# Patient Record
Sex: Male | Born: 1965 | Race: White | Hispanic: No | Marital: Married | Smoking: Former smoker
Health system: Southern US, Community
[De-identification: ages and names within clinical notes are randomized; demographics above are authoritative.]

## PROBLEM LIST (undated history)

## (undated) DIAGNOSIS — I509 Heart failure, unspecified: Secondary | ICD-10-CM

## (undated) DIAGNOSIS — I10 Essential (primary) hypertension: Secondary | ICD-10-CM

## (undated) DIAGNOSIS — E785 Hyperlipidemia, unspecified: Secondary | ICD-10-CM

## (undated) DIAGNOSIS — E119 Type 2 diabetes mellitus without complications: Secondary | ICD-10-CM

## (undated) HISTORY — DX: Type 2 diabetes mellitus without complications: E11.9

## (undated) HISTORY — DX: Heart failure, unspecified: I50.9

## (undated) HISTORY — DX: Hyperlipidemia, unspecified: E78.5

## (undated) HISTORY — PX: NO PAST SURGERIES: SHX2092

---

## 2010-09-14 ENCOUNTER — Emergency Department (HOSPITAL_COMMUNITY)
Admission: EM | Admit: 2010-09-14 | Discharge: 2010-09-14 | Disposition: A | Discharge status: Home / Self Care | Payer: BC Managed Care – PPO | Attending: Emergency Medicine | Admitting: Emergency Medicine

## 2010-09-14 DIAGNOSIS — I498 Other specified cardiac arrhythmias: Secondary | ICD-10-CM | POA: Insufficient documentation

## 2010-09-14 DIAGNOSIS — R42 Dizziness and giddiness: Secondary | ICD-10-CM | POA: Insufficient documentation

## 2010-09-14 DIAGNOSIS — R55 Syncope and collapse: Secondary | ICD-10-CM | POA: Insufficient documentation

## 2010-09-14 LAB — CBC
Platelets: 185 10*3/uL (ref 150–400)
RDW: 11.5 % (ref 11.5–15.5)
WBC: 9.8 10*3/uL (ref 4.0–10.5)

## 2010-09-14 LAB — DIFFERENTIAL
Basophils Absolute: 0 10*3/uL (ref 0.0–0.1)
Basophils Relative: 0 % (ref 0–1)
Eosinophils Absolute: 0.1 10*3/uL (ref 0.0–0.7)
Eosinophils Relative: 1 % (ref 0–5)
Lymphocytes Relative: 17 % (ref 12–46)

## 2010-09-14 LAB — BASIC METABOLIC PANEL
Chloride: 101 mEq/L (ref 96–112)
GFR calc Af Amer: >60 mL/min (ref >60)
GFR calc non Af Amer: >60 mL/min (ref >60)
Potassium: 3.9 mEq/L (ref 3.5–5.1)
Sodium: 138 mEq/L (ref 135–145)

## 2014-03-21 ENCOUNTER — Emergency Department (HOSPITAL_COMMUNITY): Payer: BC Managed Care – PPO

## 2014-03-21 ENCOUNTER — Inpatient Hospital Stay (HOSPITAL_COMMUNITY)
Admission: EM | Admit: 2014-03-21 | Discharge: 2014-03-25 | DRG: 175 | Disposition: A | Discharge status: Home / Self Care | Payer: BC Managed Care – PPO | Attending: Internal Medicine | Admitting: Internal Medicine

## 2014-03-21 ENCOUNTER — Encounter (HOSPITAL_COMMUNITY): Payer: Self-pay | Admitting: *Deleted

## 2014-03-21 DIAGNOSIS — R140 Abdominal distension (gaseous): Secondary | ICD-10-CM

## 2014-03-21 DIAGNOSIS — F17210 Nicotine dependence, cigarettes, uncomplicated: Secondary | ICD-10-CM | POA: Diagnosis present

## 2014-03-21 DIAGNOSIS — I429 Cardiomyopathy, unspecified: Secondary | ICD-10-CM | POA: Diagnosis present

## 2014-03-21 DIAGNOSIS — I2699 Other pulmonary embolism without acute cor pulmonale: Secondary | ICD-10-CM | POA: Diagnosis present

## 2014-03-21 DIAGNOSIS — E1165 Type 2 diabetes mellitus with hyperglycemia: Secondary | ICD-10-CM | POA: Diagnosis present

## 2014-03-21 DIAGNOSIS — R000 Tachycardia, unspecified: Secondary | ICD-10-CM | POA: Diagnosis present

## 2014-03-21 DIAGNOSIS — I50810 Right heart failure, unspecified: Secondary | ICD-10-CM

## 2014-03-21 DIAGNOSIS — IMO0001 Reserved for inherently not codable concepts without codable children: Secondary | ICD-10-CM | POA: Diagnosis present

## 2014-03-21 DIAGNOSIS — E119 Type 2 diabetes mellitus without complications: Secondary | ICD-10-CM

## 2014-03-21 DIAGNOSIS — I5021 Acute systolic (congestive) heart failure: Secondary | ICD-10-CM | POA: Diagnosis present

## 2014-03-21 DIAGNOSIS — F17200 Nicotine dependence, unspecified, uncomplicated: Secondary | ICD-10-CM | POA: Diagnosis present

## 2014-03-21 DIAGNOSIS — F1010 Alcohol abuse, uncomplicated: Secondary | ICD-10-CM | POA: Diagnosis present

## 2014-03-21 DIAGNOSIS — I10 Essential (primary) hypertension: Secondary | ICD-10-CM | POA: Diagnosis present

## 2014-03-21 DIAGNOSIS — R0602 Shortness of breath: Secondary | ICD-10-CM | POA: Diagnosis not present

## 2014-03-21 DIAGNOSIS — R600 Localized edema: Secondary | ICD-10-CM

## 2014-03-21 DIAGNOSIS — R042 Hemoptysis: Secondary | ICD-10-CM | POA: Diagnosis present

## 2014-03-21 DIAGNOSIS — Z86711 Personal history of pulmonary embolism: Secondary | ICD-10-CM | POA: Diagnosis present

## 2014-03-21 DIAGNOSIS — J90 Pleural effusion, not elsewhere classified: Secondary | ICD-10-CM

## 2014-03-21 HISTORY — DX: Essential (primary) hypertension: I10

## 2014-03-21 LAB — HEPATIC FUNCTION PANEL
ALBUMIN: 3.6 g/dL (ref 3.5–5.2)
ALK PHOS: 104 U/L (ref 39–117)
ALT: 38 U/L (ref 0–53)
AST: 34 U/L (ref 0–37)
BILIRUBIN INDIRECT: 0.9 mg/dL (ref 0.3–0.9)
Bilirubin, Direct: 0.6 mg/dL — ABNORMAL HIGH (ref 0.0–0.3)
TOTAL PROTEIN: 6.9 g/dL (ref 6.0–8.3)
Total Bilirubin: 1.5 mg/dL — ABNORMAL HIGH (ref 0.3–1.2)

## 2014-03-21 LAB — CBC
HCT: 46.5 % (ref 39.0–52.0)
Hemoglobin: 15.9 g/dL (ref 13.0–17.0)
MCH: 32 pg (ref 26.0–34.0)
MCHC: 34.2 g/dL (ref 30.0–36.0)
MCV: 93.6 fL (ref 78.0–100.0)
PLATELETS: 178 10*3/uL (ref 150–400)
RBC: 4.97 MIL/uL (ref 4.22–5.81)
RDW: 11.9 % (ref 11.5–15.5)
WBC: 8.9 10*3/uL (ref 4.0–10.5)

## 2014-03-21 LAB — GLUCOSE, CAPILLARY: Glucose-Capillary: 159 mg/dL — ABNORMAL HIGH (ref 70–99)

## 2014-03-21 LAB — BASIC METABOLIC PANEL
ANION GAP: 11 (ref 5–15)
BUN: 9 mg/dL (ref 6–23)
CALCIUM: 9.8 mg/dL (ref 8.4–10.5)
CO2: 25 mmol/L (ref 19–32)
CREATININE: 1.14 mg/dL (ref 0.50–1.35)
Chloride: 95 mEq/L — ABNORMAL LOW (ref 96–112)
GFR calc Af Amer: 85 mL/min — ABNORMAL LOW (ref >90)
GFR, EST NON AFRICAN AMERICAN: 73 mL/min — AB (ref >90)
Glucose, Bld: 238 mg/dL — ABNORMAL HIGH (ref 70–99)
Potassium: 3.8 mmol/L (ref 3.5–5.1)
Sodium: 131 mmol/L — ABNORMAL LOW (ref 135–145)

## 2014-03-21 LAB — ANTITHROMBIN III: AntiThromb III Func: 79 % (ref 75–120)

## 2014-03-21 LAB — I-STAT TROPONIN, ED: Troponin i, poc: 0.01 ng/mL (ref 0.00–0.08)

## 2014-03-21 LAB — BRAIN NATRIURETIC PEPTIDE: B Natriuretic Peptide: 987.2 pg/mL — ABNORMAL HIGH (ref 0.0–100.0)

## 2014-03-21 MED ORDER — LORAZEPAM 2 MG/ML IJ SOLN: 1.0000 mg | Freq: Four times a day (QID) | INTRAMUSCULAR | Status: DC | PRN

## 2014-03-21 MED ORDER — VITAMIN B-1 100 MG PO TABS
100.0000 mg | ORAL_TABLET | Freq: Every day | ORAL | Status: DC
  Administered 2014-03-24 – 2014-03-25 (×2): 100 mg via ORAL
  Filled 2014-03-21 (×4): qty 1

## 2014-03-21 MED ORDER — LORAZEPAM 2 MG/ML IJ SOLN
0.0000 mg | Freq: Two times a day (BID) | INTRAMUSCULAR | Status: DC
Start: 2014-03-23 — End: 2014-03-24

## 2014-03-21 MED ORDER — AMLODIPINE BESYLATE 5 MG PO TABS
5.0000 mg | ORAL_TABLET | Freq: Every day | ORAL | Status: DC
  Administered 2014-03-22: 5 mg via ORAL
  Filled 2014-03-21 (×2): qty 1

## 2014-03-21 MED ORDER — ADULT MULTIVITAMIN W/MINERALS CH
1.0000 | ORAL_TABLET | Freq: Every day | ORAL | Status: DC
  Administered 2014-03-22 – 2014-03-25 (×4): 1 via ORAL
  Filled 2014-03-21 (×4): qty 1

## 2014-03-21 MED ORDER — IOHEXOL 350 MG/ML SOLN
100.0000 mL | Freq: Once | INTRAVENOUS | Status: AC | PRN
  Administered 2014-03-21: 100 mL via INTRAVENOUS

## 2014-03-21 MED ORDER — THIAMINE HCL 100 MG/ML IJ SOLN
100.0000 mg | Freq: Every day | INTRAMUSCULAR | Status: DC
  Administered 2014-03-22 – 2014-03-23 (×2): 100 mg via INTRAVENOUS
  Filled 2014-03-21 (×4): qty 1

## 2014-03-21 MED ORDER — PANTOPRAZOLE SODIUM 40 MG PO TBEC
40.0000 mg | DELAYED_RELEASE_TABLET | Freq: Every day | ORAL | Status: DC
  Administered 2014-03-22 – 2014-03-25 (×4): 40 mg via ORAL
  Filled 2014-03-21 (×3): qty 1

## 2014-03-21 MED ORDER — LORAZEPAM 1 MG PO TABS
1.0000 mg | ORAL_TABLET | Freq: Four times a day (QID) | ORAL | Status: DC | PRN
  Administered 2014-03-21 – 2014-03-24 (×4): 1 mg via ORAL
  Filled 2014-03-21 (×4): qty 1

## 2014-03-21 MED ORDER — NICOTINE 14 MG/24HR TD PT24
14.0000 mg | MEDICATED_PATCH | Freq: Every day | TRANSDERMAL | Status: DC
  Filled 2014-03-21 (×5): qty 1

## 2014-03-21 MED ORDER — INSULIN ASPART 100 UNIT/ML ~~LOC~~ SOLN
0.0000 [IU] | Freq: Three times a day (TID) | SUBCUTANEOUS | Status: DC
  Administered 2014-03-22 – 2014-03-23 (×3): 2 [IU] via SUBCUTANEOUS
  Administered 2014-03-23 – 2014-03-24 (×2): 1 [IU] via SUBCUTANEOUS
  Administered 2014-03-24: 3 [IU] via SUBCUTANEOUS
  Administered 2014-03-24: 2 [IU] via SUBCUTANEOUS
  Administered 2014-03-25: 3 [IU] via SUBCUTANEOUS
  Administered 2014-03-25: 1 [IU] via SUBCUTANEOUS

## 2014-03-21 MED ORDER — HEPARIN (PORCINE) IN NACL 100-0.45 UNIT/ML-% IJ SOLN
1700.0000 [IU]/h | INTRAMUSCULAR | Status: DC
  Administered 2014-03-21: 1400 [IU]/h via INTRAVENOUS
  Administered 2014-03-22: 1700 [IU]/h via INTRAVENOUS
  Filled 2014-03-21 (×4): qty 250

## 2014-03-21 MED ORDER — IOHEXOL 300 MG/ML  SOLN
25.0000 mL | Freq: Once | INTRAMUSCULAR | Status: AC | PRN
  Administered 2014-03-21: 25 mL via ORAL

## 2014-03-21 MED ORDER — LORAZEPAM 2 MG/ML IJ SOLN: 0.0000 mg | Freq: Four times a day (QID) | INTRAMUSCULAR | Status: AC

## 2014-03-21 MED ORDER — IOHEXOL 300 MG/ML  SOLN: 100.0000 mL | Freq: Once | INTRAMUSCULAR | Status: DC | PRN

## 2014-03-21 MED ORDER — SODIUM CHLORIDE 0.9 % IJ SOLN
3.0000 mL | Freq: Two times a day (BID) | INTRAMUSCULAR | Status: DC
  Administered 2014-03-21 – 2014-03-25 (×6): 3 mL via INTRAVENOUS

## 2014-03-21 MED ORDER — HYDROCODONE-ACETAMINOPHEN 5-325 MG PO TABS: 1.0000 | ORAL_TABLET | ORAL | Status: DC | PRN

## 2014-03-21 MED ORDER — HEPARIN BOLUS VIA INFUSION
4500.0000 [IU] | Freq: Once | INTRAVENOUS | Status: AC
  Administered 2014-03-21: 4500 [IU] via INTRAVENOUS
  Filled 2014-03-21: qty 4500

## 2014-03-21 MED ORDER — FOLIC ACID 1 MG PO TABS
1.0000 mg | ORAL_TABLET | Freq: Every day | ORAL | Status: DC
  Administered 2014-03-22 – 2014-03-25 (×4): 1 mg via ORAL
  Filled 2014-03-21 (×4): qty 1

## 2014-03-21 NOTE — ED Notes (Addendum)
Sob vor x 1 week. Saw dr. On Tuesday and bp was high. He is now taking Lisinopril.  Having inc. Belly distention and swelling in left ankle. Some blood in sputum when coughing; dry cough with lisinopril.

## 2014-03-21 NOTE — ED Provider Notes (Signed)
CSN: [id number]     Arrival date & time 03/21/14  1728 History   First MD Initiated Contact with Patient 03/21/14 1735     Chief Complaint  Patient presents with  . Shortness of Breath     (Consider location/radiation/quality/duration/timing/severity/associated sxs/prior Treatment) Patient is a 48 y.o. male presenting with shortness of breath. The history is provided by the patient and medical records. No language interpreter was used.  Shortness of Breath Associated symptoms: no abdominal pain, no chest pain, no cough, no diaphoresis, no fever, no headaches, no rash, no vomiting and no wheezing      Matthew Green is a 48 y.o. male  with a hx of no major medical history presents to the Emergency Department complaining of gradual, persistent, progressively worsening dyspnea onset approximately 2 weeks ago worsening one week ago and significantly worsening 2 days ago.  Patient reports that 2 weeks ago he stopped smoking and drinking but was previously smoking greater than one pack per day and drinking a minimum of 8 beers per day. Patient reports he has no major medical problems Matthew Green has not seen a primary care physician in some time.  Patient states that 4 days ago he was seen at an urgent care, diagnosed with high blood pressure and put on lisinopril. Since that time he's had a dry cough. Patient reports that today he coughed up some clear mucus with several streaks of blood. This is not occurred again since.  Patient also endorses bilateral swelling of the legs, left worse than right. Patient reports a family history of CHF.  Patient reports the dyspnea is significantly worse with exertion.  Patient denies pain of any kind, fever, chills, headache, neck pain, chest pain, abdominal pain, nausea, vomiting, diarrhea, weakness, dizziness, syncope, dysuria, hematuria.   Patient does endorse associated abdominal distention also beginning at the same time as his other symptoms.  Patient denies recent  travel, exogenous estrogen, recent immobilization, recent surgery or recent broken bones. Patient has no history of DVT or PE.   Past Medical History  Diagnosis Date  . Hypertension    History reviewed. No pertinent past surgical history. History reviewed. No pertinent family history. History  Substance Use Topics  . Smoking status: Former Research scientist (life sciences)  . Smokeless tobacco: Not on file  . Alcohol Use: Yes    Review of Systems  Constitutional: Negative for fever, diaphoresis, appetite change, fatigue and unexpected weight change.  HENT: Negative for mouth sores.   Eyes: Negative for visual disturbance.  Respiratory: Positive for shortness of breath. Negative for cough, chest tightness and wheezing.   Cardiovascular: Positive for leg swelling. Negative for chest pain.  Gastrointestinal: Positive for abdominal distention. Negative for nausea, vomiting, abdominal pain, diarrhea and constipation.  Endocrine: Negative for polydipsia, polyphagia and polyuria.  Genitourinary: Negative for dysuria, urgency, frequency and hematuria.  Musculoskeletal: Negative for back pain and neck stiffness.  Skin: Negative for rash.  Allergic/Immunologic: Negative for immunocompromised state.  Neurological: Negative for syncope, light-headedness and headaches.  Hematological: Does not bruise/bleed easily.  Psychiatric/Behavioral: Negative for sleep disturbance. The patient is not nervous/anxious.       Allergies  Review of patient's allergies indicates no known allergies.  Home Medications   Prior to Admission medications   Medication Sig Start Date End Date Taking? Authorizing Provider  lisinopril-hydrochlorothiazide (PRINZIDE,ZESTORETIC) 10-12.5 MG per tablet Take 1 tablet by mouth daily.   Yes Historical Provider, MD   BP 130/94 mmHg  Pulse 121  Temp(Src) 98.4 F (36.9 C) (  Oral)  Resp 25  Ht 5\' 8"  (1.727 m)  Wt 200 lb (90.719 kg)  BMI 30.42 kg/m2  SpO2 95% Physical Exam  Constitutional: He  appears well-developed and well-nourished. No distress.  Awake, alert, nontoxic appearance  HENT:  Head: Normocephalic and atraumatic.  Mouth/Throat: Oropharynx is clear and moist. No oropharyngeal exudate.  Eyes: Conjunctivae are normal. No scleral icterus.  Neck: Normal range of motion. Neck supple.  Cardiovascular: Regular rhythm, normal heart sounds and intact distal pulses.   No tachycardia to 140  Pulmonary/Chest: Effort normal and breath sounds normal. No accessory muscle usage. Tachypnea noted. No respiratory distress. He has no decreased breath sounds. He has no wheezes. He has no rhonchi. He has no rales.  Equal chest expansion Clear and equal breath sounds without rales  Abdominal: Soft. Bowel sounds are normal. He exhibits distension. He exhibits no mass. There is no tenderness. There is no rebound and no guarding.  Abdomen distended and firm without tenderness No rebound or peritoneal signs  Musculoskeletal: Normal range of motion. He exhibits edema.  Bilateral 2+ pitting edema of the proximal feet and lower legs, L > R  Lymphadenopathy:    He has no cervical adenopathy.  Neurological: He is alert. He exhibits normal muscle tone. Coordination normal.  Speech is clear and goal oriented Moves extremities without ataxia  Skin: Skin is warm and dry. He is not diaphoretic. No erythema.  Psychiatric: He has a normal mood and affect.  Nursing note and vitals reviewed.   ED Course  Procedures (including critical care time) Labs Review Labs Reviewed  BASIC METABOLIC PANEL - Abnormal; Notable for the following:    Sodium 131 (*)    Chloride 95 (*)    Glucose, Bld 238 (*)    GFR calc non Af Amer 73 (*)    GFR calc Af Amer 85 (*)    All other components within normal limits  BRAIN NATRIURETIC PEPTIDE - Abnormal; Notable for the following:    B Natriuretic Peptide 987.2 (*)    All other components within normal limits  HEPATIC FUNCTION PANEL - Abnormal; Notable for the  following:    Total Bilirubin 1.5 (*)    Bilirubin, Direct 0.6 (*)    All other components within normal limits  CBC  ANTITHROMBIN III  PROTEIN C ACTIVITY  PROTEIN C, TOTAL  PROTEIN S ACTIVITY  PROTEIN S, TOTAL  LUPUS ANTICOAGULANT PANEL  BETA-2-GLYCOPROTEIN I ABS, IGG/M/A  HOMOCYSTEINE  FACTOR 5 LEIDEN  PROTHROMBIN GENE MUTATION  CARDIOLIPIN ANTIBODIES, IGG, IGM, IGA  I-STAT TROPOININ, ED    Imaging Review Dg Chest 2 View  03/21/2014   CLINICAL DATA:  Shortness of breath 1 week. Given medication 3 days ago for fluid retention and has had a dry cough since that time with hemoptysis today.  EXAM: CHEST  2 VIEW  COMPARISON:  None.  FINDINGS: Lungs are hypoinflated with blunting of the left costophrenic angle and minimal blunting of the right costophrenic angle compatible with small effusions left greater than right. Minimal prominence of the perihilar markings as cannot exclude a mild degree of vascular congestion. Cardiomediastinal silhouette is within normal. There is minimal spondylosis of the thoracic spine.  IMPRESSION: Hypoinflation with findings suggesting small bilateral pleural effusions left greater than right. Possible mild vascular congestion.   Electronically Signed   By: Marin Olp M.D.   On: 03/21/2014 18:57   Ct Angio Chest Pe W/cm &/or Wo Cm  03/21/2014   CLINICAL DATA:  Short of  breath for extend days. Abdominal distention. Ankle swelling.  EXAM: CT ANGIOGRAPHY CHEST  CT ABDOMEN AND PELVIS WITH CONTRAST  TECHNIQUE: Multidetector CT imaging of the chest was performed using the standard protocol during bolus administration of intravenous contrast. Multiplanar CT image reconstructions and MIPs were obtained to evaluate the vascular anatomy. Multidetector CT imaging of the abdomen and pelvis was performed using the standard protocol during bolus administration of intravenous contrast.  CONTRAST:  124mL OMNIPAQUE IOHEXOL 350 MG/ML SOLN  COMPARISON:  None.  FINDINGS: CTA CHEST  FINDINGS  There is a filling defect within the right lower lobe pulmonary artery consistent acute pulmonary embolism. This defect is in a proximal segmental branch (image 171, series 403). No additional filling defects within pulmonary arteries are identified. There is motion degradation the lung bases. The right ventricle to left ventricle ratio is within normal limits. No pericardial fluid. No acute findings aorta great vessels.  There are bilateral moderate to large pleural effusions. There is bibasilar fine airspace disease with some nodularity. There is some motion degradation at the lung bases.  No axillary or supraclavicular lymphadenopathy. Mild mediastinal lymphadenopathy. Several borderline enlarged prevascular nodes.  CT ABDOMEN and PELVIS FINDINGS  Hepatobiliary: No focal hepatic lesion.  Normal gallbladder.  Pancreas: The pancreas is normal.  Spleen: Normal spleen.  Adrenals/Urinary Tract: Adrenal glands, kidneys, adrenal glands and kidneys are normal. There is moderate dilatation of the distal left ureter to 18 mm in diameter (image 74). No obstructing lesion is identified. There is distension extends up to the vesicoureteral junction.  Stomach/Bowel: The stomach, small bowel, appendix, and cecum normal. The colon and rectosigmoid colon are normal.  Vascular/Lymphatic: Abdominal aorta is normal caliber. There is no retroperitoneal or periportal lymphadenopathy. No pelvic lymphadenopathy.  Reproductive:Prostate is normal.  No pelvic lymphadenopathy.  Other: Small amount free fluid surrounds the liver and spleen. Small amount free fluid the pelvis.  Musculoskeletal: No aggressive osseous lesion.  Review of the MIP images confirms the above findings.  IMPRESSION: Chest Impression:  1. Acute pulmonary embolism in the right lower lobe pulmonary artery. 2. No evidence of right ventricular strain. 3. Moderate bilateral pleural effusions. 4. There is bibasilar airspace disease with some consolidation in the  left lower lobe. Findings suggest pulmonary edema but cannot exclude left lower lobe pneumonia.  Abdomen / Pelvis Impression:  1. Small amount free fluid in the abdomen likely relates to bilateral pleural effusions. 2. Dilatation of the distal left ureter without obstructing lesion identified. Cannot exclude an occult neoplasm. Recommend nonemergent urology consultation. Critical Value/emergent results were called by telephone at the time of interpretation on 03/21/2014 at 8:52 pm to Dr. Abigail Butts , who verbally acknowledged these results.   Electronically Signed   By: Suzy Bouchard M.D.   On: 03/21/2014 20:54   Ct Abdomen Pelvis W Contrast  03/21/2014   CLINICAL DATA:  Short of breath for extend days. Abdominal distention. Ankle swelling.  EXAM: CT ANGIOGRAPHY CHEST  CT ABDOMEN AND PELVIS WITH CONTRAST  TECHNIQUE: Multidetector CT imaging of the chest was performed using the standard protocol during bolus administration of intravenous contrast. Multiplanar CT image reconstructions and MIPs were obtained to evaluate the vascular anatomy. Multidetector CT imaging of the abdomen and pelvis was performed using the standard protocol during bolus administration of intravenous contrast.  CONTRAST:  136mL OMNIPAQUE IOHEXOL 350 MG/ML SOLN  COMPARISON:  None.  FINDINGS: CTA CHEST FINDINGS  There is a filling defect within the right lower lobe pulmonary artery consistent acute pulmonary  embolism. This defect is in a proximal segmental branch (image 171, series 403). No additional filling defects within pulmonary arteries are identified. There is motion degradation the lung bases. The right ventricle to left ventricle ratio is within normal limits. No pericardial fluid. No acute findings aorta great vessels.  There are bilateral moderate to large pleural effusions. There is bibasilar fine airspace disease with some nodularity. There is some motion degradation at the lung bases.  No axillary or supraclavicular  lymphadenopathy. Mild mediastinal lymphadenopathy. Several borderline enlarged prevascular nodes.  CT ABDOMEN and PELVIS FINDINGS  Hepatobiliary: No focal hepatic lesion.  Normal gallbladder.  Pancreas: The pancreas is normal.  Spleen: Normal spleen.  Adrenals/Urinary Tract: Adrenal glands, kidneys, adrenal glands and kidneys are normal. There is moderate dilatation of the distal left ureter to 18 mm in diameter (image 74). No obstructing lesion is identified. There is distension extends up to the vesicoureteral junction.  Stomach/Bowel: The stomach, small bowel, appendix, and cecum normal. The colon and rectosigmoid colon are normal.  Vascular/Lymphatic: Abdominal aorta is normal caliber. There is no retroperitoneal or periportal lymphadenopathy. No pelvic lymphadenopathy.  Reproductive:Prostate is normal.  No pelvic lymphadenopathy.  Other: Small amount free fluid surrounds the liver and spleen. Small amount free fluid the pelvis.  Musculoskeletal: No aggressive osseous lesion.  Review of the MIP images confirms the above findings.  IMPRESSION: Chest Impression:  1. Acute pulmonary embolism in the right lower lobe pulmonary artery. 2. No evidence of right ventricular strain. 3. Moderate bilateral pleural effusions. 4. There is bibasilar airspace disease with some consolidation in the left lower lobe. Findings suggest pulmonary edema but cannot exclude left lower lobe pneumonia.  Abdomen / Pelvis Impression:  1. Small amount free fluid in the abdomen likely relates to bilateral pleural effusions. 2. Dilatation of the distal left ureter without obstructing lesion identified. Cannot exclude an occult neoplasm. Recommend nonemergent urology consultation. Critical Value/emergent results were called by telephone at the time of interpretation on 03/21/2014 at 8:52 pm to Dr. Abigail Butts , who verbally acknowledged these results.   Electronically Signed   By: Suzy Bouchard M.D.   On: 03/21/2014 20:54     EKG  Interpretation   Date/Time:  Friday March 21 2014 17:34:27 EST Ventricular Rate:  139 PR Interval:  117 QRS Duration: 77 QT Interval:  299 QTC Calculation: 455 R Axis:   106 Text Interpretation:  Sinus tachycardia Probable lateral infarct, age  indeterminate Anterior infarct, old No significant change since last  tracing Confirmed by Debby Freiberg [phone] on 03/21/2014 7:12:45 PM        MDM   Final diagnoses:  SOB (shortness of breath)  Tachycardia  Abdominal distension  Pulmonary embolism  Bilateral pleural effusion  Leg edema, left   [name] sense with dyspnea on exertion, significant tachycardia and abdominal distention approximately 2 weeks after smoking and alcohol usage cessation. Symptoms have been worsening over the last week becoming intolerable today. Patient is tachypnic  dyspneic in bed, no hypoxia.  Concern for acute abdominal ascites, ACS, PE.  Will monitor and obtain CT chest and abdomen.  7:05PM Labs reassuring. Troponin negative, EKG nonischemic.  CT chest and abdomen and BNP pending.  9:05PM BNP slightly elevated at 987.  CT with acute pulmonary embolism in the right lower lobe without evidence of right ventricular heart strain, moderate bilateral pleural effusions and bibasilar airspace disease with some consolidation in the left lower lobe with questionable pulmonary edema. Patient's abdomen pelvis with small amount  of free fluid in the abdomen and dilation of the distal left ureter without obstructing lesion.  Patient will need admission and anticoagulation.  Patient continues to be tachycardic.  9:25 PM Discussed with Dr. Blaine Hamper who will admit to stepdown.    BP 117/82 mmHg  Pulse 122  Temp(Src) 98.4 F (36.9 C) (Oral)  Resp 29  Ht 5\' 8"  (1.727 m)  Wt 200 lb (90.719 kg)  BMI 30.42 kg/m2  SpO2 98%  The patient was discussed with and seen by Dr. Colin Rhein  who agrees with the treatment plan.   Matthew Soho Keiko Myricks, PA-C 03/22/14  0101  Debby Freiberg, MD 03/24/14 [phone]

## 2014-03-21 NOTE — H&P (Signed)
[facility] History and Physical  Matthew Green HFW:[id number] DOB: [birth year] DOA: 03/21/2014  Referring physician: ED physician PCP: No primary care provider on file.  Specialists:   Chief Complaint: SOB  HPI: Matthew Green is a 48 y.o. male with past medical history of smoking, alcohol abuse, hypertension, who presents with shortness of breath.  Patient reports that he started having shortness of breath about 10 days ago. Initially he had epigastric discomfort, but no chest pain. He has dry cough, no fever or chills. His shortness of breath has been progressively getting worse. He visited the urgent care on 12/22, where he was diagnosed as hypertension, and discharged on Prinzide. After he started taking Prinzide, his cough has been worsening. He coughed up a little blood in this PM. Patient reports that he noticed his legs are swelling recently. He felt that his vein over his left medial thigh was prominent before he started having SOB.   He has been smoking 0.5 to 1.0 PAD for 12 years and drinks 6 beers very nightly. He quit both smoking and drinking on 03/11/14, he feels fine.   Patient denies fever, chills, fatigue, headaches, chest pain, diarrhea, constipation, dysuria, urgency, frequency, hematuria, skin rashes, joint pain or leg swelling.  In ED, CTA-chest showed acute pulmonary embolism in the right lower lobe pulmonary artery, no evidence of right ventricular strain, moderate bilateral pleural effusions, there is bibasilar airspace disease with some consolidation in the left lower lobe. Findings suggest pulmonary edema but cannot exclude left lower lobe pneumonia per radiologist.   CT-abd/pelvis showed small amount free fluid in the abdomen likely relates to bilateral pleural effusions, dilatation of the distal left ureter without obstructing lesion identified, cannot exclude an occult neoplasm, recommend non-emergent urology consultation by radiologist. ProBNP 987,  troponin negative, EKG showed right axis deviation. No leukocytosis. Elevated total bilirubin 1.5 (direct bilirubin 0.6).  Review of Systems: As presented in the history of presenting illness, rest negative.  Where does patient live?  At home Can patient participate in ADLs? Yes  Allergy: No Known Allergies  Past Medical History  Diagnosis Date  . Hypertension     History reviewed. No pertinent past surgical history.  Social History:  reports that he has quit smoking. He does not have any smokeless tobacco history on file. He reports that he drinks alcohol. He reports that he does not use illicit drugs.  Family History:  Family History  Problem Relation Age of Onset  . Heart failure Mother   . Heart failure Brother   . Deep vein thrombosis Father      Prior to Admission medications   Medication Sig Start Date End Date Taking? Authorizing Provider  lisinopril-hydrochlorothiazide (PRINZIDE,ZESTORETIC) 10-12.5 MG per tablet Take 1 tablet by mouth daily.   Yes Historical Provider, MD    Physical Exam: Filed Vitals:   03/21/14 1800 03/21/14 1930 03/21/14 2130 03/21/14 2200  BP: 132/98 130/94 113/78 106/83  Pulse: 127 121 124 125  Temp:      TempSrc:      Resp: 15 25 23 29   Height:      Weight:      SpO2: 94% 95% 94% 94%   General: Not in acute distress HEENT:       Eyes: PERRL, EOMI, no scleral icterus       ENT: No discharge from the ears and nose, no pharynx injection, no tonsillar enlargement.        Neck: No JVD, no bruit, no mass felt.  Cardiac: S1/S2, RRR, No murmurs, No gallops or rubs Pulm: Good air movement bilaterally. Clear to auscultation bilaterally. No rales, wheezing, rhonchi or rubs. Abd: Soft, mildly distended, nontender, no rebound pain, no organomegaly, BS present Ext: trace leg edema bilaterally. 2+DP/PT pulse bilaterally. There is mild tenderness over the leg medial thigh. Musculoskeletal: No joint deformities, erythema, or stiffness, ROM full Skin:  No rashes.  Neuro: Alert and oriented X3, cranial nerves II-XII grossly intact, muscle strength 5/5 in all extremeties, sensation to light touch intact. Brachial reflex 2+ bilaterally. Knee reflex 1+ bilaterally. Negative Babinski's sign. Normal finger to nose test. Psych: Patient is not psychotic, no suicidal or hemocidal ideation.  Labs on Admission:  Basic Metabolic Panel:  Recent Labs Lab 03/21/14 1745  NA 131*  K 3.8  CL 95*  CO2 25  GLUCOSE 238*  BUN 9  CREATININE 1.14  CALCIUM 9.8   Liver Function Tests:  Recent Labs Lab 03/21/14 1745  AST 34  ALT 38  ALKPHOS 104  BILITOT 1.5*  PROT 6.9  ALBUMIN 3.6   No results for input(s): LIPASE, AMYLASE in the last 168 hours. No results for input(s): AMMONIA in the last 168 hours. CBC:  Recent Labs Lab 03/21/14 1745  WBC 8.9  HGB 15.9  HCT 46.5  MCV 93.6  PLT 178   Cardiac Enzymes: No results for input(s): CKTOTAL, CKMB, CKMBINDEX, TROPONINI in the last 168 hours.  BNP (last 3 results) No results for input(s): PROBNP in the last 8760 hours. CBG: No results for input(s): GLUCAP in the last 168 hours.  Radiological Exams on Admission: Dg Chest 2 View  03/21/2014   CLINICAL DATA:  Shortness of breath 1 week. Given medication 3 days ago for fluid retention and has had a dry cough since that time with hemoptysis today.  EXAM: CHEST  2 VIEW  COMPARISON:  None.  FINDINGS: Lungs are hypoinflated with blunting of the left costophrenic angle and minimal blunting of the right costophrenic angle compatible with small effusions left greater than right. Minimal prominence of the perihilar markings as cannot exclude a mild degree of vascular congestion. Cardiomediastinal silhouette is within normal. There is minimal spondylosis of the thoracic spine.  IMPRESSION: Hypoinflation with findings suggesting small bilateral pleural effusions left greater than right. Possible mild vascular congestion.   Electronically Signed   By: Marin Olp M.D.   On: 03/21/2014 18:57   Ct Angio Chest Pe W/cm &/or Wo Cm  03/21/2014   CLINICAL DATA:  Short of breath for extend days. Abdominal distention. Ankle swelling.  EXAM: CT ANGIOGRAPHY CHEST  CT ABDOMEN AND PELVIS WITH CONTRAST  TECHNIQUE: Multidetector CT imaging of the chest was performed using the standard protocol during bolus administration of intravenous contrast. Multiplanar CT image reconstructions and MIPs were obtained to evaluate the vascular anatomy. Multidetector CT imaging of the abdomen and pelvis was performed using the standard protocol during bolus administration of intravenous contrast.  CONTRAST:  158mL OMNIPAQUE IOHEXOL 350 MG/ML SOLN  COMPARISON:  None.  FINDINGS: CTA CHEST FINDINGS  There is a filling defect within the right lower lobe pulmonary artery consistent acute pulmonary embolism. This defect is in a proximal segmental branch (image 171, series 403). No additional filling defects within pulmonary arteries are identified. There is motion degradation the lung bases. The right ventricle to left ventricle ratio is within normal limits. No pericardial fluid. No acute findings aorta great vessels.  There are bilateral moderate to large pleural effusions. There is bibasilar fine airspace  disease with some nodularity. There is some motion degradation at the lung bases.  No axillary or supraclavicular lymphadenopathy. Mild mediastinal lymphadenopathy. Several borderline enlarged prevascular nodes.  CT ABDOMEN and PELVIS FINDINGS  Hepatobiliary: No focal hepatic lesion.  Normal gallbladder.  Pancreas: The pancreas is normal.  Spleen: Normal spleen.  Adrenals/Urinary Tract: Adrenal glands, kidneys, adrenal glands and kidneys are normal. There is moderate dilatation of the distal left ureter to 18 mm in diameter (image 74). No obstructing lesion is identified. There is distension extends up to the vesicoureteral junction.  Stomach/Bowel: The stomach, small bowel, appendix, and cecum  normal. The colon and rectosigmoid colon are normal.  Vascular/Lymphatic: Abdominal aorta is normal caliber. There is no retroperitoneal or periportal lymphadenopathy. No pelvic lymphadenopathy.  Reproductive:Prostate is normal.  No pelvic lymphadenopathy.  Other: Small amount free fluid surrounds the liver and spleen. Small amount free fluid the pelvis.  Musculoskeletal: No aggressive osseous lesion.  Review of the MIP images confirms the above findings.  IMPRESSION: Chest Impression:  1. Acute pulmonary embolism in the right lower lobe pulmonary artery. 2. No evidence of right ventricular strain. 3. Moderate bilateral pleural effusions. 4. There is bibasilar airspace disease with some consolidation in the left lower lobe. Findings suggest pulmonary edema but cannot exclude left lower lobe pneumonia.  Abdomen / Pelvis Impression:  1. Small amount free fluid in the abdomen likely relates to bilateral pleural effusions. 2. Dilatation of the distal left ureter without obstructing lesion identified. Cannot exclude an occult neoplasm. Recommend nonemergent urology consultation. Critical Value/emergent results were called by telephone at the time of interpretation on 03/21/2014 at 8:52 pm to Dr. Abigail Butts , who verbally acknowledged these results.   Electronically Signed   By: Suzy Bouchard M.D.   On: 03/21/2014 20:54   Ct Abdomen Pelvis W Contrast  03/21/2014   CLINICAL DATA:  Short of breath for extend days. Abdominal distention. Ankle swelling.  EXAM: CT ANGIOGRAPHY CHEST  CT ABDOMEN AND PELVIS WITH CONTRAST  TECHNIQUE: Multidetector CT imaging of the chest was performed using the standard protocol during bolus administration of intravenous contrast. Multiplanar CT image reconstructions and MIPs were obtained to evaluate the vascular anatomy. Multidetector CT imaging of the abdomen and pelvis was performed using the standard protocol during bolus administration of intravenous contrast.  CONTRAST:   138mL OMNIPAQUE IOHEXOL 350 MG/ML SOLN  COMPARISON:  None.  FINDINGS: CTA CHEST FINDINGS  There is a filling defect within the right lower lobe pulmonary artery consistent acute pulmonary embolism. This defect is in a proximal segmental branch (image 171, series 403). No additional filling defects within pulmonary arteries are identified. There is motion degradation the lung bases. The right ventricle to left ventricle ratio is within normal limits. No pericardial fluid. No acute findings aorta great vessels.  There are bilateral moderate to large pleural effusions. There is bibasilar fine airspace disease with some nodularity. There is some motion degradation at the lung bases.  No axillary or supraclavicular lymphadenopathy. Mild mediastinal lymphadenopathy. Several borderline enlarged prevascular nodes.  CT ABDOMEN and PELVIS FINDINGS  Hepatobiliary: No focal hepatic lesion.  Normal gallbladder.  Pancreas: The pancreas is normal.  Spleen: Normal spleen.  Adrenals/Urinary Tract: Adrenal glands, kidneys, adrenal glands and kidneys are normal. There is moderate dilatation of the distal left ureter to 18 mm in diameter (image 74). No obstructing lesion is identified. There is distension extends up to the vesicoureteral junction.  Stomach/Bowel: The stomach, small bowel, appendix, and cecum normal. The colon and  rectosigmoid colon are normal.  Vascular/Lymphatic: Abdominal aorta is normal caliber. There is no retroperitoneal or periportal lymphadenopathy. No pelvic lymphadenopathy.  Reproductive:Prostate is normal.  No pelvic lymphadenopathy.  Other: Small amount free fluid surrounds the liver and spleen. Small amount free fluid the pelvis.  Musculoskeletal: No aggressive osseous lesion.  Review of the MIP images confirms the above findings.  IMPRESSION: Chest Impression:  1. Acute pulmonary embolism in the right lower lobe pulmonary artery. 2. No evidence of right ventricular strain. 3. Moderate bilateral pleural  effusions. 4. There is bibasilar airspace disease with some consolidation in the left lower lobe. Findings suggest pulmonary edema but cannot exclude left lower lobe pneumonia.  Abdomen / Pelvis Impression:  1. Small amount free fluid in the abdomen likely relates to bilateral pleural effusions. 2. Dilatation of the distal left ureter without obstructing lesion identified. Cannot exclude an occult neoplasm. Recommend nonemergent urology consultation. Critical Value/emergent results were called by telephone at the time of interpretation on 03/21/2014 at 8:52 pm to Dr. Abigail Butts , who verbally acknowledged these results.   Electronically Signed   By: Suzy Bouchard M.D.   On: 03/21/2014 20:54    EKG: Independently reviewed. RAD  Assessment/Plan Principal Problem:   Pulmonary embolism Active Problems:   Smoking   Alcohol abuse   HTN (hypertension)   Abdominal distension  PE: As evidenced by CTA. Patient does not have right heart straining. EKG showed RAD. Patient has tachycardia with heart rate of 120-140. He is hemodynamically stable.  -admit to stepdown for close monitoring overnight -heparin drip initiated in ED, will continue -2D echocardiogram ordered -LE dopplers ordered to evaluate for DVT -repeat EKG in a.m. -check hypercoagulable panel - trop x 3  Substance abuse: Including smoking and drinking alcohol. Patient reports that he quit drinking and smoking on 03/11/14. He is doing okay. -Encouraged not to restart smoking or drinking again. -Nicotine patch -CIWA protocol  HTN: Recently diagnosed on 12/22. patient was started with Prinzide. He reports that his cough has been worsening after he started Prinzide. It is difficult to differentiate PE-related cough from the side effects of lisinopril -We'll switch lisinopril to amlodipine  Elevated proBNP and leg edema: It is possible that patient has mild diastolic congestive heart failure. In setting of acute PE, will not  start diuretics. -pending 2D echo -Monitor volume status carefully -check lipid panel, Hb A1C, TSH -will not give more IVF  Abdominal distention: CT-abd/pelvis showed small amount free fluid in the abdomen. DD include CHF and cirrhosis secondary to alcohol abuse. Total and direct BR elevated slightly, pointing to the liver etiology. -check Hepatitis panel -check INR -Protonix -avoid tylenol  Dilatation of the distal left ureter: showed on CT-abd/pelvis,  without obstructing lesion identified, cannot exclude an occult neoplasm - recommend non-emergent urology consultation by radiologist.   DVT ppx: On IV Heparin     Code Status: Full code Family Communication:  Yes, patient's  son     at bed side Disposition Plan: Admit to inpatient   Date of Service 03/21/2014    Ivor Costa [facility] Pager [phone]  If 7PM-7AM, please contact night-coverage [email or web address] Password [facility] 03/21/2014, 10:13 PM

## 2014-03-21 NOTE — ED Notes (Signed)
Pt changed into gown, put on monitor, and given call bell. RN notified.

## 2014-03-21 NOTE — ED Notes (Signed)
CT notified that pt has finished contrast.

## 2014-03-21 NOTE — Progress Notes (Signed)
ANTICOAGULATION CONSULT NOTE - Initial Consult  Pharmacy Consult for Heparin Indication: pulmonary embolus  No Known Allergies  Patient Measurements: Height: 5\' 8"  (172.7 cm) Weight: 200 lb (90.719 kg) IBW/kg (Calculated) : 68.4 Heparin Dosing Weight: 87 kg   Vital Signs: Temp: 98.4 F (36.9 C) (12/25 1738) Temp Source: Oral (12/25 1738) BP: 113/78 mmHg (12/25 2130) Pulse Rate: 124 (12/25 2130)  Labs:  Recent Labs  03/21/14 1745  HGB 15.9  HCT 46.5  PLT 178  CREATININE 1.14    Estimated Creatinine Clearance: 84.8 mL/min (by C-G formula based on Cr of 1.14).   Medical History: Past Medical History  Diagnosis Date  . Hypertension     Assessment: 69 YOM who [facility] on [facility]/25 with SOB with imaging that revealed an acute PE in the RLL. Pharmacy was consulted to start heparin for anticoagulation. CBC wnl.   The patient has no hx CVA or recent surgery and was on no anticoagulations PTA.  Goal of Therapy:  Heparin level 0.3-0.7 units/ml Monitor platelets by anticoagulation protocol: Yes   Plan:  1. Heparin 4500 units x 1 2. Start heparin at 1400 units/hr (14 ml/hr) 3. Daily heparin levels, CBC wnl 4. Will continue to monitor for any signs/symptoms of bleeding and will follow up with heparin level in 6 hours   Alycia Rossetti, PharmD, BCPS Clinical Pharmacist Pager: [phone] 03/21/2014 9:53 PM

## 2014-03-22 DIAGNOSIS — I517 Cardiomegaly: Secondary | ICD-10-CM

## 2014-03-22 DIAGNOSIS — I2699 Other pulmonary embolism without acute cor pulmonale: Secondary | ICD-10-CM

## 2014-03-22 LAB — CBC
HEMATOCRIT: 41.7 % (ref 39.0–52.0)
Hemoglobin: 14.1 g/dL (ref 13.0–17.0)
MCH: 31.8 pg (ref 26.0–34.0)
MCHC: 33.8 g/dL (ref 30.0–36.0)
MCV: 93.9 fL (ref 78.0–100.0)
Platelets: 163 10*3/uL (ref 150–400)
RBC: 4.44 MIL/uL (ref 4.22–5.81)
RDW: 12 % (ref 11.5–15.5)
WBC: 10.3 10*3/uL (ref 4.0–10.5)

## 2014-03-22 LAB — TROPONIN I
TROPONIN I: 0.03 ng/mL (ref <0.031)
Troponin I: 0.05 ng/mL — ABNORMAL HIGH (ref <0.031)
Troponin I: 0.04 ng/mL — ABNORMAL HIGH (ref <0.031)

## 2014-03-22 LAB — COMPREHENSIVE METABOLIC PANEL
ALT: 31 U/L (ref 0–53)
ANION GAP: 9 (ref 5–15)
AST: 20 U/L (ref 0–37)
Albumin: 3 g/dL — ABNORMAL LOW (ref 3.5–5.2)
Alkaline Phosphatase: 90 U/L (ref 39–117)
BUN: 8 mg/dL (ref 6–23)
CALCIUM: 8.8 mg/dL (ref 8.4–10.5)
CO2: 23 mmol/L (ref 19–32)
CREATININE: 1.02 mg/dL (ref 0.50–1.35)
Chloride: 103 mEq/L (ref 96–112)
GFR, EST NON AFRICAN AMERICAN: 84 mL/min — AB (ref >90)
GLUCOSE: 167 mg/dL — AB (ref 70–99)
Potassium: 4 mmol/L (ref 3.5–5.1)
Sodium: 135 mmol/L (ref 135–145)
Total Bilirubin: 1.8 mg/dL — ABNORMAL HIGH (ref 0.3–1.2)
Total Protein: 5.9 g/dL — ABNORMAL LOW (ref 6.0–8.3)

## 2014-03-22 LAB — LIPID PANEL
CHOL/HDL RATIO: 4.6 ratio
CHOLESTEROL: 97 mg/dL (ref 0–200)
HDL: 21 mg/dL — ABNORMAL LOW (ref >39)
LDL Cholesterol: 64 mg/dL (ref 0–99)
Triglycerides: 60 mg/dL (ref <150)
VLDL: 12 mg/dL (ref 0–40)

## 2014-03-22 LAB — HEPATITIS PANEL, ACUTE
HCV AB: NEGATIVE
Hep A IgM: NONREACTIVE
Hep B C IgM: NONREACTIVE
Hepatitis B Surface Ag: NEGATIVE

## 2014-03-22 LAB — PROTIME-INR
INR: 1.44 (ref 0.00–1.49)
Prothrombin Time: 17.7 seconds — ABNORMAL HIGH (ref 11.6–15.2)

## 2014-03-22 LAB — GLUCOSE, CAPILLARY
GLUCOSE-CAPILLARY: 145 mg/dL — AB (ref 70–99)
GLUCOSE-CAPILLARY: 150 mg/dL — AB (ref 70–99)
Glucose-Capillary: 161 mg/dL — ABNORMAL HIGH (ref 70–99)
Glucose-Capillary: 236 mg/dL — ABNORMAL HIGH (ref 70–99)

## 2014-03-22 LAB — TSH: TSH: 1.315 u[IU]/mL (ref 0.350–4.500)

## 2014-03-22 LAB — HEPARIN LEVEL (UNFRACTIONATED)
HEPARIN UNFRACTIONATED: 0.29 [IU]/mL — AB (ref 0.30–0.70)
Heparin Unfractionated: <0.1 IU/mL — ABNORMAL LOW (ref 0.30–0.70)
Heparin Unfractionated: 0.17 IU/mL — ABNORMAL LOW (ref 0.30–0.70)

## 2014-03-22 LAB — HEMOGLOBIN A1C
Hgb A1c MFr Bld: 8.7 % — ABNORMAL HIGH (ref <5.7)
Mean Plasma Glucose: 203 mg/dL — ABNORMAL HIGH (ref <117)

## 2014-03-22 LAB — MRSA PCR SCREENING: MRSA by PCR: NEGATIVE

## 2014-03-22 LAB — HOMOCYSTEINE: Homocysteine: 11.4 umol/L (ref 4.0–15.4)

## 2014-03-22 LAB — APTT: aPTT: 177 seconds — ABNORMAL HIGH (ref 24–37)

## 2014-03-22 MED ORDER — HEPARIN BOLUS VIA INFUSION
3000.0000 [IU] | Freq: Once | INTRAVENOUS | Status: AC
  Administered 2014-03-22: 3000 [IU] via INTRAVENOUS
  Filled 2014-03-22: qty 3000

## 2014-03-22 MED ORDER — FUROSEMIDE 10 MG/ML IJ SOLN
40.0000 mg | Freq: Every day | INTRAMUSCULAR | Status: DC
  Administered 2014-03-22 – 2014-03-24 (×3): 40 mg via INTRAVENOUS
  Filled 2014-03-22 (×3): qty 4

## 2014-03-22 MED ORDER — HEPARIN (PORCINE) IN NACL 100-0.45 UNIT/ML-% IJ SOLN
2150.0000 [IU]/h | INTRAMUSCULAR | Status: DC
  Administered 2014-03-22: 2150 [IU]/h via INTRAVENOUS
  Filled 2014-03-22 (×2): qty 250

## 2014-03-22 MED ORDER — FLUTICASONE PROPIONATE 50 MCG/ACT NA SUSP
2.0000 | Freq: Every day | NASAL | Status: DC
  Administered 2014-03-22 – 2014-03-25 (×4): 2 via NASAL
  Filled 2014-03-22: qty 16

## 2014-03-22 MED ORDER — METOPROLOL TARTRATE 12.5 MG HALF TABLET
12.5000 mg | ORAL_TABLET | Freq: Two times a day (BID) | ORAL | Status: DC
  Administered 2014-03-22 (×2): 12.5 mg via ORAL
  Filled 2014-03-22 (×5): qty 1

## 2014-03-22 MED ORDER — HEPARIN BOLUS VIA INFUSION
1000.0000 [IU] | Freq: Once | INTRAVENOUS | Status: AC
  Administered 2014-03-22: 1000 [IU] via INTRAVENOUS
  Filled 2014-03-22: qty 1000

## 2014-03-22 NOTE — Progress Notes (Addendum)
[facility] PROGRESS NOTE   Matthew Green OEV:[id number] DOB: 05-Apr-1965 DOA: 03/21/2014 PCP: No primary care provider on file.  HPI/Subjective: Denies any significant complaints, cough slowed down.  Assessment/Plan: Principal Problem:   Pulmonary embolism Active Problems:   Smoking   Alcohol abuse   HTN (hypertension)   Abdominal distension    Acute PE Acute pulmonary embolism with pulmonary infarction apparent with hemoptysis. No evidence of hemodynamic stability, blood pressure stable. Markedly tachycardia, likely secondary to PE and questionable edema. Patient is on heparin drip, continue likely to switch to oral novel anticoagulant.  SOB Likely secondary to acute PE, patient has bilateral pleural effusion on the x-ray along with lower extremity edema. Cannot rule out CHF, 2-D echocardiogram to be done. I will start patient on beta blocker anyway he is tachycardic and give as needed doses of Lasix.  Dilatation of the distal left ureter Per radiology report this needs nonemergent urology evaluation. Likely will refer as outpatient.  Elevated blood sugar Likely diabetes mellitus type 2, blood sugar is 167 this morning, check hemoglobin A1c. Patient started on SSI.  Polysubstance abuse Patient used to smoke and drink, reported he quit about 2 weeks ago. Counseled extensively.  Code Status: Full code Family Communication: Plan discussed with the patient. Disposition Plan: Remains inpatient   Consultants:  None  Procedures:  None  Antibiotics:  None   Objective: Filed Vitals:   03/22/14 0731  BP: 121/95  Pulse: 113  Temp: 97.9 F (36.6 C)  Resp: 23    Intake/Output Summary (Last 24 hours) at 03/22/14 8299 Last data filed at 03/22/14 0700  Gross per 24 hour  Intake    221 ml  Output    600 ml  Net   -379 ml   Filed Weights   03/21/14 1738 03/22/14 0300  Weight: 90.719 kg (200 lb) 90.6 kg (199 lb 11.8 oz)    Exam: General:  Alert and awake, oriented x3, not in any acute distress. HEENT: anicteric sclera, pupils reactive to light and accommodation, EOMI CVS: S1-S2 clear, no murmur rubs or gallops Chest: clear to auscultation bilaterally, no wheezing, rales or rhonchi Abdomen: soft nontender, nondistended, normal bowel sounds, no organomegaly Extremities: no cyanosis, clubbing or edema noted bilaterally Neuro: Cranial nerves II-XII intact, no focal neurological deficits  Data Reviewed: Basic Metabolic Panel:  Recent Labs Lab 03/21/14 1745 03/22/14 0430  NA 131* 135  K 3.8 4.0  CL 95* 103  CO2 25 23  GLUCOSE 238* 167*  BUN 9 8  CREATININE 1.14 1.02  CALCIUM 9.8 8.8   Liver Function Tests:  Recent Labs Lab 03/21/14 1745 03/22/14 0430  AST 34 20  ALT 38 31  ALKPHOS 104 90  BILITOT 1.5* 1.8*  PROT 6.9 5.9*  ALBUMIN 3.6 3.0*   No results for input(s): LIPASE, AMYLASE in the last 168 hours. No results for input(s): AMMONIA in the last 168 hours. CBC:  Recent Labs Lab 03/21/14 1745 03/22/14 0430  WBC 8.9 10.3  HGB 15.9 14.1  HCT 46.5 41.7  MCV 93.6 93.9  PLT 178 163   Cardiac Enzymes:  Recent Labs Lab 03/21/14 2310 03/22/14 0430  TROPONINI 0.05* 0.04*   BNP (last 3 results) No results for input(s): PROBNP in the last 8760 hours. CBG:  Recent Labs Lab 03/21/14 2257  GLUCAP 159*    Micro Recent Results (from the past 240 hour(s))  MRSA PCR Screening     Status: None   Collection Time: 03/21/14 10:33 PM  Result  Value Ref Range Status   MRSA by PCR NEGATIVE NEGATIVE Final    Comment:        The GeneXpert MRSA Assay (FDA approved for NASAL specimens only), is one component of a comprehensive MRSA colonization surveillance program. It is not intended to diagnose MRSA infection nor to guide or monitor treatment for MRSA infections.      Studies: Dg Chest 2 View  03/21/2014   CLINICAL DATA:  Shortness of breath 1 week. Given medication 3 days ago for fluid  retention and has had a dry cough since that time with hemoptysis today.  EXAM: CHEST  2 VIEW  COMPARISON:  None.  FINDINGS: Lungs are hypoinflated with blunting of the left costophrenic angle and minimal blunting of the right costophrenic angle compatible with small effusions left greater than right. Minimal prominence of the perihilar markings as cannot exclude a mild degree of vascular congestion. Cardiomediastinal silhouette is within normal. There is minimal spondylosis of the thoracic spine.  IMPRESSION: Hypoinflation with findings suggesting small bilateral pleural effusions left greater than right. Possible mild vascular congestion.   Electronically Signed   By: Marin Olp M.D.   On: 03/21/2014 18:57   Ct Angio Chest Pe W/cm &/or Wo Cm  03/21/2014   CLINICAL DATA:  Short of breath for extend days. Abdominal distention. Ankle swelling.  EXAM: CT ANGIOGRAPHY CHEST  CT ABDOMEN AND PELVIS WITH CONTRAST  TECHNIQUE: Multidetector CT imaging of the chest was performed using the standard protocol during bolus administration of intravenous contrast. Multiplanar CT image reconstructions and MIPs were obtained to evaluate the vascular anatomy. Multidetector CT imaging of the abdomen and pelvis was performed using the standard protocol during bolus administration of intravenous contrast.  CONTRAST:  169mL OMNIPAQUE IOHEXOL 350 MG/ML SOLN  COMPARISON:  None.  FINDINGS: CTA CHEST FINDINGS  There is a filling defect within the right lower lobe pulmonary artery consistent acute pulmonary embolism. This defect is in a proximal segmental branch (image 171, series 403). No additional filling defects within pulmonary arteries are identified. There is motion degradation the lung bases. The right ventricle to left ventricle ratio is within normal limits. No pericardial fluid. No acute findings aorta great vessels.  There are bilateral moderate to large pleural effusions. There is bibasilar fine airspace disease with some  nodularity. There is some motion degradation at the lung bases.  No axillary or supraclavicular lymphadenopathy. Mild mediastinal lymphadenopathy. Several borderline enlarged prevascular nodes.  CT ABDOMEN and PELVIS FINDINGS  Hepatobiliary: No focal hepatic lesion.  Normal gallbladder.  Pancreas: The pancreas is normal.  Spleen: Normal spleen.  Adrenals/Urinary Tract: Adrenal glands, kidneys, adrenal glands and kidneys are normal. There is moderate dilatation of the distal left ureter to 18 mm in diameter (image 74). No obstructing lesion is identified. There is distension extends up to the vesicoureteral junction.  Stomach/Bowel: The stomach, small bowel, appendix, and cecum normal. The colon and rectosigmoid colon are normal.  Vascular/Lymphatic: Abdominal aorta is normal caliber. There is no retroperitoneal or periportal lymphadenopathy. No pelvic lymphadenopathy.  Reproductive:Prostate is normal.  No pelvic lymphadenopathy.  Other: Small amount free fluid surrounds the liver and spleen. Small amount free fluid the pelvis.  Musculoskeletal: No aggressive osseous lesion.  Review of the MIP images confirms the above findings.  IMPRESSION: Chest Impression:  1. Acute pulmonary embolism in the right lower lobe pulmonary artery. 2. No evidence of right ventricular strain. 3. Moderate bilateral pleural effusions. 4. There is bibasilar airspace disease with some consolidation in  the left lower lobe. Findings suggest pulmonary edema but cannot exclude left lower lobe pneumonia.  Abdomen / Pelvis Impression:  1. Small amount free fluid in the abdomen likely relates to bilateral pleural effusions. 2. Dilatation of the distal left ureter without obstructing lesion identified. Cannot exclude an occult neoplasm. Recommend nonemergent urology consultation. Critical Value/emergent results were called by telephone at the time of interpretation on 03/21/2014 at 8:52 pm to Dr. Abigail Butts , who verbally acknowledged these  results.   Electronically Signed   By: Suzy Bouchard M.D.   On: 03/21/2014 20:54   Ct Abdomen Pelvis W Contrast  03/21/2014   CLINICAL DATA:  Short of breath for extend days. Abdominal distention. Ankle swelling.  EXAM: CT ANGIOGRAPHY CHEST  CT ABDOMEN AND PELVIS WITH CONTRAST  TECHNIQUE: Multidetector CT imaging of the chest was performed using the standard protocol during bolus administration of intravenous contrast. Multiplanar CT image reconstructions and MIPs were obtained to evaluate the vascular anatomy. Multidetector CT imaging of the abdomen and pelvis was performed using the standard protocol during bolus administration of intravenous contrast.  CONTRAST:  162mL OMNIPAQUE IOHEXOL 350 MG/ML SOLN  COMPARISON:  None.  FINDINGS: CTA CHEST FINDINGS  There is a filling defect within the right lower lobe pulmonary artery consistent acute pulmonary embolism. This defect is in a proximal segmental branch (image 171, series 403). No additional filling defects within pulmonary arteries are identified. There is motion degradation the lung bases. The right ventricle to left ventricle ratio is within normal limits. No pericardial fluid. No acute findings aorta great vessels.  There are bilateral moderate to large pleural effusions. There is bibasilar fine airspace disease with some nodularity. There is some motion degradation at the lung bases.  No axillary or supraclavicular lymphadenopathy. Mild mediastinal lymphadenopathy. Several borderline enlarged prevascular nodes.  CT ABDOMEN and PELVIS FINDINGS  Hepatobiliary: No focal hepatic lesion.  Normal gallbladder.  Pancreas: The pancreas is normal.  Spleen: Normal spleen.  Adrenals/Urinary Tract: Adrenal glands, kidneys, adrenal glands and kidneys are normal. There is moderate dilatation of the distal left ureter to 18 mm in diameter (image 74). No obstructing lesion is identified. There is distension extends up to the vesicoureteral junction.  Stomach/Bowel: The  stomach, small bowel, appendix, and cecum normal. The colon and rectosigmoid colon are normal.  Vascular/Lymphatic: Abdominal aorta is normal caliber. There is no retroperitoneal or periportal lymphadenopathy. No pelvic lymphadenopathy.  Reproductive:Prostate is normal.  No pelvic lymphadenopathy.  Other: Small amount free fluid surrounds the liver and spleen. Small amount free fluid the pelvis.  Musculoskeletal: No aggressive osseous lesion.  Review of the MIP images confirms the above findings.  IMPRESSION: Chest Impression:  1. Acute pulmonary embolism in the right lower lobe pulmonary artery. 2. No evidence of right ventricular strain. 3. Moderate bilateral pleural effusions. 4. There is bibasilar airspace disease with some consolidation in the left lower lobe. Findings suggest pulmonary edema but cannot exclude left lower lobe pneumonia.  Abdomen / Pelvis Impression:  1. Small amount free fluid in the abdomen likely relates to bilateral pleural effusions. 2. Dilatation of the distal left ureter without obstructing lesion identified. Cannot exclude an occult neoplasm. Recommend nonemergent urology consultation. Critical Value/emergent results were called by telephone at the time of interpretation on 03/21/2014 at 8:52 pm to Dr. Abigail Butts , who verbally acknowledged these results.   Electronically Signed   By: Suzy Bouchard M.D.   On: 03/21/2014 20:54    Scheduled Meds: . amLODipine  5  mg Oral Daily  . folic acid  1 mg Oral Daily  . insulin aspart  0-9 Units Subcutaneous TID WC  . LORazepam  0-4 mg Intravenous Q6H   Followed by  . [START ON 03/23/2014] LORazepam  0-4 mg Intravenous Q12H  . multivitamin with minerals  1 tablet Oral Daily  . nicotine  14 mg Transdermal Daily  . pantoprazole  40 mg Oral Q1200  . sodium chloride  3 mL Intravenous Q12H  . thiamine  100 mg Oral Daily   Or  . thiamine  100 mg Intravenous Daily   Continuous Infusions: . heparin 1,700 Units/hr (03/22/14  0525)       Time spent: 35 minutes    [facility]  [facility] Pager [phone] If 7PM-7AM, please contact night-coverage at [email or web address], password [facility] 03/22/2014, 9:22 AM  LOS: 1 day

## 2014-03-22 NOTE — Progress Notes (Signed)
ANTICOAGULATION CONSULT NOTE - Follow Up Consult  Pharmacy Consult for Heparin  Indication: pulmonary embolus  No Known Allergies  Patient Measurements: Height: 5\' 8"  (172.7 cm) Weight: 199 lb 11.8 oz (90.6 kg) IBW/kg (Calculated) : 68.4  Vital Signs: Temp: 98.6 F (37 C) (12/26 0300) Temp Source: Oral (12/26 0300) BP: 119/70 mmHg (12/26 0400) Pulse Rate: 113 (12/26 0400)  Labs:  Recent Labs  03/21/14 1745 03/21/14 2310 03/22/14 0430  HGB 15.9  --  14.1  HCT 46.5  --  41.7  PLT 178  --  163  APTT  --  177*  --   LABPROT  --  17.7*  --   INR  --  1.44  --   HEPARINUNFRC  --   --  <0.10*  CREATININE 1.14  --  1.02  TROPONINI  --  0.05*  --     Estimated Creatinine Clearance: 94.7 mL/min (by C-G formula based on Cr of 1.02).   Assessment: Heparin for new onset PE. First HL is undetectable. Other labs as above. No issues per RN.   Goal of Therapy:  Heparin level 0.3-0.7 units/ml Monitor platelets by anticoagulation protocol: Yes   Plan:  -Heparin 3000 units BOLUS -Increase heparin drip to 1700 units/hr -1300 HL -Daily CBC/HL -Monitor for bleeding   Narda Bonds 03/22/2014,5:22 AM

## 2014-03-22 NOTE — Progress Notes (Signed)
Echocardiogram 2D Echocardiogram has been performed.  Matthew Green 03/22/2014, 1:04 PM

## 2014-03-22 NOTE — Progress Notes (Signed)
VASCULAR LAB PRELIMINARY  PRELIMINARY  PRELIMINARY  PRELIMINARY  Bilateral lower extremity venous Dopplers completed.    Preliminary report:  There is no DVT or SVT noted in the bilateral lower extremities.   Kalyn Hofstra, RVT 03/22/2014, 11:54 AM

## 2014-03-22 NOTE — Progress Notes (Addendum)
ANTICOAGULATION CONSULT NOTE - Follow Up Consult  Pharmacy Consult for Heparin Indication: pulmonary embolus  No Known Allergies  Patient Measurements: Height: 5\' 8"  (172.7 cm) Weight: 199 lb 11.8 oz (90.6 kg) IBW/kg (Calculated) : 68.4 Heparin Dosing Weight: 87 kg   Vital Signs: Temp: 98.3 F (36.8 C) (12/26 1150) Temp Source: Oral (12/26 1150) BP: 102/81 mmHg (12/26 1200) Pulse Rate: 115 (12/26 1200)  Labs:  Recent Labs  03/21/14 1745 03/21/14 2310 03/22/14 0430 03/22/14 1020 03/22/14 1330  HGB 15.9  --  14.1  --   --   HCT 46.5  --  41.7  --   --   PLT 178  --  163  --   --   APTT  --  177*  --   --   --   LABPROT  --  17.7*  --   --   --   INR  --  1.44  --   --   --   HEPARINUNFRC  --   --  <0.10*  --  0.17*  CREATININE 1.14  --  1.02  --   --   TROPONINI  --  0.05* 0.04* 0.03  --     Estimated Creatinine Clearance: 94.7 mL/min (by C-G formula based on Cr of 1.02).   Medical History: Past Medical History  Diagnosis Date  . Hypertension     Assessment: 49 YOM who presented to the [facility] on 12/25 with SOB with imaging that revealed an acute PE in the RLL. Pharmacy was consulted to start heparin for anticoagulation. HL remains SUBtherapeutic at 0.17 with slight trend up after increase in rate this am. Per nurse, no interruptions in gtt and no noted significant s/s bleeding. CBC wnl and stable.  The patient has no hx CVA or recent surgery and was on no anticoagulations PTA.  Goal of Therapy:  Heparin level 0.3-0.7 units/ml Monitor platelets by anticoagulation protocol: Yes   Plan:  1. Heparin 3000 units x 1 2. Start heparin at 2000 units/hr (increase of ~3-4 u/kg/hr) 3. 6 hour HL 4. Daily heparin levels, CBC wnl 5. Will continue to monitor for any signs/symptoms of bleeding   Erika K. Velva Harman, PharmD Clinical Pharmacist - Resident Pager: [phone] Pharmacy: [phone] 03/22/2014 2:42 PM   Addendum: F/u HL has trended up but remains  sub-therapeutic at 0.29. Per RN, there are no s/s of bleeding. Will aim for level on the upper end of goal range. Give heparin 1000 unit bolus followed increasing the heparin infusion rate to 2150 units/hr. F/u 6 hour HL at 0630 and monitor CBC and s/s of bleeding.   Albertina Parr, PharmD., BCPS Clinical Pharmacist Pager [phone]

## 2014-03-23 DIAGNOSIS — E119 Type 2 diabetes mellitus without complications: Secondary | ICD-10-CM

## 2014-03-23 DIAGNOSIS — I5021 Acute systolic (congestive) heart failure: Secondary | ICD-10-CM

## 2014-03-23 DIAGNOSIS — I2699 Other pulmonary embolism without acute cor pulmonale: Principal | ICD-10-CM

## 2014-03-23 DIAGNOSIS — I50810 Right heart failure, unspecified: Secondary | ICD-10-CM

## 2014-03-23 LAB — GLUCOSE, CAPILLARY
GLUCOSE-CAPILLARY: 194 mg/dL — AB (ref 70–99)
GLUCOSE-CAPILLARY: 162 mg/dL — AB (ref 70–99)
GLUCOSE-CAPILLARY: 195 mg/dL — AB (ref 70–99)
Glucose-Capillary: 143 mg/dL — ABNORMAL HIGH (ref 70–99)

## 2014-03-23 LAB — BASIC METABOLIC PANEL
ANION GAP: 7 (ref 5–15)
BUN: 8 mg/dL (ref 6–23)
CHLORIDE: 102 meq/L (ref 96–112)
CO2: 26 mmol/L (ref 19–32)
Calcium: 8.6 mg/dL (ref 8.4–10.5)
Creatinine, Ser: 1.01 mg/dL (ref 0.50–1.35)
GFR calc non Af Amer: 85 mL/min — ABNORMAL LOW (ref >90)
Glucose, Bld: 190 mg/dL — ABNORMAL HIGH (ref 70–99)
Potassium: 3.6 mmol/L (ref 3.5–5.1)
Sodium: 135 mmol/L (ref 135–145)

## 2014-03-23 LAB — CBC
HCT: 42.4 % (ref 39.0–52.0)
Hemoglobin: 14 g/dL (ref 13.0–17.0)
MCH: 31.3 pg (ref 26.0–34.0)
MCHC: 33 g/dL (ref 30.0–36.0)
MCV: 94.6 fL (ref 78.0–100.0)
PLATELETS: 176 10*3/uL (ref 150–400)
RBC: 4.48 MIL/uL (ref 4.22–5.81)
RDW: 12.1 % (ref 11.5–15.5)
WBC: 8.6 10*3/uL (ref 4.0–10.5)

## 2014-03-23 LAB — HEPARIN LEVEL (UNFRACTIONATED): Heparin Unfractionated: 0.33 IU/mL (ref 0.30–0.70)

## 2014-03-23 MED ORDER — POTASSIUM CHLORIDE CRYS ER 20 MEQ PO TBCR
20.0000 meq | EXTENDED_RELEASE_TABLET | Freq: Once | ORAL | Status: AC
  Administered 2014-03-23: 20 meq via ORAL
  Filled 2014-03-23: qty 1

## 2014-03-23 MED ORDER — CARVEDILOL 6.25 MG PO TABS
6.2500 mg | ORAL_TABLET | Freq: Two times a day (BID) | ORAL | Status: DC
  Administered 2014-03-23 – 2014-03-24 (×3): 6.25 mg via ORAL
  Filled 2014-03-23 (×5): qty 1

## 2014-03-23 MED ORDER — POTASSIUM CHLORIDE CRYS ER 20 MEQ PO TBCR
60.0000 meq | EXTENDED_RELEASE_TABLET | Freq: Once | ORAL | Status: AC
  Administered 2014-03-23: 60 meq via ORAL
  Filled 2014-03-23: qty 3

## 2014-03-23 MED ORDER — LISINOPRIL 2.5 MG PO TABS
2.5000 mg | ORAL_TABLET | Freq: Every day | ORAL | Status: DC
  Administered 2014-03-23 – 2014-03-25 (×3): 2.5 mg via ORAL
  Filled 2014-03-23 (×3): qty 1

## 2014-03-23 MED ORDER — HEPARIN (PORCINE) IN NACL 100-0.45 UNIT/ML-% IJ SOLN: 2250.0000 [IU]/h | INTRAMUSCULAR | Status: DC

## 2014-03-23 MED ORDER — APIXABAN 5 MG PO TABS: 5.0000 mg | ORAL_TABLET | Freq: Two times a day (BID) | ORAL | Status: DC

## 2014-03-23 MED ORDER — APIXABAN 5 MG PO TABS
10.0000 mg | ORAL_TABLET | Freq: Two times a day (BID) | ORAL | Status: DC
  Administered 2014-03-23 – 2014-03-25 (×5): 10 mg via ORAL
  Filled 2014-03-23 (×6): qty 2

## 2014-03-23 MED ORDER — FUROSEMIDE 10 MG/ML IJ SOLN
40.0000 mg | Freq: Once | INTRAMUSCULAR | Status: AC
  Administered 2014-03-23: 40 mg via INTRAVENOUS
  Filled 2014-03-23: qty 4

## 2014-03-23 NOTE — Progress Notes (Signed)
Report called to receiving nurse on unit 5W . Pt aware of pending tx to room 5W20 .

## 2014-03-23 NOTE — Consult Note (Signed)
Reason for Consult: New onset left ventricular dysfunction in a patient with pulmonary embolism  Referring Physician: Dr. Dahlia Bailiff is an 48 y.o. male.   HPI: The patient is a 48 year old man who was admitted to the hospital with sudden onset shortness of breath. He noted that he had been rabbit hunting approximately 2 weeks ago and experienced pain in his leg without any injury. Several days ago, he developed substernal chest pain and associated shortness of breath. The pain was pleuritic in nature. He was subsequently found to have pulmonary emboli. A 2-D echo has been obtained which demonstrates severe left ventricular dysfunction with an ejection fraction of 20-25%. He has mild right ventricular dysfunction as well. His ECG demonstrates right axis deviation which is new compared to his ECG 3 years ago. He has not had frank syncope. He denies peripheral edema. He has a family history of cardiomyopathy. His mother was diagnosed with congestive heart failure and left ventricular dysfunction at age 13. His younger brother sees Dr. Hayden Pedro with advanced heart failure. He denies anginal symptoms. There is no history of premature coronary disease.  PMH: Past Medical History  Diagnosis Date  . Hypertension     PSHX:History reviewed. No pertinent past surgical history.  FAMHX: Family History  Problem Relation Age of Onset  . Heart failure Mother   . Heart failure Brother   . Deep vein thrombosis Father     Social History:  reports that he has quit smoking. He does not have any smokeless tobacco history on file. He reports that he drinks alcohol. He reports that he does not use illicit drugs.  Allergies: No Known Allergies  Medications: Reviewed  Dg Chest 2 View  03/21/2014   CLINICAL DATA:  Shortness of breath 1 week. Given medication 3 days ago for fluid retention and has had a dry cough since that time with hemoptysis today.  EXAM: CHEST  2 VIEW  COMPARISON:  None.   FINDINGS: Lungs are hypoinflated with blunting of the left costophrenic angle and minimal blunting of the right costophrenic angle compatible with small effusions left greater than right. Minimal prominence of the perihilar markings as cannot exclude a mild degree of vascular congestion. Cardiomediastinal silhouette is within normal. There is minimal spondylosis of the thoracic spine.  IMPRESSION: Hypoinflation with findings suggesting small bilateral pleural effusions left greater than right. Possible mild vascular congestion.   Electronically Signed   By: Marin Olp M.D.   On: 03/21/2014 18:57   Ct Angio Chest Pe W/cm &/or Wo Cm  03/21/2014   CLINICAL DATA:  Short of breath for extend days. Abdominal distention. Ankle swelling.  EXAM: CT ANGIOGRAPHY CHEST  CT ABDOMEN AND PELVIS WITH CONTRAST  TECHNIQUE: Multidetector CT imaging of the chest was performed using the standard protocol during bolus administration of intravenous contrast. Multiplanar CT image reconstructions and MIPs were obtained to evaluate the vascular anatomy. Multidetector CT imaging of the abdomen and pelvis was performed using the standard protocol during bolus administration of intravenous contrast.  CONTRAST:  143mL OMNIPAQUE IOHEXOL 350 MG/ML SOLN  COMPARISON:  None.  FINDINGS: CTA CHEST FINDINGS  There is a filling defect within the right lower lobe pulmonary artery consistent acute pulmonary embolism. This defect is in a proximal segmental branch (image 171, series 403). No additional filling defects within pulmonary arteries are identified. There is motion degradation the lung bases. The right ventricle to left ventricle ratio is within normal limits. No pericardial fluid. No acute findings  aorta great vessels.  There are bilateral moderate to large pleural effusions. There is bibasilar fine airspace disease with some nodularity. There is some motion degradation at the lung bases.  No axillary or supraclavicular lymphadenopathy.  Mild mediastinal lymphadenopathy. Several borderline enlarged prevascular nodes.  CT ABDOMEN and PELVIS FINDINGS  Hepatobiliary: No focal hepatic lesion.  Normal gallbladder.  Pancreas: The pancreas is normal.  Spleen: Normal spleen.  Adrenals/Urinary Tract: Adrenal glands, kidneys, adrenal glands and kidneys are normal. There is moderate dilatation of the distal left ureter to 18 mm in diameter (image 74). No obstructing lesion is identified. There is distension extends up to the vesicoureteral junction.  Stomach/Bowel: The stomach, small bowel, appendix, and cecum normal. The colon and rectosigmoid colon are normal.  Vascular/Lymphatic: Abdominal aorta is normal caliber. There is no retroperitoneal or periportal lymphadenopathy. No pelvic lymphadenopathy.  Reproductive:Prostate is normal.  No pelvic lymphadenopathy.  Other: Small amount free fluid surrounds the liver and spleen. Small amount free fluid the pelvis.  Musculoskeletal: No aggressive osseous lesion.  Review of the MIP images confirms the above findings.  IMPRESSION: Chest Impression:  1. Acute pulmonary embolism in the right lower lobe pulmonary artery. 2. No evidence of right ventricular strain. 3. Moderate bilateral pleural effusions. 4. There is bibasilar airspace disease with some consolidation in the left lower lobe. Findings suggest pulmonary edema but cannot exclude left lower lobe pneumonia.  Abdomen / Pelvis Impression:  1. Small amount free fluid in the abdomen likely relates to bilateral pleural effusions. 2. Dilatation of the distal left ureter without obstructing lesion identified. Cannot exclude an occult neoplasm. Recommend nonemergent urology consultation. Critical Value/emergent results were called by telephone at the time of interpretation on 03/21/2014 at 8:52 pm to Dr. Abigail Butts , who verbally acknowledged these results.   Electronically Signed   By: Suzy Bouchard M.D.   On: 03/21/2014 20:54   Ct Abdomen Pelvis W  Contrast  03/21/2014   CLINICAL DATA:  Short of breath for extend days. Abdominal distention. Ankle swelling.  EXAM: CT ANGIOGRAPHY CHEST  CT ABDOMEN AND PELVIS WITH CONTRAST  TECHNIQUE: Multidetector CT imaging of the chest was performed using the standard protocol during bolus administration of intravenous contrast. Multiplanar CT image reconstructions and MIPs were obtained to evaluate the vascular anatomy. Multidetector CT imaging of the abdomen and pelvis was performed using the standard protocol during bolus administration of intravenous contrast.  CONTRAST:  175mL OMNIPAQUE IOHEXOL 350 MG/ML SOLN  COMPARISON:  None.  FINDINGS: CTA CHEST FINDINGS  There is a filling defect within the right lower lobe pulmonary artery consistent acute pulmonary embolism. This defect is in a proximal segmental branch (image 171, series 403). No additional filling defects within pulmonary arteries are identified. There is motion degradation the lung bases. The right ventricle to left ventricle ratio is within normal limits. No pericardial fluid. No acute findings aorta great vessels.  There are bilateral moderate to large pleural effusions. There is bibasilar fine airspace disease with some nodularity. There is some motion degradation at the lung bases.  No axillary or supraclavicular lymphadenopathy. Mild mediastinal lymphadenopathy. Several borderline enlarged prevascular nodes.  CT ABDOMEN and PELVIS FINDINGS  Hepatobiliary: No focal hepatic lesion.  Normal gallbladder.  Pancreas: The pancreas is normal.  Spleen: Normal spleen.  Adrenals/Urinary Tract: Adrenal glands, kidneys, adrenal glands and kidneys are normal. There is moderate dilatation of the distal left ureter to 18 mm in diameter (image 74). No obstructing lesion is identified. There is distension extends up  to the vesicoureteral junction.  Stomach/Bowel: The stomach, small bowel, appendix, and cecum normal. The colon and rectosigmoid colon are normal.   Vascular/Lymphatic: Abdominal aorta is normal caliber. There is no retroperitoneal or periportal lymphadenopathy. No pelvic lymphadenopathy.  Reproductive:Prostate is normal.  No pelvic lymphadenopathy.  Other: Small amount free fluid surrounds the liver and spleen. Small amount free fluid the pelvis.  Musculoskeletal: No aggressive osseous lesion.  Review of the MIP images confirms the above findings.  IMPRESSION: Chest Impression:  1. Acute pulmonary embolism in the right lower lobe pulmonary artery. 2. No evidence of right ventricular strain. 3. Moderate bilateral pleural effusions. 4. There is bibasilar airspace disease with some consolidation in the left lower lobe. Findings suggest pulmonary edema but cannot exclude left lower lobe pneumonia.  Abdomen / Pelvis Impression:  1. Small amount free fluid in the abdomen likely relates to bilateral pleural effusions. 2. Dilatation of the distal left ureter without obstructing lesion identified. Cannot exclude an occult neoplasm. Recommend nonemergent urology consultation. Critical Value/emergent results were called by telephone at the time of interpretation on 03/21/2014 at 8:52 pm to Dr. Abigail Butts , who verbally acknowledged these results.   Electronically Signed   By: Suzy Bouchard M.D.   On: 03/21/2014 20:54    ROS  As stated in the HPI and negative for all other systems.  Physical Exam  Vitals:Blood pressure 119/95, pulse 121, temperature 98 F (36.7 C), temperature source Oral, resp. rate 21, height 5\' 8"  (1.727 m), weight 195 lb 1.7 oz (88.5 kg), SpO2 97 %.  Well appearing middle-aged man, NAD HEENT: Unremarkable Neck:  7 cm JVD, no thyromegally Lymphatics:  No adenopathy Back:  No CVA tenderness Lungs:  Clear with no wheezes, rales, or rhonchi. No increased work of breathing. HEART:  Regular rate rhythm, no murmurs, no rubs, no clicks Abd:  Flat, positive bowel sounds, no organomegally, no rebound, no guarding Ext:  2 plus  pulses, no edema, no cyanosis, no clubbing Skin:  No rashes no nodules Neuro:  CN II through XII intact, motor grossly intact  ECG  - Normal sinus rhythm with rightward axis  Assessment/Plan: 1. Acute pulmonary embolism 2. New onset left ventricular dysfunction, with minimal preceding symptoms of congestive heart failure 3. Family history of nonischemic cardiomyopathy 4. Hypertension Discussion: I would recommend continued treatment of his pulmonary embolism with systemic anticoagulation. An ACE inhibitor, low-dose beta blocker would be warranted. Low-dose diuretic can be initiated as needed. As an outpatient, an exercise stress test would be recommended. With his family history of nonischemic cardiomyopathy, I would not be inclined to proceed with catheterization at this time. I suspect he will require systemic anticoagulation for at least 6 months.   Carleene Overlie TaylorMD 03/23/2014, 2:31 PM

## 2014-03-23 NOTE — Progress Notes (Addendum)
ANTICOAGULATION CONSULT NOTE - Follow Up Consult  Pharmacy Consult for Heparin transition to Eliquis Indication: pulmonary embolus  No Known Allergies  Patient Measurements: Height: 5\' 8"  (172.7 cm) Weight: 195 lb 1.7 oz (88.5 kg) IBW/kg (Calculated) : 68.4 Heparin Dosing Weight: 87 kg   Vital Signs: Temp: 98.2 F (36.8 C) (12/27 0728) Temp Source: Oral (12/27 0728) BP: 104/75 mmHg (12/27 0400) Pulse Rate: 104 (12/27 0728)  Labs:  Recent Labs  03/21/14 1745 03/21/14 2310  03/22/14 0430 03/22/14 1020 03/22/14 1330 03/22/14 2109 03/23/14 0320  HGB 15.9  --   --  14.1  --   --   --  14.0  HCT 46.5  --   --  41.7  --   --   --  42.4  PLT 178  --   --  163  --   --   --  176  APTT  --  177*  --   --   --   --   --   --   LABPROT  --  17.7*  --   --   --   --   --   --   INR  --  1.44  --   --   --   --   --   --   HEPARINUNFRC  --   --   < > <0.10*  --  0.17* 0.29* 0.33  CREATININE 1.14  --   --  1.02  --   --   --  1.01  TROPONINI  --  0.05*  --  0.04* 0.03  --   --   --   < > = values in this interval not displayed.  Estimated Creatinine Clearance: 94.6 mL/min (by C-G formula based on Cr of 1.01).   Medical History: Past Medical History  Diagnosis Date  . Hypertension     Assessment: 20 YOM who presented [facility] the [facility] with SOB with imaging that revealed an acute PE in the RLL. Pharmacy was consulted to start heparin for anticoagulation. HL is now slightly above low end of goal range at 0.33 but this was drawn earlier than scheduled and likely still being influenced by bolus so will increase slightly. Pharmacy is now consulted to transition patient to Eliquis treatment. CBC wnl and stable with no significant bleeding noted. SCr is stable at 1.01 (CrCl ~94 ml/min).  The patient has no hx CVA or recent surgery and was on no anticoagulations PTA.  Goal of Therapy:  Heparin level 0.3-0.7 units/ml Monitor platelets by anticoagulation protocol: Yes   Plan:  -  Increase heparin gtt slightly to 2250 u/hr to maintain in goal range and discontinue at time of first Eliquis dose - Initiate Eliquis at 10 mg PO bid x 7 days followed by 5 mg PO bid - Will continue to monitor CBC trend and for any signs/symptoms of bleeding   Rayshon Albaugh K. Velva Harman, PharmD Clinical Pharmacist - Resident Pager: [phone] Pharmacy: [phone] 03/23/2014 7:53 AM

## 2014-03-23 NOTE — Progress Notes (Signed)
[facility] PROGRESS NOTE   Matthew Green NOB:[id number] DOB: [birth year] DOA: 03/21/2014 PCP: No primary care provider on file.  HPI/Subjective: Feels much better, cough and shortness of breath improved. Will transfer to telemetry. No hemoptysis.  Assessment/Plan: Principal Problem:   Pulmonary embolism Active Problems:   Smoking   Alcohol abuse   HTN (hypertension)   Abdominal distension   Acute systolic CHF (congestive heart failure)   Right-sided heart failure   Diabetes mellitus without complication    Acute PE Acute pulmonary embolism with pulmonary infarction apparent with hemoptysis. No evidence of hemodynamic stability, blood pressure stable. Markedly tachycardia, likely secondary to PE and cardiomyopathy. Taken off of the heparin drip, started on Eliquis.  Acute CHF Likely secondary to acute PE, patient has bilateral pleural effusion on the x-ray along with lower extremity edema. 2-D echo showed acute systolic biventricular CHF, LVEF is 20-25% with diffuse hypokinesis Patient is started on beta blocker, low-dose ACEI and diuretics. Cardiology consulted.  Diabetes mellitus type 2, new onset Presented with hyperglycemia and hemoglobin A1c of 8.6. Patient is already on SSI, likely high-dose metformin on discharge.  Dilatation of the distal left ureter Per radiology report this needs nonemergent urology evaluation. Likely will refer as outpatient.  Polysubstance abuse Patient used to smoke and drink, reported he quit about 2 weeks ago. Counseled extensively.  Code Status: Full code Family Communication: Plan discussed with the patient. Disposition Plan: Remains inpatient   Consultants:  None  Procedures:  None  Antibiotics:  None   Objective: Filed Vitals:   03/23/14 0728  BP:   Pulse: 104  Temp: 98.2 F (36.8 C)  Resp: 27    Intake/Output Summary (Last 24 hours) at 03/23/14 1045 Last data filed at 03/23/14 0800  Gross per  24 hour  Intake  965.8 ml  Output   3225 ml  Net -2259.2 ml   Filed Weights   03/21/14 1738 03/22/14 0300 03/23/14 0400  Weight: 90.719 kg (200 lb) 90.6 kg (199 lb 11.8 oz) 88.5 kg (195 lb 1.7 oz)    Exam: General: Alert and awake, oriented x3, not in any acute distress. HEENT: anicteric sclera, pupils reactive to light and accommodation, EOMI CVS: S1-S2 clear, no murmur rubs or gallops Chest: clear to auscultation bilaterally, no wheezing, rales or rhonchi Abdomen: soft nontender, nondistended, normal bowel sounds, no organomegaly Extremities: no cyanosis, clubbing or edema noted bilaterally Neuro: Cranial nerves II-XII intact, no focal neurological deficits  Data Reviewed: Basic Metabolic Panel:  Recent Labs Lab 03/21/14 1745 03/22/14 0430 03/23/14 0320  NA 131* 135 135  K 3.8 4.0 3.6  CL 95* 103 102  CO2 25 23 26   GLUCOSE 238* 167* 190*  BUN 9 8 8   CREATININE 1.14 1.02 1.01  CALCIUM 9.8 8.8 8.6   Liver Function Tests:  Recent Labs Lab 03/21/14 1745 03/22/14 0430  AST 34 20  ALT 38 31  ALKPHOS 104 90  BILITOT 1.5* 1.8*  PROT 6.9 5.9*  ALBUMIN 3.6 3.0*   No results for input(s): LIPASE, AMYLASE in the last 168 hours. No results for input(s): AMMONIA in the last 168 hours. CBC:  Recent Labs Lab 03/21/14 1745 03/22/14 0430 03/23/14 0320  WBC 8.9 10.3 8.6  HGB 15.9 14.1 14.0  HCT 46.5 41.7 42.4  MCV 93.6 93.9 94.6  PLT 178 163 176   Cardiac Enzymes:  Recent Labs Lab 03/21/14 2310 03/22/14 0430 03/22/14 1020  TROPONINI 0.05* 0.04* 0.03   BNP (last 3 results) No results for  input(s): PROBNP in the last 8760 hours. CBG:  Recent Labs Lab 03/22/14 0730 03/22/14 1148 03/22/14 1509 03/22/14 2119 03/23/14 0727  GLUCAP 145* 150* 161* 236* 143*    Micro Recent Results (from the past 240 hour(s))  MRSA PCR Screening     Status: None   Collection Time: 03/21/14 10:33 PM  Result Value Ref Range Status   MRSA by PCR NEGATIVE NEGATIVE Final      Comment:        The GeneXpert MRSA Assay (FDA approved for NASAL specimens only), is one component of a comprehensive MRSA colonization surveillance program. It is not intended to diagnose MRSA infection nor to guide or monitor treatment for MRSA infections.      Studies: Dg Chest 2 View  03/21/2014   CLINICAL DATA:  Shortness of breath 1 week. Given medication 3 days ago for fluid retention and has had a dry cough since that time with hemoptysis today.  EXAM: CHEST  2 VIEW  COMPARISON:  None.  FINDINGS: Lungs are hypoinflated with blunting of the left costophrenic angle and minimal blunting of the right costophrenic angle compatible with small effusions left greater than right. Minimal prominence of the perihilar markings as cannot exclude a mild degree of vascular congestion. Cardiomediastinal silhouette is within normal. There is minimal spondylosis of the thoracic spine.  IMPRESSION: Hypoinflation with findings suggesting small bilateral pleural effusions left greater than right. Possible mild vascular congestion.   Electronically Signed   By: Marin Olp M.D.   On: 03/21/2014 18:57   Ct Angio Chest Pe W/cm &/or Wo Cm  03/21/2014   CLINICAL DATA:  Short of breath for extend days. Abdominal distention. Ankle swelling.  EXAM: CT ANGIOGRAPHY CHEST  CT ABDOMEN AND PELVIS WITH CONTRAST  TECHNIQUE: Multidetector CT imaging of the chest was performed using the standard protocol during bolus administration of intravenous contrast. Multiplanar CT image reconstructions and MIPs were obtained to evaluate the vascular anatomy. Multidetector CT imaging of the abdomen and pelvis was performed using the standard protocol during bolus administration of intravenous contrast.  CONTRAST:  169mL OMNIPAQUE IOHEXOL 350 MG/ML SOLN  COMPARISON:  None.  FINDINGS: CTA CHEST FINDINGS  There is a filling defect within the right lower lobe pulmonary artery consistent acute pulmonary embolism. This defect is in a  proximal segmental branch (image 171, series 403). No additional filling defects within pulmonary arteries are identified. There is motion degradation the lung bases. The right ventricle to left ventricle ratio is within normal limits. No pericardial fluid. No acute findings aorta great vessels.  There are bilateral moderate to large pleural effusions. There is bibasilar fine airspace disease with some nodularity. There is some motion degradation at the lung bases.  No axillary or supraclavicular lymphadenopathy. Mild mediastinal lymphadenopathy. Several borderline enlarged prevascular nodes.  CT ABDOMEN and PELVIS FINDINGS  Hepatobiliary: No focal hepatic lesion.  Normal gallbladder.  Pancreas: The pancreas is normal.  Spleen: Normal spleen.  Adrenals/Urinary Tract: Adrenal glands, kidneys, adrenal glands and kidneys are normal. There is moderate dilatation of the distal left ureter to 18 mm in diameter (image 74). No obstructing lesion is identified. There is distension extends up to the vesicoureteral junction.  Stomach/Bowel: The stomach, small bowel, appendix, and cecum normal. The colon and rectosigmoid colon are normal.  Vascular/Lymphatic: Abdominal aorta is normal caliber. There is no retroperitoneal or periportal lymphadenopathy. No pelvic lymphadenopathy.  Reproductive:Prostate is normal.  No pelvic lymphadenopathy.  Other: Small amount free fluid surrounds the liver and spleen.  Small amount free fluid the pelvis.  Musculoskeletal: No aggressive osseous lesion.  Review of the MIP images confirms the above findings.  IMPRESSION: Chest Impression:  1. Acute pulmonary embolism in the right lower lobe pulmonary artery. 2. No evidence of right ventricular strain. 3. Moderate bilateral pleural effusions. 4. There is bibasilar airspace disease with some consolidation in the left lower lobe. Findings suggest pulmonary edema but cannot exclude left lower lobe pneumonia.  Abdomen / Pelvis Impression:  1. Small  amount free fluid in the abdomen likely relates to bilateral pleural effusions. 2. Dilatation of the distal left ureter without obstructing lesion identified. Cannot exclude an occult neoplasm. Recommend nonemergent urology consultation. Critical Value/emergent results were called by telephone at the time of interpretation on 03/21/2014 at 8:52 pm to Dr. Abigail Butts , who verbally acknowledged these results.   Electronically Signed   By: Suzy Bouchard M.D.   On: 03/21/2014 20:54   Ct Abdomen Pelvis W Contrast  03/21/2014   CLINICAL DATA:  Short of breath for extend days. Abdominal distention. Ankle swelling.  EXAM: CT ANGIOGRAPHY CHEST  CT ABDOMEN AND PELVIS WITH CONTRAST  TECHNIQUE: Multidetector CT imaging of the chest was performed using the standard protocol during bolus administration of intravenous contrast. Multiplanar CT image reconstructions and MIPs were obtained to evaluate the vascular anatomy. Multidetector CT imaging of the abdomen and pelvis was performed using the standard protocol during bolus administration of intravenous contrast.  CONTRAST:  11mL OMNIPAQUE IOHEXOL 350 MG/ML SOLN  COMPARISON:  None.  FINDINGS: CTA CHEST FINDINGS  There is a filling defect within the right lower lobe pulmonary artery consistent acute pulmonary embolism. This defect is in a proximal segmental branch (image 171, series 403). No additional filling defects within pulmonary arteries are identified. There is motion degradation the lung bases. The right ventricle to left ventricle ratio is within normal limits. No pericardial fluid. No acute findings aorta great vessels.  There are bilateral moderate to large pleural effusions. There is bibasilar fine airspace disease with some nodularity. There is some motion degradation at the lung bases.  No axillary or supraclavicular lymphadenopathy. Mild mediastinal lymphadenopathy. Several borderline enlarged prevascular nodes.  CT ABDOMEN and PELVIS FINDINGS   Hepatobiliary: No focal hepatic lesion.  Normal gallbladder.  Pancreas: The pancreas is normal.  Spleen: Normal spleen.  Adrenals/Urinary Tract: Adrenal glands, kidneys, adrenal glands and kidneys are normal. There is moderate dilatation of the distal left ureter to 18 mm in diameter (image 74). No obstructing lesion is identified. There is distension extends up to the vesicoureteral junction.  Stomach/Bowel: The stomach, small bowel, appendix, and cecum normal. The colon and rectosigmoid colon are normal.  Vascular/Lymphatic: Abdominal aorta is normal caliber. There is no retroperitoneal or periportal lymphadenopathy. No pelvic lymphadenopathy.  Reproductive:Prostate is normal.  No pelvic lymphadenopathy.  Other: Small amount free fluid surrounds the liver and spleen. Small amount free fluid the pelvis.  Musculoskeletal: No aggressive osseous lesion.  Review of the MIP images confirms the above findings.  IMPRESSION: Chest Impression:  1. Acute pulmonary embolism in the right lower lobe pulmonary artery. 2. No evidence of right ventricular strain. 3. Moderate bilateral pleural effusions. 4. There is bibasilar airspace disease with some consolidation in the left lower lobe. Findings suggest pulmonary edema but cannot exclude left lower lobe pneumonia.  Abdomen / Pelvis Impression:  1. Small amount free fluid in the abdomen likely relates to bilateral pleural effusions. 2. Dilatation of the distal left ureter without obstructing lesion identified. Cannot  exclude an occult neoplasm. Recommend nonemergent urology consultation. Critical Value/emergent results were called by telephone at the time of interpretation on 03/21/2014 at 8:52 pm to Dr. Abigail Butts , who verbally acknowledged these results.   Electronically Signed   By: Suzy Bouchard M.D.   On: 03/21/2014 20:54    Scheduled Meds: . apixaban  10 mg Oral BID   Followed by  . [START ON 03/30/2014] apixaban  5 mg Oral BID  . carvedilol  6.25 mg Oral  BID WC  . fluticasone  2 spray Each Nare Daily  . folic acid  1 mg Oral Daily  . furosemide  40 mg Intravenous Daily  . insulin aspart  0-9 Units Subcutaneous TID WC  . lisinopril  2.5 mg Oral Daily  . LORazepam  0-4 mg Intravenous Q6H   Followed by  . LORazepam  0-4 mg Intravenous Q12H  . multivitamin with minerals  1 tablet Oral Daily  . nicotine  14 mg Transdermal Daily  . pantoprazole  40 mg Oral Q1200  . sodium chloride  3 mL Intravenous Q12H  . thiamine  100 mg Oral Daily   Or  . thiamine  100 mg Intravenous Daily   Continuous Infusions:       Time spent: 35 minutes    [facility] A  [facility] Pager [phone] If 7PM-7AM, please contact night-coverage at [email or web address], password [facility] 03/23/2014, 10:45 AM  LOS: 2 days

## 2014-03-24 DIAGNOSIS — E119 Type 2 diabetes mellitus without complications: Secondary | ICD-10-CM

## 2014-03-24 DIAGNOSIS — F1010 Alcohol abuse, uncomplicated: Secondary | ICD-10-CM

## 2014-03-24 DIAGNOSIS — J90 Pleural effusion, not elsewhere classified: Secondary | ICD-10-CM

## 2014-03-24 DIAGNOSIS — Z720 Tobacco use: Secondary | ICD-10-CM

## 2014-03-24 DIAGNOSIS — I509 Heart failure, unspecified: Secondary | ICD-10-CM

## 2014-03-24 DIAGNOSIS — I10 Essential (primary) hypertension: Secondary | ICD-10-CM

## 2014-03-24 LAB — GLUCOSE, CAPILLARY
GLUCOSE-CAPILLARY: 148 mg/dL — AB (ref 70–99)
GLUCOSE-CAPILLARY: 250 mg/dL — AB (ref 70–99)
GLUCOSE-CAPILLARY: 153 mg/dL — AB (ref 70–99)
Glucose-Capillary: 166 mg/dL — ABNORMAL HIGH (ref 70–99)

## 2014-03-24 LAB — LUPUS ANTICOAGULANT PANEL
DRVVT: 28.6 secs (ref <42.9)
Lupus Anticoagulant: NOT DETECTED
PTT LA: 38.2 s (ref 28.0–43.0)

## 2014-03-24 LAB — BASIC METABOLIC PANEL
Anion gap: 10 (ref 5–15)
BUN: 17 mg/dL (ref 6–23)
CHLORIDE: 100 meq/L (ref 96–112)
CO2: 25 mmol/L (ref 19–32)
Calcium: 8.7 mg/dL (ref 8.4–10.5)
Creatinine, Ser: 1.18 mg/dL (ref 0.50–1.35)
GFR calc non Af Amer: 70 mL/min — ABNORMAL LOW (ref >90)
GFR, EST AFRICAN AMERICAN: 82 mL/min — AB (ref >90)
Glucose, Bld: 146 mg/dL — ABNORMAL HIGH (ref 70–99)
Potassium: 4.2 mmol/L (ref 3.5–5.1)
Sodium: 135 mmol/L (ref 135–145)

## 2014-03-24 LAB — CBC
HEMATOCRIT: 43.5 % (ref 39.0–52.0)
Hemoglobin: 14.4 g/dL (ref 13.0–17.0)
MCH: 31.9 pg (ref 26.0–34.0)
MCHC: 33.1 g/dL (ref 30.0–36.0)
MCV: 96.2 fL (ref 78.0–100.0)
PLATELETS: 194 10*3/uL (ref 150–400)
RBC: 4.52 MIL/uL (ref 4.22–5.81)
RDW: 12.1 % (ref 11.5–15.5)
WBC: 8.6 10*3/uL (ref 4.0–10.5)

## 2014-03-24 LAB — MAGNESIUM: Magnesium: 2.1 mg/dL (ref 1.5–2.5)

## 2014-03-24 LAB — PROTEIN S ACTIVITY: Protein S Activity: 86 % (ref 69–129)

## 2014-03-24 LAB — PROTEIN C ACTIVITY: Protein C Activity: 56 % — ABNORMAL LOW (ref 75–133)

## 2014-03-24 MED ORDER — APIXABAN 5 MG PO TABS: 5.0000 mg | ORAL_TABLET | Freq: Two times a day (BID) | ORAL | Status: DC

## 2014-03-24 MED ORDER — LIVING WELL WITH DIABETES BOOK
Freq: Once | Status: AC
  Administered 2014-03-24: 17:00:00
  Filled 2014-03-24: qty 1

## 2014-03-24 MED ORDER — METFORMIN HCL 1000 MG PO TABS: 1000.0000 mg | ORAL_TABLET | Freq: Two times a day (BID) | ORAL | Status: DC

## 2014-03-24 MED ORDER — FUROSEMIDE 20 MG PO TABS: 20.0000 mg | ORAL_TABLET | Freq: Every day | ORAL | Status: DC

## 2014-03-24 MED ORDER — SPIRONOLACTONE 12.5 MG HALF TABLET
12.5000 mg | ORAL_TABLET | Freq: Every day | ORAL | Status: DC
  Administered 2014-03-24 – 2014-03-25 (×2): 12.5 mg via ORAL
  Filled 2014-03-24 (×2): qty 1

## 2014-03-24 MED ORDER — FUROSEMIDE 20 MG PO TABS
20.0000 mg | ORAL_TABLET | Freq: Every day | ORAL | Status: DC
  Filled 2014-03-24: qty 1

## 2014-03-24 MED ORDER — LISINOPRIL 2.5 MG PO TABS: 2.5000 mg | ORAL_TABLET | Freq: Every day | ORAL | Status: DC

## 2014-03-24 MED ORDER — FUROSEMIDE 10 MG/ML IJ SOLN
80.0000 mg | Freq: Two times a day (BID) | INTRAMUSCULAR | Status: DC
  Administered 2014-03-24 – 2014-03-25 (×2): 80 mg via INTRAVENOUS
  Filled 2014-03-24 (×4): qty 8

## 2014-03-24 MED ORDER — CARVEDILOL 3.125 MG PO TABS
3.1250 mg | ORAL_TABLET | Freq: Two times a day (BID) | ORAL | Status: DC
  Administered 2014-03-24 – 2014-03-25 (×2): 3.125 mg via ORAL
  Filled 2014-03-24 (×4): qty 1

## 2014-03-24 MED ORDER — DIGOXIN 125 MCG PO TABS
0.1250 mg | ORAL_TABLET | Freq: Every day | ORAL | Status: DC
  Administered 2014-03-24 – 2014-03-25 (×2): 0.125 mg via ORAL
  Filled 2014-03-24 (×2): qty 1

## 2014-03-24 MED ORDER — ZOLPIDEM TARTRATE 5 MG PO TABS: 5.0000 mg | ORAL_TABLET | Freq: Every evening | ORAL | Status: DC | PRN

## 2014-03-24 MED ORDER — CARVEDILOL 6.25 MG PO TABS: 6.2500 mg | ORAL_TABLET | Freq: Two times a day (BID) | ORAL | Status: DC

## 2014-03-24 NOTE — Progress Notes (Deleted)
  Asked by family to see Mr Zawislak due to new onset heart failure. Also his brother Aimee Timmons is actively followed in the heart failure clinic for NICM.   Dr Marlou Porch is aware of the above and agreeable for the HF team to follow in house and as an outpatient.   Admitted with dyspnea that was found to be from PE and new onset systolic heart failure EF 20-25%. He is being anticoagulated with eliquis.   From HF perspective he has been managed with low dose beta blocker, lasix, and low dose lisinopril. Weight down 5 pounds from admit.   Today he is dyspneic at rest and elevated JVP is noted. + Orthopnea.  Would benefit from another day to diurese. Give IV lasix now and in am. Watch BP closely. Renal function is stable. Check BMET in am. Cut back carvedilol to 3.125 mg twice a day. Add digoxin 0.125 mg daily.   Plan was to check stress test as an outpatient.   Will ask cardiac rehab to see prior  discharge as well as HF navigator.   I have contacted Dr Hartford Poli and requested additional day to diurese and he has agreed.   Will reassess this afternoon.   CLEGG,AMY NP-C  2:23 PM

## 2014-03-24 NOTE — Discharge Summary (Signed)
Physician Discharge Summary  STARLIN STEIB ZOX:[id number] DOB: 04/25/1965 DOA: 03/21/2014  PCP: No primary care provider on file.  Admit date: 03/21/2014 Discharge date: 03/24/2014  Time spent: 40 minutes  Recommendations for Outpatient Follow-up:  1. Follow-up with Dr. Marlou Porch in 1 week.  Discharge Diagnoses:  Principal Problem:   Pulmonary embolism Active Problems:   Smoking   Alcohol abuse   HTN (hypertension)   Abdominal distension   Acute systolic CHF (congestive heart failure)   Right-sided heart failure   Diabetes mellitus without complication   Discharge Condition: Stable  Diet recommendation: Heart healthy  Filed Weights   03/21/14 1738 03/22/14 0300 03/23/14 0400  Weight: 90.719 kg (200 lb) 90.6 kg (199 lb 11.8 oz) 88.5 kg (195 lb 1.7 oz)    History of present illness:  Mr. Matthew Green 48 year old Caucasian male with past medical history of smoking, alcohol abuse and hypertension came into the hospital complaining about shortness of breath. Patient also has cough and hemoptysis. Patient described mild orthopnea, and lower extremity edema. Patient CT scan showed PE in the right lower lobe artery, acute CHF with ejection fraction of 20-25%, new onset diabetes his hemoglobin A1c is 8.6.  Hospital Course:    Acute PE Acute pulmonary embolism, likely with pulmonary infarction causing hemoptysis. No evidence of hemodynamic stability, blood pressure stable. Markedly tachycardia, likely secondary to PE and cardiomyopathy. Initially was on heparin drip, discharge on Eliquis.  Acute systolic CHF, biventricular Patient was complaining about lower extremity edema and orthopnea 2-D echo showed acute systolic biventricular CHF, LVEF is 20-25% with diffuse hypokinesis Patient is started on beta blocker, low-dose ACEI and diuretics. Cardiology consulted, recommended follow-up as outpatient. Echo in 3 months, cardiac catheterization if EF continues to be in the low side.  Please note that his younger brother has dilated cardiomyopathy as well.  Diabetes mellitus type 2, new onset Presented with hyperglycemia and hemoglobin A1c of 8.6. Placed on SSI and carbohydrate modified diet while is in the hospital. Discharge on 1000 mg of metformin bilaterally.  Dilatation of the distal left ureter Per radiology report this needs nonemergent urology evaluation. Needs referral to urology as outpatient. Discussed with the patient.  Polysubstance abuse Patient used to smoke and drink, reported he quit about 2 weeks ago. Counseled extensively.  Procedures:  None  Consultations:  Cardiology  Discharge Exam: Filed Vitals:   03/24/14 0600  BP:   Pulse: 106  Temp:   Resp:    General: Alert and awake, oriented x3, not in any acute distress. HEENT: anicteric sclera, pupils reactive to light and accommodation, EOMI CVS: S1-S2 clear, no murmur rubs or gallops Chest: clear to auscultation bilaterally, no wheezing, rales or rhonchi Abdomen: soft nontender, nondistended, normal bowel sounds, no organomegaly Extremities: no cyanosis, clubbing or edema noted bilaterally Neuro: Cranial nerves II-XII intact, no focal neurological deficits  Discharge Instructions   Discharge Instructions    Diet - low sodium heart healthy    Complete by:  As directed      Increase activity slowly    Complete by:  As directed           Current Discharge Medication List    START taking these medications   Details  apixaban (ELIQUIS) 5 MG TABS tablet Take 1 tablet (5 mg total) by mouth 2 (two) times daily. Qty: 74 tablet, Refills: 0    carvedilol (COREG) 6.25 MG tablet Take 1 tablet (6.25 mg total) by mouth 2 (two) times daily with a meal. Qty: 60  tablet, Refills: 0    furosemide (LASIX) 20 MG tablet Take 1 tablet (20 mg total) by mouth daily. Qty: 30 tablet, Refills: 0    lisinopril (PRINIVIL,ZESTRIL) 2.5 MG tablet Take 1 tablet (2.5 mg total) by mouth daily. Qty: 30  tablet, Refills: 0    metFORMIN (GLUCOPHAGE) 1000 MG tablet Take 1 tablet (1,000 mg total) by mouth 2 (two) times daily with a meal. Qty: 60 tablet, Refills: 0      STOP taking these medications     lisinopril-hydrochlorothiazide (PRINZIDE,ZESTORETIC) 10-12.5 MG per tablet        No Known Allergies Follow-up Information    Follow up with Candee Furbish, MD In 1 week.   Specialty:  Cardiology   Contact information:   [address] [place] [address] [phone]        The results of significant diagnostics from this hospitalization (including imaging, microbiology, ancillary and laboratory) are listed below for reference.    Significant Diagnostic Studies: Dg Chest 2 View  03/21/2014   CLINICAL DATA:  Shortness of breath 1 week. Given medication 3 days ago for fluid retention and has had a dry cough since that time with hemoptysis today.  EXAM: CHEST  2 VIEW  COMPARISON:  None.  FINDINGS: Lungs are hypoinflated with blunting of the left costophrenic angle and minimal blunting of the right costophrenic angle compatible with small effusions left greater than right. Minimal prominence of the perihilar markings as cannot exclude a mild degree of vascular congestion. Cardiomediastinal silhouette is within normal. There is minimal spondylosis of the thoracic spine.  IMPRESSION: Hypoinflation with findings suggesting small bilateral pleural effusions left greater than right. Possible mild vascular congestion.   Electronically Signed   By: Marin Olp M.D.   On: 03/21/2014 18:57   Ct Angio Chest Pe W/cm &/or Wo Cm  03/21/2014   CLINICAL DATA:  Short of breath for extend days. Abdominal distention. Ankle swelling.  EXAM: CT ANGIOGRAPHY CHEST  CT ABDOMEN AND PELVIS WITH CONTRAST  TECHNIQUE: Multidetector CT imaging of the chest was performed using the standard protocol during bolus administration of intravenous contrast. Multiplanar CT image reconstructions and MIPs were  obtained to evaluate the vascular anatomy. Multidetector CT imaging of the abdomen and pelvis was performed using the standard protocol during bolus administration of intravenous contrast.  CONTRAST:  135mL OMNIPAQUE IOHEXOL 350 MG/ML SOLN  COMPARISON:  None.  FINDINGS: CTA CHEST FINDINGS  There is a filling defect within the right lower lobe pulmonary artery consistent acute pulmonary embolism. This defect is in a proximal segmental branch (image 171, series 403). No additional filling defects within pulmonary arteries are identified. There is motion degradation the lung bases. The right ventricle to left ventricle ratio is within normal limits. No pericardial fluid. No acute findings aorta great vessels.  There are bilateral moderate to large pleural effusions. There is bibasilar fine airspace disease with some nodularity. There is some motion degradation at the lung bases.  No axillary or supraclavicular lymphadenopathy. Mild mediastinal lymphadenopathy. Several borderline enlarged prevascular nodes.  CT ABDOMEN and PELVIS FINDINGS  Hepatobiliary: No focal hepatic lesion.  Normal gallbladder.  Pancreas: The pancreas is normal.  Spleen: Normal spleen.  Adrenals/Urinary Tract: Adrenal glands, kidneys, adrenal glands and kidneys are normal. There is moderate dilatation of the distal left ureter to 18 mm in diameter (image 74). No obstructing lesion is identified. There is distension extends up to the vesicoureteral junction.  Stomach/Bowel: The stomach, small bowel, appendix, and  cecum normal. The colon and rectosigmoid colon are normal.  Vascular/Lymphatic: Abdominal aorta is normal caliber. There is no retroperitoneal or periportal lymphadenopathy. No pelvic lymphadenopathy.  Reproductive:Prostate is normal.  No pelvic lymphadenopathy.  Other: Small amount free fluid surrounds the liver and spleen. Small amount free fluid the pelvis.  Musculoskeletal: No aggressive osseous lesion.  Review of the MIP images  confirms the above findings.  IMPRESSION: Chest Impression:  1. Acute pulmonary embolism in the right lower lobe pulmonary artery. 2. No evidence of right ventricular strain. 3. Moderate bilateral pleural effusions. 4. There is bibasilar airspace disease with some consolidation in the left lower lobe. Findings suggest pulmonary edema but cannot exclude left lower lobe pneumonia.  Abdomen / Pelvis Impression:  1. Small amount free fluid in the abdomen likely relates to bilateral pleural effusions. 2. Dilatation of the distal left ureter without obstructing lesion identified. Cannot exclude an occult neoplasm. Recommend nonemergent urology consultation. Critical Value/emergent results were called by telephone at the time of interpretation on 03/21/2014 at 8:52 pm to Dr. Abigail Butts , who verbally acknowledged these results.   Electronically Signed   By: Suzy Bouchard M.D.   On: 03/21/2014 20:54   Ct Abdomen Pelvis W Contrast  03/21/2014   CLINICAL DATA:  Short of breath for extend days. Abdominal distention. Ankle swelling.  EXAM: CT ANGIOGRAPHY CHEST  CT ABDOMEN AND PELVIS WITH CONTRAST  TECHNIQUE: Multidetector CT imaging of the chest was performed using the standard protocol during bolus administration of intravenous contrast. Multiplanar CT image reconstructions and MIPs were obtained to evaluate the vascular anatomy. Multidetector CT imaging of the abdomen and pelvis was performed using the standard protocol during bolus administration of intravenous contrast.  CONTRAST:  11mL OMNIPAQUE IOHEXOL 350 MG/ML SOLN  COMPARISON:  None.  FINDINGS: CTA CHEST FINDINGS  There is a filling defect within the right lower lobe pulmonary artery consistent acute pulmonary embolism. This defect is in a proximal segmental branch (image 171, series 403). No additional filling defects within pulmonary arteries are identified. There is motion degradation the lung bases. The right ventricle to left ventricle ratio is  within normal limits. No pericardial fluid. No acute findings aorta great vessels.  There are bilateral moderate to large pleural effusions. There is bibasilar fine airspace disease with some nodularity. There is some motion degradation at the lung bases.  No axillary or supraclavicular lymphadenopathy. Mild mediastinal lymphadenopathy. Several borderline enlarged prevascular nodes.  CT ABDOMEN and PELVIS FINDINGS  Hepatobiliary: No focal hepatic lesion.  Normal gallbladder.  Pancreas: The pancreas is normal.  Spleen: Normal spleen.  Adrenals/Urinary Tract: Adrenal glands, kidneys, adrenal glands and kidneys are normal. There is moderate dilatation of the distal left ureter to 18 mm in diameter (image 74). No obstructing lesion is identified. There is distension extends up to the vesicoureteral junction.  Stomach/Bowel: The stomach, small bowel, appendix, and cecum normal. The colon and rectosigmoid colon are normal.  Vascular/Lymphatic: Abdominal aorta is normal caliber. There is no retroperitoneal or periportal lymphadenopathy. No pelvic lymphadenopathy.  Reproductive:Prostate is normal.  No pelvic lymphadenopathy.  Other: Small amount free fluid surrounds the liver and spleen. Small amount free fluid the pelvis.  Musculoskeletal: No aggressive osseous lesion.  Review of the MIP images confirms the above findings.  IMPRESSION: Chest Impression:  1. Acute pulmonary embolism in the right lower lobe pulmonary artery. 2. No evidence of right ventricular strain. 3. Moderate bilateral pleural effusions. 4. There is bibasilar airspace disease with some consolidation in the left  lower lobe. Findings suggest pulmonary edema but cannot exclude left lower lobe pneumonia.  Abdomen / Pelvis Impression:  1. Small amount free fluid in the abdomen likely relates to bilateral pleural effusions. 2. Dilatation of the distal left ureter without obstructing lesion identified. Cannot exclude an occult neoplasm. Recommend nonemergent  urology consultation. Critical Value/emergent results were called by telephone at the time of interpretation on 03/21/2014 at 8:52 pm to Dr. Abigail Butts , who verbally acknowledged these results.   Electronically Signed   By: Suzy Bouchard M.D.   On: 03/21/2014 20:54    Microbiology: Recent Results (from the past 240 hour(s))  MRSA PCR Screening     Status: None   Collection Time: 03/21/14 10:33 PM  Result Value Ref Range Status   MRSA by PCR NEGATIVE NEGATIVE Final    Comment:        The GeneXpert MRSA Assay (FDA approved for NASAL specimens only), is one component of a comprehensive MRSA colonization surveillance program. It is not intended to diagnose MRSA infection nor to guide or monitor treatment for MRSA infections.      Labs: Basic Metabolic Panel:  Recent Labs Lab 03/21/14 1745 03/22/14 0430 03/23/14 0320 03/24/14 0742  NA 131* 135 135 135  K 3.8 4.0 3.6 4.2  CL 95* 103 102 100  CO2 25 23 26 25   GLUCOSE 238* 167* 190* 146*  BUN 9 8 8 17   CREATININE 1.14 1.02 1.01 1.18  CALCIUM 9.8 8.8 8.6 8.7  MG  --   --   --  2.1   Liver Function Tests:  Recent Labs Lab 03/21/14 1745 03/22/14 0430  AST 34 20  ALT 38 31  ALKPHOS 104 90  BILITOT 1.5* 1.8*  PROT 6.9 5.9*  ALBUMIN 3.6 3.0*   No results for input(s): LIPASE, AMYLASE in the last 168 hours. No results for input(s): AMMONIA in the last 168 hours. CBC:  Recent Labs Lab 03/21/14 1745 03/22/14 0430 03/23/14 0320 03/24/14 0742  WBC 8.9 10.3 8.6 8.6  HGB 15.9 14.1 14.0 14.4  HCT 46.5 41.7 42.4 43.5  MCV 93.6 93.9 94.6 96.2  PLT 178 163 176 194   Cardiac Enzymes:  Recent Labs Lab 03/21/14 2310 03/22/14 0430 03/22/14 1020  TROPONINI 0.05* 0.04* 0.03   BNP: BNP (last 3 results) No results for input(s): PROBNP in the last 8760 hours. CBG:  Recent Labs Lab 03/23/14 1111 03/23/14 1721 03/23/14 2249 03/24/14 0750 03/24/14 1144  GLUCAP 194* 162* 195* 148* 250*        Signed:  Salam Chesterfield A  [facility] 03/24/2014, 11:57 AM

## 2014-03-24 NOTE — Progress Notes (Signed)
Received call from Amy, with the heart failure team recommended to keep patient in the hospital because of prominent neck vessels and SOB. Heart failure team  commended to increase Lasix, I will let them do the dosage  Check BMP in a.m.  Birdie Hopes Pager: [phone] 03/24/2014, 1:51 PM

## 2014-03-24 NOTE — Progress Notes (Addendum)
CARE MANAGEMENT NOTE 03/24/2014  Patient:  Matthew Green, Matthew Green   Account Number:  [id number]  Date Initiated:  03/24/2014  Documentation initiated by:  [facility]  Subjective/Objective Assessment:   PE, CHF, DM     Action/Plan:   Anticipated DC Date:  03/24/2014   Anticipated DC Plan:  Central  CM consult  Medication Assistance      Choice offered to / List presented to:             Status of service:  Completed, signed off Medicare Important Message given?   (If response is "NO", the following Medicare IM given date fields will be blank) Date Medicare IM given:   Medicare IM given by:   Date Additional Medicare IM given:   Additional Medicare IM given by:    Discharge Disposition:  HOME/SELF CARE  Per UR Regulation:    If discussed at Long Length of Stay Meetings, dates discussed:    Comments: 03/24/2014 1500 PER [employer] ONLINE:  MEDICATION IS ON DRUG LIST FOR 2015/ TIER 2/ CO-PAY AT RETAIL $45  Jonnie Finner RN CCM Case Mgmt phone [phone]      03/24/2014 1259  NCM spoke to pt and provided pt with an Eliquis 30 day free trial card and copay card. Jonnie Finner RN CCM Case Mgmt phone [phone]

## 2014-03-24 NOTE — Discharge Instructions (Addendum)
Heart Failure °Heart failure means your heart has trouble pumping blood. This makes it hard for your body to work well. Heart failure is usually a long-term (chronic) condition. You must take good care of yourself and follow your doctor's treatment plan. °HOME CARE °· Take your heart medicine as told by your doctor. °¨ Do not stop taking medicine unless your doctor tells you to. °¨ Do not skip any dose of medicine. °¨ Refill your medicines before they run out. °¨ Take other medicines only as told by your doctor or pharmacist. °· Stay active if told by your doctor. The elderly and people with severe heart failure should talk with a doctor about physical activity. °· Eat heart-healthy foods. Choose foods that are without trans fat and are low in saturated fat, cholesterol, and salt (sodium). This includes fresh or frozen fruits and vegetables, fish, lean meats, fat-free or low-fat dairy foods, whole grains, and high-fiber foods. Lentils and dried peas and beans (legumes) are also good choices. °· Limit salt if told by your doctor. °· Cook in a healthy way. Roast, grill, broil, bake, poach, steam, or stir-fry foods. °· Limit fluids as told by your doctor. °· Weigh yourself every morning. Do this after you pee (urinate) and before you eat breakfast. Write down your weight to give to your doctor. °· Take your blood pressure and write it down if your doctor tells you to. °· Ask your doctor how to check your pulse. Check your pulse as told. °· Lose weight if told by your doctor. °· Stop smoking or chewing tobacco. Do not use gum or patches that help you quit without your doctor's approval. °· Schedule and go to doctor visits as told. °· Nonpregnant women should have no more than 1 drink a day. Men should have no more than 2 drinks a day. Talk to your doctor about drinking alcohol. °· Stop illegal drug use. °· Stay current with shots (immunizations). °· Manage your health conditions as told by your doctor. °· Learn to  manage your stress. °· Rest when you are tired. °· If it is really hot outside: °¨ Avoid intense activities. °¨ Use air conditioning or fans, or get in a cooler place. °¨ Avoid caffeine and alcohol. °¨ Wear loose-fitting, lightweight, and light-colored clothing. °· If it is really cold outside: °¨ Avoid intense activities. °¨ Layer your clothing. °¨ Wear mittens or gloves, a hat, and a scarf when going outside. °¨ Avoid alcohol. °· Learn about heart failure and get support as needed. °· Get help to maintain or improve your quality of life and your ability to care for yourself as needed. °GET HELP IF:  °· You gain 03 lb/1.4 kg or more in 1 day or 05 lb/2.3 kg in a week. °· You are more short of breath than usual. °· You cannot do your normal activities. °· You tire easily. °· You cough more than normal, especially with activity. °· You have any or more puffiness (swelling) in areas such as your hands, feet, ankles, or belly (abdomen). °· You cannot sleep because it is hard to breathe. °· You feel like your heart is beating fast (palpitations). °· You get dizzy or light-headed when you stand up. °GET HELP RIGHT AWAY IF:  °· You have trouble breathing. °· There is a change in mental status, such as becoming less alert or not being able to focus. °· You have chest pain or discomfort. °· You faint. °MAKE SURE YOU:  °· Understand these instructions. °·   Will watch your condition.  Will get help right away if you are not doing well or get worse. Document Released: 12/22/2007 Document Revised: 07/29/2013 Document Reviewed: 04/30/2012 [facility] Patient Information 2015 [place], [place]. This information is not intended to replace advice given to you by your health care provider. Make sure you discuss any questions you have with your health care provider.    Information on my medicine - ELIQUIS (apixaban)  This medication education was reviewed with me or my healthcare representative as part of my discharge  preparation.  The pharmacist that spoke with me during my hospital stay was:  Wayland Salinas, [facility]  Why was Eliquis prescribed for you? Eliquis was prescribed to treat blood clots that may have been found in the veins of your legs (deep vein thrombosis) or in your lungs (pulmonary embolism) and to reduce the risk of them occurring again.  What do You need to know about Eliquis ? The starting dose is 10 mg (two 5 mg tablets) taken TWICE daily for the FIRST SEVEN (7) DAYS, then on (enter date)  03/30/14 the dose is reduced to ONE 5 mg tablet taken TWICE daily.  Eliquis may be taken with or without food.   Try to take the dose about the same time in the morning and in the evening. If you have difficulty swallowing the tablet whole please discuss with your pharmacist how to take the medication safely.  Take Eliquis exactly as prescribed and DO NOT stop taking Eliquis without talking to the doctor who prescribed the medication.  Stopping may increase your risk of developing a new blood clot.  Refill your prescription before you run out.  After discharge, you should have regular check-up appointments with your healthcare provider that is prescribing your Eliquis.    What do you do if you miss a dose? If a dose of ELIQUIS is not taken at the scheduled time, take it as soon as possible on the same day and twice-daily administration should be resumed. The dose should not be doubled to make up for a missed dose.  Important Safety Information A possible side effect of Eliquis is bleeding. You should call your healthcare provider right away if you experience any of the following: ? Bleeding from an injury or your nose that does not stop. ? Unusual colored urine (red or dark brown) or unusual colored stools (red or black). ? Unusual bruising for unknown reasons. ? A serious fall or if you hit your head (even if there is no bleeding).  Some medicines may interact with Eliquis and  might increase your risk of bleeding or clotting while on Eliquis. To help avoid this, consult your healthcare provider or pharmacist prior to using any new prescription or non-prescription medications, including herbals, vitamins, non-steroidal anti-inflammatory drugs (NSAIDs) and supplements.  This website has more information on Eliquis (apixaban): http://www.eliquis.com/eliquis/home

## 2014-03-24 NOTE — Progress Notes (Signed)
Patient ID: Matthew Green, male   DOB: [birth year], 48 y.o.   MRN: [id number]   Asked by family to see Matthew Green due to new onset heart failure. Also his brother Matthew Green is actively followed in the heart failure clinic for [facility].   Dr Marlou Porch is aware of the above and agreeable for the HF team to follow in house and as an outpatient.   Admitted with dyspnea that was found to be from PE and new onset systolic heart failure EF 20-25%. He is being anticoagulated with eliquis.   From HF perspective he has been managed with low dose beta blocker, lasix, and low dose lisinopril. Weight down 5 pounds from admit.   Today he is dyspneic at rest and elevated JVP is noted. + Orthopnea.    Scheduled Meds: . apixaban  10 mg Oral BID   Followed by  . [START ON 03/30/2014] apixaban  5 mg Oral BID  . carvedilol  3.125 mg Oral BID WC  . digoxin  0.125 mg Oral Daily  . fluticasone  2 spray Each Nare Daily  . folic acid  1 mg Oral Daily  . furosemide  80 mg Intravenous BID  . insulin aspart  0-9 Units Subcutaneous TID WC  . lisinopril  2.5 mg Oral Daily  . living well with diabetes book   Does not apply Once  . LORazepam  0-4 mg Intravenous Q12H  . multivitamin with minerals  1 tablet Oral Daily  . nicotine  14 mg Transdermal Daily  . pantoprazole  40 mg Oral Q1200  . sodium chloride  3 mL Intravenous Q12H  . spironolactone  12.5 mg Oral Daily  . thiamine  100 mg Oral Daily   Or  . thiamine  100 mg Intravenous Daily   Continuous Infusions:  PRN Meds:.HYDROcodone-acetaminophen, LORazepam **OR** LORazepam   Filed Vitals:   03/23/14 2057 03/24/14 0532 03/24/14 0600 03/24/14 1310  BP: 101/71 101/71  99/74  Pulse: 105 100 106 105  Temp: 97.5 F (36.4 C)   98.3 F (36.8 C)  TempSrc: Oral     Resp: 20 18    Height:    5' 7.99" (1.727 m)  Weight:    200 lb 6.4 oz (90.9 kg)  SpO2: 96% 97%  97%    Intake/Output Summary (Last 24 hours) at 03/24/14 1601 Last data filed at 03/24/14 1400  Gross per 24 hour  Intake    440 ml  Output    475 ml  Net    -35 ml    LABS: Basic Metabolic Panel:  Recent Labs  03/23/14 0320 03/24/14 0742  NA 135 135  K 3.6 4.2  CL 102 100  CO2 26 25  GLUCOSE 190* 146*  BUN 8 17  CREATININE 1.01 1.18  CALCIUM 8.6 8.7  MG  --  2.1   Liver Function Tests:  Recent Labs  03/21/14 1745 03/22/14 0430  AST 34 20  ALT 38 31  ALKPHOS 104 90  BILITOT 1.5* 1.8*  PROT 6.9 5.9*  ALBUMIN 3.6 3.0*   No results for input(s): LIPASE, AMYLASE in the last 72 hours. CBC:  Recent Labs  03/23/14 0320 03/24/14 0742  WBC 8.6 8.6  HGB 14.0 14.4  HCT 42.4 43.5  MCV 94.6 96.2  PLT 176 194   Cardiac Enzymes:  Recent Labs  03/21/14 2310 03/22/14 0430 03/22/14 1020  TROPONINI 0.05* 0.04* 0.03   BNP: Invalid input(s): POCBNP D-Dimer: No results for input(s): DDIMER in the  last 72 hours. Hemoglobin A1C:  Recent Labs  03/22/14 0430  HGBA1C 8.7*   Fasting Lipid Panel:  Recent Labs  03/22/14 0430  CHOL 97  HDL 21*  LDLCALC 64  TRIG 60  CHOLHDL 4.6   Thyroid Function Tests:  Recent Labs  03/21/14 2310  TSH 1.315   Anemia Panel: No results for input(s): VITAMINB12, FOLATE, FERRITIN, TIBC, IRON, RETICCTPCT in the last 72 hours.  RADIOLOGY: Dg Chest 2 View  03/21/2014   CLINICAL DATA:  Shortness of breath 1 week. Given medication 3 days ago for fluid retention and has had a dry cough since that time with hemoptysis today.  EXAM: CHEST  2 VIEW  COMPARISON:  None.  FINDINGS: Lungs are hypoinflated with blunting of the left costophrenic angle and minimal blunting of the right costophrenic angle compatible with small effusions left greater than right. Minimal prominence of the perihilar markings as cannot exclude a mild degree of vascular congestion. Cardiomediastinal silhouette is within normal. There is minimal spondylosis of the thoracic spine.  IMPRESSION: Hypoinflation with findings suggesting small bilateral pleural  effusions left greater than right. Possible mild vascular congestion.   Electronically Signed   By: Marin Olp M.D.   On: 03/21/2014 18:57   Ct Angio Chest Pe W/cm &/or Wo Cm  03/21/2014   CLINICAL DATA:  Short of breath for extend days. Abdominal distention. Ankle swelling.  EXAM: CT ANGIOGRAPHY CHEST  CT ABDOMEN AND PELVIS WITH CONTRAST  TECHNIQUE: Multidetector CT imaging of the chest was performed using the standard protocol during bolus administration of intravenous contrast. Multiplanar CT image reconstructions and MIPs were obtained to evaluate the vascular anatomy. Multidetector CT imaging of the abdomen and pelvis was performed using the standard protocol during bolus administration of intravenous contrast.  CONTRAST:  159mL OMNIPAQUE IOHEXOL 350 MG/ML SOLN  COMPARISON:  None.  FINDINGS: CTA CHEST FINDINGS  There is a filling defect within the right lower lobe pulmonary artery consistent acute pulmonary embolism. This defect is in a proximal segmental branch (image 171, series 403). No additional filling defects within pulmonary arteries are identified. There is motion degradation the lung bases. The right ventricle to left ventricle ratio is within normal limits. No pericardial fluid. No acute findings aorta great vessels.  There are bilateral moderate to large pleural effusions. There is bibasilar fine airspace disease with some nodularity. There is some motion degradation at the lung bases.  No axillary or supraclavicular lymphadenopathy. Mild mediastinal lymphadenopathy. Several borderline enlarged prevascular nodes.  CT ABDOMEN and PELVIS FINDINGS  Hepatobiliary: No focal hepatic lesion.  Normal gallbladder.  Pancreas: The pancreas is normal.  Spleen: Normal spleen.  Adrenals/Urinary Tract: Adrenal glands, kidneys, adrenal glands and kidneys are normal. There is moderate dilatation of the distal left ureter to 18 mm in diameter (image 74). No obstructing lesion is identified. There is distension  extends up to the vesicoureteral junction.  Stomach/Bowel: The stomach, small bowel, appendix, and cecum normal. The colon and rectosigmoid colon are normal.  Vascular/Lymphatic: Abdominal aorta is normal caliber. There is no retroperitoneal or periportal lymphadenopathy. No pelvic lymphadenopathy.  Reproductive:Prostate is normal.  No pelvic lymphadenopathy.  Other: Small amount free fluid surrounds the liver and spleen. Small amount free fluid the pelvis.  Musculoskeletal: No aggressive osseous lesion.  Review of the MIP images confirms the above findings.  IMPRESSION: Chest Impression:  1. Acute pulmonary embolism in the right lower lobe pulmonary artery. 2. No evidence of right ventricular strain. 3. Moderate bilateral pleural effusions. 4.  There is bibasilar airspace disease with some consolidation in the left lower lobe. Findings suggest pulmonary edema but cannot exclude left lower lobe pneumonia.  Abdomen / Pelvis Impression:  1. Small amount free fluid in the abdomen likely relates to bilateral pleural effusions. 2. Dilatation of the distal left ureter without obstructing lesion identified. Cannot exclude an occult neoplasm. Recommend nonemergent urology consultation. Critical Value/emergent results were called by telephone at the time of interpretation on 03/21/2014 at 8:52 pm to Dr. Abigail Butts , who verbally acknowledged these results.   Electronically Signed   By: Suzy Bouchard M.D.   On: 03/21/2014 20:54   Ct Abdomen Pelvis W Contrast  03/21/2014   CLINICAL DATA:  Short of breath for extend days. Abdominal distention. Ankle swelling.  EXAM: CT ANGIOGRAPHY CHEST  CT ABDOMEN AND PELVIS WITH CONTRAST  TECHNIQUE: Multidetector CT imaging of the chest was performed using the standard protocol during bolus administration of intravenous contrast. Multiplanar CT image reconstructions and MIPs were obtained to evaluate the vascular anatomy. Multidetector CT imaging of the abdomen and pelvis was  performed using the standard protocol during bolus administration of intravenous contrast.  CONTRAST:  177mL OMNIPAQUE IOHEXOL 350 MG/ML SOLN  COMPARISON:  None.  FINDINGS: CTA CHEST FINDINGS  There is a filling defect within the right lower lobe pulmonary artery consistent acute pulmonary embolism. This defect is in a proximal segmental branch (image 171, series 403). No additional filling defects within pulmonary arteries are identified. There is motion degradation the lung bases. The right ventricle to left ventricle ratio is within normal limits. No pericardial fluid. No acute findings aorta great vessels.  There are bilateral moderate to large pleural effusions. There is bibasilar fine airspace disease with some nodularity. There is some motion degradation at the lung bases.  No axillary or supraclavicular lymphadenopathy. Mild mediastinal lymphadenopathy. Several borderline enlarged prevascular nodes.  CT ABDOMEN and PELVIS FINDINGS  Hepatobiliary: No focal hepatic lesion.  Normal gallbladder.  Pancreas: The pancreas is normal.  Spleen: Normal spleen.  Adrenals/Urinary Tract: Adrenal glands, kidneys, adrenal glands and kidneys are normal. There is moderate dilatation of the distal left ureter to 18 mm in diameter (image 74). No obstructing lesion is identified. There is distension extends up to the vesicoureteral junction.  Stomach/Bowel: The stomach, small bowel, appendix, and cecum normal. The colon and rectosigmoid colon are normal.  Vascular/Lymphatic: Abdominal aorta is normal caliber. There is no retroperitoneal or periportal lymphadenopathy. No pelvic lymphadenopathy.  Reproductive:Prostate is normal.  No pelvic lymphadenopathy.  Other: Small amount free fluid surrounds the liver and spleen. Small amount free fluid the pelvis.  Musculoskeletal: No aggressive osseous lesion.  Review of the MIP images confirms the above findings.  IMPRESSION: Chest Impression:  1. Acute pulmonary embolism in the right  lower lobe pulmonary artery. 2. No evidence of right ventricular strain. 3. Moderate bilateral pleural effusions. 4. There is bibasilar airspace disease with some consolidation in the left lower lobe. Findings suggest pulmonary edema but cannot exclude left lower lobe pneumonia.  Abdomen / Pelvis Impression:  1. Small amount free fluid in the abdomen likely relates to bilateral pleural effusions. 2. Dilatation of the distal left ureter without obstructing lesion identified. Cannot exclude an occult neoplasm. Recommend nonemergent urology consultation. Critical Value/emergent results were called by telephone at the time of interpretation on 03/21/2014 at 8:52 pm to Dr. Abigail Butts , who verbally acknowledged these results.   Electronically Signed   By: Suzy Bouchard M.D.   On: 03/21/2014 20:54  PHYSICAL EXAM General: NAD Neck: JVP 10-12 cm, no thyromegaly or thyroid nodule.  Lungs: Decreased breath sounds at bases bilaterally.  CV: Nondisplaced PMI.  Heart regular S1/S2, no S3/S4, no murmur.  No peripheral edema.  No carotid bruit.  Normal pedal pulses.  Abdomen: Soft, nontender, no hepatosplenomegaly, no distention.  Neurologic: Alert and oriented x 3.  Psych: Normal affect. Extremities: No clubbing or cyanosis.   TELEMETRY: Reviewed telemetry pt in [facility]  ASSESSMENT AND PLAN: 48 yo with history of heavy ETOH use and smoking presented with dyspnea, found to have PE.  Echo showed EF 20-25% with mild RV dilation/moderate RV dysfunction.  1. PE: No definite trigger.  He has had no recent surgery, injury, or long car/plane ride.  Father had spontaneous DVT.  Lower extremity ultrasound did not show DVT.  - Continue apixaban as above. Would plan long-term apixaban with cardiomyopathy and no known trigger for PE.   - Hypercoagulable workup ongoing.  Lupus anticoagulant negative.  2. Ureteral dilation: Will need urology followup.  3. Acute systolic CHF: EF 17-91%, mild RV dilation with  moderate dysfunction.  Etiology uncertain.  Heavy drinker so ETOH may contribute.  However, mother and others in her family have had CHF. Brother has a nonischemic cardiomyopathy.  Suspect familial cardiomyopathy.  He is still volume overloaded on exam with orthopnea and dyspnea.  Suspect PE may contribute to RV findings.  - Will give Lasix 80 mg IV bid for now.   - Continue Coreg, lisinopril, and digoxin.  Will add spironolactone 12.5 daily.  Not much room for med titration with BP.  - Strict I/Os, daily weights.   [name] 03/24/2014 4:09 PM

## 2014-03-24 NOTE — Progress Notes (Signed)
Primary Cardiologist: New (Dr. Lovena Le)  Patient Profile: 48 y/o male admitted 03/21/14 for dyspnea. Found to have a RLL PE as well as new onset left ventricular dysfunction with EF of 20-25%.   Subjective: Still a bit dyspneic with exertion. No dyspnea at rest. Denies CP. Also denies any abnormal bleeding.     Objective: Vital signs in last 24 hours: Temp:  [97.5 F (36.4 C)-98.1 F (36.7 C)] 97.5 F (36.4 C) (12/27 2057) Pulse Rate:  [100-121] 106 (12/28 0600) Resp:  [18-22] 18 (12/28 0532) BP: (101-119)/(71-95) 101/71 mmHg (12/28 0532) SpO2:  [96 %-98 %] 97 % (12/28 0532) Last BM Date: 03/23/14  Intake/Output from previous day: 12/27 0701 - 12/28 0700 In: 1268.5 [P.O.:1160; I.V.:108.5] Out: 1475 [Urine:1475] Intake/Output this shift:    Medications Current Facility-Administered Medications  Medication Dose Route Frequency Provider Last Rate Last Dose  . apixaban (ELIQUIS) tablet 10 mg  10 mg Oral BID Adora Fridge, RPH   10 mg at 03/24/14 4315   Followed by  . [START ON 03/30/2014] apixaban (ELIQUIS) tablet 5 mg  5 mg Oral BID Ruta Hinds Nicolsen, RPH      . carvedilol (COREG) tablet 6.25 mg  6.25 mg Oral BID WC Verlee Monte, MD   6.25 mg at 03/24/14 0827  . fluticasone (FLONASE) 50 MCG/ACT nasal spray 2 spray  2 spray Each Nare Daily Verlee Monte, MD   2 spray at 03/24/14 0947  . folic acid (FOLVITE) tablet 1 mg  1 mg Oral Daily Ivor Costa, MD   1 mg at 03/24/14 0933  . furosemide (LASIX) injection 40 mg  40 mg Intravenous Daily Verlee Monte, MD   40 mg at 03/24/14 0948  . HYDROcodone-acetaminophen (NORCO/VICODIN) 5-325 MG per tablet 1-2 tablet  1-2 tablet Oral Q4H PRN Ivor Costa, MD      . insulin aspart (novoLOG) injection 0-9 Units  0-9 Units Subcutaneous TID WC Ivor Costa, MD   1 Units at 03/24/14 [phone]  . lisinopril (PRINIVIL,ZESTRIL) tablet 2.5 mg  2.5 mg Oral Daily Verlee Monte, MD   2.5 mg at 03/24/14 0933  . LORazepam (ATIVAN) injection 0-4 mg  0-4 mg Intravenous  Q12H Ivor Costa, MD      . LORazepam (ATIVAN) tablet 1 mg  1 mg Oral Q6H PRN Ivor Costa, MD   1 mg at 03/23/14 2302   Or  . LORazepam (ATIVAN) injection 1 mg  1 mg Intravenous Q6H PRN Ivor Costa, MD      . multivitamin with minerals tablet 1 tablet  1 tablet Oral Daily Ivor Costa, MD   1 tablet at 03/24/14 [phone]  . nicotine (NICODERM CQ - dosed in mg/24 hours) patch 14 mg  14 mg Transdermal Daily Ivor Costa, MD   14 mg at 03/21/14 2300  . pantoprazole (PROTONIX) EC tablet 40 mg  40 mg Oral Q1200 Ivor Costa, MD   40 mg at 03/23/14 1259  . sodium chloride 0.9 % injection 3 mL  3 mL Intravenous Q12H Ivor Costa, MD   3 mL at 03/24/14 0948  . thiamine (VITAMIN B-1) tablet 100 mg  100 mg Oral Daily Ivor Costa, MD   100 mg at 03/24/14 9509   Or  . thiamine (B-1) injection 100 mg  100 mg Intravenous Daily Ivor Costa, MD   100 mg at 03/23/14 1045    PE: General appearance: alert, cooperative and no distress Neck: no carotid bruit and no JVD Lungs: clear to auscultation bilaterally Heart: regular rate and  rhythm, S1, S2 normal, no murmur, click, rub or gallop Extremities: no LEE Pulses: 2+ and symmetric Skin: warm and dry Neurologic: Grossly normal  Lab Results:   Recent Labs  03/22/14 0430 03/23/14 0320 03/24/14 0742  WBC 10.3 8.6 8.6  HGB 14.1 14.0 14.4  HCT 41.7 42.4 43.5  PLT 163 176 194   BMET  Recent Labs  03/22/14 0430 03/23/14 0320 03/24/14 0742  NA 135 135 135  K 4.0 3.6 4.2  CL 103 102 100  CO2 23 26 25   GLUCOSE 167* 190* 146*  BUN 8 8 17   CREATININE 1.02 1.01 1.18  CALCIUM 8.8 8.6 8.7   PT/INR  Recent Labs  03/21/14 2310  LABPROT 17.7*  INR 1.44   Cholesterol  Recent Labs  03/22/14 0430  CHOL 97   Cardiac Enzymes Invalid input(s): TROPONIN,  CKMB  Studies/Results:  CT of Chest: 03/21/14 IMPRESSION: Chest Impression:  1. Acute pulmonary embolism in the right lower lobe pulmonary artery. 2. No evidence of right ventricular strain. 3. Moderate  bilateral pleural effusions. 4. There is bibasilar airspace disease with some consolidation in the left lower lobe. Findings suggest pulmonary edema but cannot exclude left lower lobe pneumonia.  Abdomen / Pelvis Impression:  1. Small amount free fluid in the abdomen likely relates to bilateral pleural effusions. 2. Dilatation of the distal left ureter without obstructing lesion identified. Cannot exclude an occult neoplasm. Recommend nonemergent urology consultation.   2D echo 03/24/14  Study Conclusions  - Left ventricle: The cavity size was mildly dilated. Wall thickness was normal. Systolic function was severely reduced. The estimated ejection fraction was in the range of 20% to 25%. Diffuse hypokinesis. Doppler parameters are consistent with restrictive physiology, indicative of decreased left ventricular diastolic compliance and/or increased left atrial pressure. - Left atrium: The atrium was severely dilated. - Right ventricle: The cavity size was mildly dilated. Systolic function was moderately reduced. - Right atrium: The atrium was mildly dilated. - Pulmonary arteries: Systolic pressure was moderately increased. PA peak pressure: 44 mm Hg (S). - Pericardium, extracardiac: A trivial pericardial effusion was identified. There was a left pleural effusion.  Impressions:  - Severe global reduction in LV function; restrictive LV filling; severe LAE; mild RAE and RVE with moderately reduced RV function.   Assessment/Plan  Principal Problem:   Pulmonary embolism Active Problems:   Smoking   Alcohol abuse   HTN (hypertension)   Abdominal distension   Acute systolic CHF (congestive heart failure)   Right-sided heart failure   Diabetes mellitus without complication   1. Acute Pulmonary Embolism of the Right Lower Lobe: on Eliquis for anticoagulation. Tolerating well with no signs of bleeding. Hgb stable at 14.4. Will need to continue for at least  6 months. 2D echo shows only moderate RV dysfunction.   2. Systolic Dysfunction/Cardiomyopathy: EF 20-25%. Continue ACE and BB. Renal function and BP have remained stable. Volume status remains stable. Can add low dose diuretic to be used PRN. Will need education on daily weights and low sodium diet. Will need OP NST to assess for ischemia, to help determine nonischemic/ ischemic cardiomyopathy.   3. DM: New diagnosis: Hgb A1c is 8.7. TRH managing. Goal Hgb A1C <7.0. Recommend close OP f/u w/ PCP.  4. Tobacco use: patient reports quitting 3 weeks ago. Patient encouraged to continue to refrain from use.     LOS: 3 days    Brittainy M. Ladoris Gene 03/24/2014 9:53 AM  Personally seen and examined. Agree with above.  After treatment of PE for 6 months, if EF remains low, proceed with cardiac cath (?CAD). Will need ECHO in 3 months.   Troponin minimal elevation (0.06) with PE. Demand.   Continue with current dose of coreg and low dose lisinopril and uptitrate as outpt.   Lasix 20mg  PO QD and additional PRN, daily weights at home.   Brother has cardiomyopathy and sees Dr. Haroldine Laws.   Will have him follow up with me/ APP in flex clinic in 7 days.   OK with DC from cardiology perspective.   Candee Furbish, MD

## 2014-03-25 DIAGNOSIS — J90 Pleural effusion, not elsewhere classified: Secondary | ICD-10-CM | POA: Insufficient documentation

## 2014-03-25 LAB — BASIC METABOLIC PANEL
ANION GAP: 13 (ref 5–15)
BUN: 21 mg/dL (ref 6–23)
CO2: 25 mmol/L (ref 19–32)
CREATININE: 1.29 mg/dL (ref 0.50–1.35)
Calcium: 8.9 mg/dL (ref 8.4–10.5)
Chloride: 95 mEq/L — ABNORMAL LOW (ref 96–112)
GFR calc non Af Amer: 63 mL/min — ABNORMAL LOW (ref >90)
GFR, EST AFRICAN AMERICAN: 73 mL/min — AB (ref >90)
Glucose, Bld: 130 mg/dL — ABNORMAL HIGH (ref 70–99)
Potassium: 4.6 mmol/L (ref 3.5–5.1)
Sodium: 133 mmol/L — ABNORMAL LOW (ref 135–145)

## 2014-03-25 LAB — PROTEIN S, TOTAL: Protein S Ag, Total: 84 % (ref 60–150)

## 2014-03-25 LAB — PROTEIN C, TOTAL: PROTEIN C, TOTAL: 51 % — AB (ref 72–160)

## 2014-03-25 LAB — GLUCOSE, CAPILLARY
Glucose-Capillary: 143 mg/dL — ABNORMAL HIGH (ref 70–99)
Glucose-Capillary: 204 mg/dL — ABNORMAL HIGH (ref 70–99)

## 2014-03-25 MED ORDER — DIGOXIN 125 MCG PO TABS: 0.1250 mg | ORAL_TABLET | Freq: Every day | ORAL | Status: DC

## 2014-03-25 MED ORDER — SPIRONOLACTONE 12.5 MG HALF TABLET: 12.5000 mg | ORAL_TABLET | Freq: Every day | ORAL | Status: DC

## 2014-03-25 MED ORDER — CARVEDILOL 3.125 MG PO TABS: 3.1250 mg | ORAL_TABLET | Freq: Two times a day (BID) | ORAL | Status: DC

## 2014-03-25 MED ORDER — METFORMIN HCL 1000 MG PO TABS: 1000.0000 mg | ORAL_TABLET | Freq: Two times a day (BID) | ORAL | Status: DC

## 2014-03-25 MED ORDER — FUROSEMIDE 40 MG PO TABS: 40.0000 mg | ORAL_TABLET | Freq: Every day | ORAL | Status: DC

## 2014-03-25 MED ORDER — APIXABAN 5 MG PO TABS: 5.0000 mg | ORAL_TABLET | Freq: Two times a day (BID) | ORAL | Status: DC

## 2014-03-25 NOTE — Discharge Summary (Signed)
Physician Discharge Summary  LEARY MCNULTY IRC:[id number] DOB: 10/29/1965 DOA: 03/21/2014  PCP: No primary care provider on file.  Admit date: 03/21/2014 Discharge date: 03/25/2014  Time spent: 40 minutes  Recommendations for Outpatient Follow-up:  1. Follow-up with Dr. Marlou Porch in 1 week.  Discharge Diagnoses:  Principal Problem:   Pulmonary embolism Active Problems:   Smoking   Alcohol abuse   HTN (hypertension)   Abdominal distension   Acute systolic CHF (congestive heart failure)   Right-sided heart failure   Diabetes mellitus without complication   Discharge Condition: Stable  Diet recommendation: Heart healthy  Filed Weights   03/23/14 0400 03/24/14 1310 03/25/14 0610  Weight: 88.5 kg (195 lb 1.7 oz) 90.9 kg (200 lb 6.4 oz) 88.451 kg (195 lb)    History of present illness:  Matthew Green 48 year old Caucasian male with past medical history of smoking, alcohol abuse and hypertension came into the hospital complaining about shortness of breath. Patient also has cough and hemoptysis. Patient described mild orthopnea, and lower extremity edema. Patient CT scan showed PE in the right lower lobe artery, acute CHF with ejection fraction of 20-25%, new onset diabetes his hemoglobin A1c is 8.6.  Hospital Course:    Acute PE Acute pulmonary embolism, likely with pulmonary infarction causing hemoptysis. No evidence of hemodynamic stability, blood pressure stable. Markedly tachycardia, likely secondary to PE and cardiomyopathy. Initially was on heparin drip, discharge on Eliquis.  Acute systolic CHF, biventricular Patient was complaining about lower extremity edema and orthopnea 2-D echo showed acute systolic biventricular CHF, LVEF is 20-25% with diffuse hypokinesis. Patient is started on beta blocker, low-dose ACEI and diuretics. Cardiology consulted, recommended follow-up as outpatient. Echo in 3 months, cardiac catheterization if EF continues to be in the low side.   Please note that his younger brother has dilated cardiomyopathy as well. Cardiology recommended discharge medications as below. High risk for ventricular arrhythmias/sudden cardiac death, on beta blockers, follow with cardiology.  Diabetes mellitus type 2, new onset Presented with hyperglycemia and hemoglobin A1c of 8.6. Placed on SSI and carbohydrate modified diet while is in the hospital. Discharge on 1000 mg of metformin twice a day  Dilatation of the distal left ureter Per radiology report this needs nonemergent urology evaluation. Needs referral to urology as outpatient. Discussed with the patient.  Polysubstance abuse Patient used to smoke and drink, reported he quit about 2 weeks ago. Counseled extensively.  Procedures:  None  Consultations:  Cardiology  Discharge Exam: Filed Vitals:   03/25/14 0938  BP: 105/74  Pulse: 108  Temp:   Resp:    General: Alert and awake, oriented x3, not in any acute distress. HEENT: anicteric sclera, pupils reactive to light and accommodation, EOMI CVS: S1-S2 clear, no murmur rubs or gallops Chest: clear to auscultation bilaterally, no wheezing, rales or rhonchi Abdomen: soft nontender, nondistended, normal bowel sounds, no organomegaly Extremities: no cyanosis, clubbing or edema noted bilaterally Neuro: Cranial nerves II-XII intact, no focal neurological deficits  Discharge Instructions   Discharge Instructions    Diet - low sodium heart healthy    Complete by:  As directed      Diet Carb Modified    Complete by:  As directed      Increase activity slowly    Complete by:  As directed           Current Discharge Medication List    START taking these medications   Details  apixaban (ELIQUIS) 5 MG TABS tablet Take 1 tablet (5  mg total) by mouth 2 (two) times daily. Qty: 74 tablet, Refills: 0    carvedilol (COREG) 3.125 MG tablet Take 1 tablet (3.125 mg total) by mouth 2 (two) times daily with a meal. Qty: 60 tablet,  Refills: 0    digoxin (LANOXIN) 0.125 MG tablet Take 1 tablet (0.125 mg total) by mouth daily. Qty: 30 tablet, Refills: 0    furosemide (LASIX) 40 MG tablet Take 1 tablet (40 mg total) by mouth daily. Qty: 30 tablet, Refills: 0    lisinopril (PRINIVIL,ZESTRIL) 2.5 MG tablet Take 1 tablet (2.5 mg total) by mouth daily. Qty: 30 tablet, Refills: 0    metFORMIN (GLUCOPHAGE) 1000 MG tablet Take 1 tablet (1,000 mg total) by mouth 2 (two) times daily with a meal. Qty: 60 tablet, Refills: 0    spironolactone (ALDACTONE) 12.5 mg TABS tablet Take 0.5 tablets (12.5 mg total) by mouth daily. Qty: 30 tablet, Refills: 0      STOP taking these medications     lisinopril-hydrochlorothiazide (PRINZIDE,ZESTORETIC) 10-12.5 MG per tablet        No Known Allergies Follow-up Information    Follow up with [name], MD On 03/27/2014.   Specialty:  Cardiology   Why:  at 0920 am in the [place] [facility]--gate code [id number]-please bring all medications   Contact information:   [address] [place] [address] [phone]        The results of significant diagnostics from this hospitalization (including imaging, microbiology, ancillary and laboratory) are listed below for reference.    Significant Diagnostic Studies: Dg Chest 2 View  03/21/2014   CLINICAL DATA:  Shortness of breath 1 week. Given medication 3 days ago for fluid retention and has had a dry cough since that time with hemoptysis today.  EXAM: CHEST  2 VIEW  COMPARISON:  None.  FINDINGS: Lungs are hypoinflated with blunting of the left costophrenic angle and minimal blunting of the right costophrenic angle compatible with small effusions left greater than right. Minimal prominence of the perihilar markings as cannot exclude a mild degree of vascular congestion. Cardiomediastinal silhouette is within normal. There is minimal spondylosis of the thoracic spine.  IMPRESSION: Hypoinflation with findings  suggesting small bilateral pleural effusions left greater than right. Possible mild vascular congestion.   Electronically Signed   By: Marin Olp M.D.   On: 03/21/2014 18:57   Ct Angio Chest Pe W/cm &/or Wo Cm  03/21/2014   CLINICAL DATA:  Short of breath for extend days. Abdominal distention. Ankle swelling.  EXAM: CT ANGIOGRAPHY CHEST  CT ABDOMEN AND PELVIS WITH CONTRAST  TECHNIQUE: Multidetector CT imaging of the chest was performed using the standard protocol during bolus administration of intravenous contrast. Multiplanar CT image reconstructions and MIPs were obtained to evaluate the vascular anatomy. Multidetector CT imaging of the abdomen and pelvis was performed using the standard protocol during bolus administration of intravenous contrast.  CONTRAST:  180mL OMNIPAQUE IOHEXOL 350 MG/ML SOLN  COMPARISON:  None.  FINDINGS: CTA CHEST FINDINGS  There is a filling defect within the right lower lobe pulmonary artery consistent acute pulmonary embolism. This defect is in a proximal segmental branch (image 171, series 403). No additional filling defects within pulmonary arteries are identified. There is motion degradation the lung bases. The right ventricle to left ventricle ratio is within normal limits. No pericardial fluid. No acute findings aorta great vessels.  There are bilateral moderate to large pleural effusions. There is bibasilar fine airspace disease with  some nodularity. There is some motion degradation at the lung bases.  No axillary or supraclavicular lymphadenopathy. Mild mediastinal lymphadenopathy. Several borderline enlarged prevascular nodes.  CT ABDOMEN and PELVIS FINDINGS  Hepatobiliary: No focal hepatic lesion.  Normal gallbladder.  Pancreas: The pancreas is normal.  Spleen: Normal spleen.  Adrenals/Urinary Tract: Adrenal glands, kidneys, adrenal glands and kidneys are normal. There is moderate dilatation of the distal left ureter to 18 mm in diameter (image 74). No obstructing lesion  is identified. There is distension extends up to the vesicoureteral junction.  Stomach/Bowel: The stomach, small bowel, appendix, and cecum normal. The colon and rectosigmoid colon are normal.  Vascular/Lymphatic: Abdominal aorta is normal caliber. There is no retroperitoneal or periportal lymphadenopathy. No pelvic lymphadenopathy.  Reproductive:Prostate is normal.  No pelvic lymphadenopathy.  Other: Small amount free fluid surrounds the liver and spleen. Small amount free fluid the pelvis.  Musculoskeletal: No aggressive osseous lesion.  Review of the MIP images confirms the above findings.  IMPRESSION: Chest Impression:  1. Acute pulmonary embolism in the right lower lobe pulmonary artery. 2. No evidence of right ventricular strain. 3. Moderate bilateral pleural effusions. 4. There is bibasilar airspace disease with some consolidation in the left lower lobe. Findings suggest pulmonary edema but cannot exclude left lower lobe pneumonia.  Abdomen / Pelvis Impression:  1. Small amount free fluid in the abdomen likely relates to bilateral pleural effusions. 2. Dilatation of the distal left ureter without obstructing lesion identified. Cannot exclude an occult neoplasm. Recommend nonemergent urology consultation. Critical Value/emergent results were called by telephone at the time of interpretation on 03/21/2014 at 8:52 pm to Dr. Abigail Butts , who verbally acknowledged these results.   Electronically Signed   By: Suzy Bouchard M.D.   On: 03/21/2014 20:54   Ct Abdomen Pelvis W Contrast  03/21/2014   CLINICAL DATA:  Short of breath for extend days. Abdominal distention. Ankle swelling.  EXAM: CT ANGIOGRAPHY CHEST  CT ABDOMEN AND PELVIS WITH CONTRAST  TECHNIQUE: Multidetector CT imaging of the chest was performed using the standard protocol during bolus administration of intravenous contrast. Multiplanar CT image reconstructions and MIPs were obtained to evaluate the vascular anatomy. Multidetector CT  imaging of the abdomen and pelvis was performed using the standard protocol during bolus administration of intravenous contrast.  CONTRAST:  141mL OMNIPAQUE IOHEXOL 350 MG/ML SOLN  COMPARISON:  None.  FINDINGS: CTA CHEST FINDINGS  There is a filling defect within the right lower lobe pulmonary artery consistent acute pulmonary embolism. This defect is in a proximal segmental branch (image 171, series 403). No additional filling defects within pulmonary arteries are identified. There is motion degradation the lung bases. The right ventricle to left ventricle ratio is within normal limits. No pericardial fluid. No acute findings aorta great vessels.  There are bilateral moderate to large pleural effusions. There is bibasilar fine airspace disease with some nodularity. There is some motion degradation at the lung bases.  No axillary or supraclavicular lymphadenopathy. Mild mediastinal lymphadenopathy. Several borderline enlarged prevascular nodes.  CT ABDOMEN and PELVIS FINDINGS  Hepatobiliary: No focal hepatic lesion.  Normal gallbladder.  Pancreas: The pancreas is normal.  Spleen: Normal spleen.  Adrenals/Urinary Tract: Adrenal glands, kidneys, adrenal glands and kidneys are normal. There is moderate dilatation of the distal left ureter to 18 mm in diameter (image 74). No obstructing lesion is identified. There is distension extends up to the vesicoureteral junction.  Stomach/Bowel: The stomach, small bowel, appendix, and cecum normal. The colon and rectosigmoid colon  are normal.  Vascular/Lymphatic: Abdominal aorta is normal caliber. There is no retroperitoneal or periportal lymphadenopathy. No pelvic lymphadenopathy.  Reproductive:Prostate is normal.  No pelvic lymphadenopathy.  Other: Small amount free fluid surrounds the liver and spleen. Small amount free fluid the pelvis.  Musculoskeletal: No aggressive osseous lesion.  Review of the MIP images confirms the above findings.  IMPRESSION: Chest Impression:  1.  Acute pulmonary embolism in the right lower lobe pulmonary artery. 2. No evidence of right ventricular strain. 3. Moderate bilateral pleural effusions. 4. There is bibasilar airspace disease with some consolidation in the left lower lobe. Findings suggest pulmonary edema but cannot exclude left lower lobe pneumonia.  Abdomen / Pelvis Impression:  1. Small amount free fluid in the abdomen likely relates to bilateral pleural effusions. 2. Dilatation of the distal left ureter without obstructing lesion identified. Cannot exclude an occult neoplasm. Recommend nonemergent urology consultation. Critical Value/emergent results were called by telephone at the time of interpretation on 03/21/2014 at 8:52 pm to Dr. Abigail Butts , who verbally acknowledged these results.   Electronically Signed   By: Suzy Bouchard M.D.   On: 03/21/2014 20:54    Microbiology: Recent Results (from the past 240 hour(s))  MRSA PCR Screening     Status: None   Collection Time: 03/21/14 10:33 PM  Result Value Ref Range Status   MRSA by PCR NEGATIVE NEGATIVE Final    Comment:        The GeneXpert MRSA Assay (FDA approved for NASAL specimens only), is one component of a comprehensive MRSA colonization surveillance program. It is not intended to diagnose MRSA infection nor to guide or monitor treatment for MRSA infections.      Labs: Basic Metabolic Panel:  Recent Labs Lab 03/21/14 1745 03/22/14 0430 03/23/14 0320 03/24/14 0742 03/25/14 0411  NA 131* 135 135 135 133*  K 3.8 4.0 3.6 4.2 4.6  CL 95* 103 102 100 95*  CO2 25 23 26 25 25   GLUCOSE 238* 167* 190* 146* 130*  BUN 9 8 8 17 21   CREATININE 1.14 1.02 1.01 1.18 1.29  CALCIUM 9.8 8.8 8.6 8.7 8.9  MG  --   --   --  2.1  --    Liver Function Tests:  Recent Labs Lab 03/21/14 1745 03/22/14 0430  AST 34 20  ALT 38 31  ALKPHOS 104 90  BILITOT 1.5* 1.8*  PROT 6.9 5.9*  ALBUMIN 3.6 3.0*   No results for input(s): LIPASE, AMYLASE in the last 168  hours. No results for input(s): AMMONIA in the last 168 hours. CBC:  Recent Labs Lab 03/21/14 1745 03/22/14 0430 03/23/14 0320 03/24/14 0742  WBC 8.9 10.3 8.6 8.6  HGB 15.9 14.1 14.0 14.4  HCT 46.5 41.7 42.4 43.5  MCV 93.6 93.9 94.6 96.2  PLT 178 163 176 194   Cardiac Enzymes:  Recent Labs Lab 03/21/14 2310 03/22/14 0430 03/22/14 1020  TROPONINI 0.05* 0.04* 0.03   BNP: BNP (last 3 results) No results for input(s): PROBNP in the last 8760 hours. CBG:  Recent Labs Lab 03/24/14 0750 03/24/14 1144 03/24/14 1656 03/24/14 2103 03/25/14 0816  GLUCAP 148* 250* 153* 166* 143*       Signed:  Clair Alfieri A  [facility] 03/25/2014, 11:39 AM

## 2014-03-25 NOTE — Progress Notes (Signed)
Patient ID: Matthew Green, male   DOB: Jun 27, 1965, 48 y.o.   MRN: [id number]   Admitted with dyspnea that was found to be from PE and new onset systolic heart failure EF 20-25%. He is being anticoagulated with eliquis.   Yesterday he was diuresed with a total of 120 mg IV lasix.  Also had orthopnea and dyspnea at rest. This has resolved today. Weight down 5 pounds over night.    Much improved. Denies SOB/orthopnea. Able to sleep.     Scheduled Meds: . apixaban  10 mg Oral BID   Followed by  . [START ON 03/30/2014] apixaban  5 mg Oral BID  . carvedilol  3.125 mg Oral BID WC  . digoxin  0.125 mg Oral Daily  . fluticasone  2 spray Each Nare Daily  . folic acid  1 mg Oral Daily  . furosemide  80 mg Intravenous BID  . insulin aspart  0-9 Units Subcutaneous TID WC  . lisinopril  2.5 mg Oral Daily  . multivitamin with minerals  1 tablet Oral Daily  . nicotine  14 mg Transdermal Daily  . pantoprazole  40 mg Oral Q1200  . sodium chloride  3 mL Intravenous Q12H  . spironolactone  12.5 mg Oral Daily  . thiamine  100 mg Oral Daily   Or  . thiamine  100 mg Intravenous Daily   Continuous Infusions:  PRN Meds:.HYDROcodone-acetaminophen, zolpidem   Filed Vitals:   03/24/14 2100 03/24/14 2200 03/25/14 0028 03/25/14 0610  BP: 79/61 106/80 92/59 103/73  Pulse: 101  104 100  Temp: 97.7 F (36.5 C)  98 F (36.7 C) 97.6 F (36.4 C)  TempSrc: Oral  Oral Oral  Resp:    16  Height:      Weight:    195 lb (88.451 kg)  SpO2: 98%  96% 94%    Intake/Output Summary (Last 24 hours) at 03/25/14 0854 Last data filed at 03/25/14 0233  Gross per 24 hour  Intake      0 ml  Output   1700 ml  Net  -1700 ml    LABS: Basic Metabolic Panel:  Recent Labs  03/24/14 0742 03/25/14 0411  NA 135 133*  K 4.2 4.6  CL 100 95*  CO2 25 25  GLUCOSE 146* 130*  BUN 17 21  CREATININE 1.18 1.29  CALCIUM 8.7 8.9  MG 2.1  --    Liver Function Tests: No results for input(s): AST, ALT, ALKPHOS, BILITOT,  PROT, ALBUMIN in the last 72 hours. No results for input(s): LIPASE, AMYLASE in the last 72 hours. CBC:  Recent Labs  03/23/14 0320 03/24/14 0742  WBC 8.6 8.6  HGB 14.0 14.4  HCT 42.4 43.5  MCV 94.6 96.2  PLT 176 194   Cardiac Enzymes:  Recent Labs  03/22/14 1020  TROPONINI 0.03   BNP: Invalid input(s): POCBNP D-Dimer: No results for input(s): DDIMER in the last 72 hours. Hemoglobin A1C: No results for input(s): HGBA1C in the last 72 hours. Fasting Lipid Panel: No results for input(s): CHOL, HDL, LDLCALC, TRIG, CHOLHDL, LDLDIRECT in the last 72 hours. Thyroid Function Tests: No results for input(s): TSH, T4TOTAL, T3FREE, THYROIDAB in the last 72 hours.  Invalid input(s): FREET3 Anemia Panel: No results for input(s): VITAMINB12, FOLATE, FERRITIN, TIBC, IRON, RETICCTPCT in the last 72 hours.  RADIOLOGY: Dg Chest 2 View  03/21/2014   CLINICAL DATA:  Shortness of breath 1 week. Given medication 3 days ago for fluid retention and has had a  dry cough since that time with hemoptysis today.  EXAM: CHEST  2 VIEW  COMPARISON:  None.  FINDINGS: Lungs are hypoinflated with blunting of the left costophrenic angle and minimal blunting of the right costophrenic angle compatible with small effusions left greater than right. Minimal prominence of the perihilar markings as cannot exclude a mild degree of vascular congestion. Cardiomediastinal silhouette is within normal. There is minimal spondylosis of the thoracic spine.  IMPRESSION: Hypoinflation with findings suggesting small bilateral pleural effusions left greater than right. Possible mild vascular congestion.   Electronically Signed   By: Marin Olp M.D.   On: 03/21/2014 18:57   Ct Angio Chest Pe W/cm &/or Wo Cm  03/21/2014   CLINICAL DATA:  Short of breath for extend days. Abdominal distention. Ankle swelling.  EXAM: CT ANGIOGRAPHY CHEST  CT ABDOMEN AND PELVIS WITH CONTRAST  TECHNIQUE: Multidetector CT imaging of the chest was  performed using the standard protocol during bolus administration of intravenous contrast. Multiplanar CT image reconstructions and MIPs were obtained to evaluate the vascular anatomy. Multidetector CT imaging of the abdomen and pelvis was performed using the standard protocol during bolus administration of intravenous contrast.  CONTRAST:  174mL OMNIPAQUE IOHEXOL 350 MG/ML SOLN  COMPARISON:  None.  FINDINGS: CTA CHEST FINDINGS  There is a filling defect within the right lower lobe pulmonary artery consistent acute pulmonary embolism. This defect is in a proximal segmental branch (image 171, series 403). No additional filling defects within pulmonary arteries are identified. There is motion degradation the lung bases. The right ventricle to left ventricle ratio is within normal limits. No pericardial fluid. No acute findings aorta great vessels.  There are bilateral moderate to large pleural effusions. There is bibasilar fine airspace disease with some nodularity. There is some motion degradation at the lung bases.  No axillary or supraclavicular lymphadenopathy. Mild mediastinal lymphadenopathy. Several borderline enlarged prevascular nodes.  CT ABDOMEN and PELVIS FINDINGS  Hepatobiliary: No focal hepatic lesion.  Normal gallbladder.  Pancreas: The pancreas is normal.  Spleen: Normal spleen.  Adrenals/Urinary Tract: Adrenal glands, kidneys, adrenal glands and kidneys are normal. There is moderate dilatation of the distal left ureter to 18 mm in diameter (image 74). No obstructing lesion is identified. There is distension extends up to the vesicoureteral junction.  Stomach/Bowel: The stomach, small bowel, appendix, and cecum normal. The colon and rectosigmoid colon are normal.  Vascular/Lymphatic: Abdominal aorta is normal caliber. There is no retroperitoneal or periportal lymphadenopathy. No pelvic lymphadenopathy.  Reproductive:Prostate is normal.  No pelvic lymphadenopathy.  Other: Small amount free fluid  surrounds the liver and spleen. Small amount free fluid the pelvis.  Musculoskeletal: No aggressive osseous lesion.  Review of the MIP images confirms the above findings.  IMPRESSION: Chest Impression:  1. Acute pulmonary embolism in the right lower lobe pulmonary artery. 2. No evidence of right ventricular strain. 3. Moderate bilateral pleural effusions. 4. There is bibasilar airspace disease with some consolidation in the left lower lobe. Findings suggest pulmonary edema but cannot exclude left lower lobe pneumonia.  Abdomen / Pelvis Impression:  1. Small amount free fluid in the abdomen likely relates to bilateral pleural effusions. 2. Dilatation of the distal left ureter without obstructing lesion identified. Cannot exclude an occult neoplasm. Recommend nonemergent urology consultation. Critical Value/emergent results were called by telephone at the time of interpretation on 03/21/2014 at 8:52 pm to Dr. Abigail Butts , who verbally acknowledged these results.   Electronically Signed   By: Helane Gunther.D.  On: 03/21/2014 20:54   Ct Abdomen Pelvis W Contrast  03/21/2014   CLINICAL DATA:  Short of breath for extend days. Abdominal distention. Ankle swelling.  EXAM: CT ANGIOGRAPHY CHEST  CT ABDOMEN AND PELVIS WITH CONTRAST  TECHNIQUE: Multidetector CT imaging of the chest was performed using the standard protocol during bolus administration of intravenous contrast. Multiplanar CT image reconstructions and MIPs were obtained to evaluate the vascular anatomy. Multidetector CT imaging of the abdomen and pelvis was performed using the standard protocol during bolus administration of intravenous contrast.  CONTRAST:  147mL OMNIPAQUE IOHEXOL 350 MG/ML SOLN  COMPARISON:  None.  FINDINGS: CTA CHEST FINDINGS  There is a filling defect within the right lower lobe pulmonary artery consistent acute pulmonary embolism. This defect is in a proximal segmental branch (image 171, series 403). No additional filling  defects within pulmonary arteries are identified. There is motion degradation the lung bases. The right ventricle to left ventricle ratio is within normal limits. No pericardial fluid. No acute findings aorta great vessels.  There are bilateral moderate to large pleural effusions. There is bibasilar fine airspace disease with some nodularity. There is some motion degradation at the lung bases.  No axillary or supraclavicular lymphadenopathy. Mild mediastinal lymphadenopathy. Several borderline enlarged prevascular nodes.  CT ABDOMEN and PELVIS FINDINGS  Hepatobiliary: No focal hepatic lesion.  Normal gallbladder.  Pancreas: The pancreas is normal.  Spleen: Normal spleen.  Adrenals/Urinary Tract: Adrenal glands, kidneys, adrenal glands and kidneys are normal. There is moderate dilatation of the distal left ureter to 18 mm in diameter (image 74). No obstructing lesion is identified. There is distension extends up to the vesicoureteral junction.  Stomach/Bowel: The stomach, small bowel, appendix, and cecum normal. The colon and rectosigmoid colon are normal.  Vascular/Lymphatic: Abdominal aorta is normal caliber. There is no retroperitoneal or periportal lymphadenopathy. No pelvic lymphadenopathy.  Reproductive:Prostate is normal.  No pelvic lymphadenopathy.  Other: Small amount free fluid surrounds the liver and spleen. Small amount free fluid the pelvis.  Musculoskeletal: No aggressive osseous lesion.  Review of the MIP images confirms the above findings.  IMPRESSION: Chest Impression:  1. Acute pulmonary embolism in the right lower lobe pulmonary artery. 2. No evidence of right ventricular strain. 3. Moderate bilateral pleural effusions. 4. There is bibasilar airspace disease with some consolidation in the left lower lobe. Findings suggest pulmonary edema but cannot exclude left lower lobe pneumonia.  Abdomen / Pelvis Impression:  1. Small amount free fluid in the abdomen likely relates to bilateral pleural  effusions. 2. Dilatation of the distal left ureter without obstructing lesion identified. Cannot exclude an occult neoplasm. Recommend nonemergent urology consultation. Critical Value/emergent results were called by telephone at the time of interpretation on 03/21/2014 at 8:52 pm to Dr. Abigail Butts , who verbally acknowledged these results.   Electronically Signed   By: Suzy Bouchard M.D.   On: 03/21/2014 20:54    PHYSICAL EXAM General: NAD Neck: JVP 5-6 cm, no thyromegaly or thyroid nodule.  Lungs: Decreased breath sounds at bases bilaterally.  CV: Nondisplaced PMI.  Heart regular S1/S2, no S3/S4, no murmur.  No peripheral edema.  No carotid bruit.  Normal pedal pulses.  Abdomen: Soft, nontender, no hepatosplenomegaly, no distention.  Neurologic: Alert and oriented x 3.  Psych: Normal affect. Extremities: No clubbing or cyanosis.   TELEMETRY: Reviewed telemetry pt in NSR  ASSESSMENT AND PLAN: 48 yo with history of heavy ETOH use and smoking presented with dyspnea, found to have PE.  Echo showed  EF 20-25% with mild RV dilation/moderate RV dysfunction.  1. PE: No definite trigger.  He has had no recent surgery, injury, or long car/plane ride.  Father had spontaneous DVT.  Lower extremity ultrasound did not show DVT.  - Continue apixaban 10 mg twice a day until 03/30/13 then 5 mg twice a day.  Would plan long-term apixaban with cardiomyopathy and no known trigger for PE.   - Hypercoagulable workup ongoing.  Lupus anticoagulant negative.  2. Ureteral dilation: Will need urology followup.  3. Acute systolic CHF: EF 31-51%, mild RV dilation with moderate dysfunction.  Etiology uncertain.  Heavy drinker so ETOH may contribute.  However, mother and others in her family have had CHF. Brother has a nonischemic cardiomyopathy.  Suspect familial cardiomyopathy.   NYHA IIIb. Volume status much improved. Weight down additional 5 pounds. No orthopnea/dyspnea this am.  Stop IV lasix and transition  to lasix 40 mg daily.   Continue Coreg, lisinopril, and digoxin.  Will add spironolactone 12.5 daily.  HF navigator to meet with him this am. Cardiac Rehab to see today.  4. ETOH: Prior to admit drinking 12 beers daily. Stressed the importance of alcohol cessation.  5. Former smoker- quit 3 weeks ago.   Likely home today. Has follow up in HF clinic later this week. Check BMET at that time.   HF Meds-  Lasix 40 mg daily Spironolactone 12.5 mg daily  Carvedilol 3.12 mg twice a day Lisinopril 2.5 mg daily Digoxin 0.125 mg daily  CLEGG,AMY NP-C  03/25/2014 8:54 AM  Patient seen with NP, agree with the above note.  He diuresed well yesterday.  Weight is down a lot today, breathing much better.  JVP still 8-9 cm range.  I think it would be ok to go home today on Lasix 40 mg po daily and the above meds.  He will need to followup with [place] in CHF clinic later this week.  As above, imperative to stop ETOH.   He will need to have cardiac MRI (can do as outpatient).  Eventually, he will need RHC/LHC.  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  Cardiac MRI will show any scar from prior MI.   [name] 03/25/2014 10:24 AM

## 2014-03-25 NOTE — Progress Notes (Signed)
Heart Failure Navigator Consult Note  Presentation: Matthew Green presented with is a 48 y.o. male with past medical history of smoking, alcohol abuse, hypertension, who presents with shortness of breath.  Patient reports that he started having shortness of breath about 10 days ago. Initially he had epigastric discomfort, but no chest pain. He has dry cough, no fever or chills. His shortness of breath has been progressively getting worse. He visited the urgent care on 12/22, where he was diagnosed as hypertension, and discharged on Prinzide. After he started taking Prinzide, his cough has been worsening. He coughed up a little blood in this PM. Patient reports that he noticed his legs are swelling recently. He felt that his vein over his left medial thigh was prominent before he started having SOB.   He has been smoking 0.5 to 1.0 PAD for 12 years and drinks 6 beers very nightly. He quit both smoking and drinking on 03/11/14, he feels fine.   Patient denies fever, chills, fatigue, headaches, chest pain, diarrhea, constipation, dysuria, urgency, frequency, hematuria, skin rashes, joint pain or leg swelling.   Past Medical History  Diagnosis Date  . Hypertension     History   Social History  . Marital Status: Married    Spouse Name: N/A    Number of Children: N/A  . Years of Education: N/A   Social History Main Topics  . Smoking status: Former Research scientist (life sciences)  . Smokeless tobacco: None  . Alcohol Use: Yes  . Drug Use: No  . Sexual Activity: None   Other Topics Concern  . None   Social History Narrative  . None    ECHO:Study Conclusions--03/22/14  - Left ventricle: The cavity size was mildly dilated. Wall thickness was normal. Systolic function was severely reduced. The estimated ejection fraction was in the range of 20% to 25%. Diffuse hypokinesis. Doppler parameters are consistent with restrictive physiology, indicative of decreased left ventricular diastolic  compliance and/or increased left atrial pressure. - Left atrium: The atrium was severely dilated. - Right ventricle: The cavity size was mildly dilated. Systolic function was moderately reduced. - Right atrium: The atrium was mildly dilated. - Pulmonary arteries: Systolic pressure was moderately increased. PA peak pressure: 44 mm Hg (S). - Pericardium, extracardiac: A trivial pericardial effusion was identified. There was a left pleural effusion.  Impressions:  - Severe global reduction in LV function; restrictive LV filling; severe LAE; mild RAE and RVE with moderately reduced RV function.  Transthoracic echocardiography. M-mode, complete 2D, spectral Doppler, and color Doppler. Birthdate: Patient birthdate: 02-Jan-1966. Age: Patient is 48 yr old. Sex: Gender: male. BMI: 30.3 kg/m^2. Blood pressure:   102/81 Patient status: Inpatient. Study date: Study date: 03/22/2014. Study time: 01:09 PM. Location: ICU/CCU  BNP No results found for: PROBNP  Education Assessment and Provision:  Detailed education and instructions provided on heart failure disease management including the following:  Signs and symptoms of Heart Failure When to call the physician Importance of daily weights Low sodium diet Fluid restriction Medication management Anticipated future follow-up appointments  Patient education given on each of the above topics.  Patient acknowledges understanding and acceptance of all instructions.  I spoke at length with Matthew Green regarding his new HF.  We discussed daily weights and their importance.  He does have a scale at home.  We also discussed a low sodium diet and high sodium foods to avoid.  His brother and sister- in- law have already begun to "clean out his kitchen and replace high  sodium foods".  He has a very stressful job by his description and has been very active up until his recent admission to the hospital.  He seems motivated and willing to  make necessary changes to benefit his health.  He will have follow-up in the HF clinic on Thursday at [place].  Education Materials:  "Living Better With Heart Failure" Booklet, Daily Weight Tracker Tool and Heart Failure Educational Video.   High Risk Criteria for Readmission and/or Poor Patient Outcomes: (Recommend Follow-up with Advanced Heart Failure Clinic)--yes    EF <30%- Yes new 20-25%  2 or more admissions in 6 months- no -New HF  Difficult social situation- No  Demonstrates medication noncompliance- No    Barriers of Care:  New diagnosis of HF-- Knowledge and compliance  Discharge Planning:  Plans to discharge to home alone.  He is divorced --however he has family support from siblings and his mother.  He will follow-up in the AHF clinic on Thursday at 0920.

## 2014-03-26 LAB — BETA-2-GLYCOPROTEIN I ABS, IGG/M/A
BETA-2-GLYCOPROTEIN I IGM: 6 M Units (ref <20)
Beta-2 Glyco I IgG: 10 G Units (ref <20)
Beta-2-Glycoprotein I IgA: 10 A Units (ref <20)

## 2014-03-26 LAB — CARDIOLIPIN ANTIBODIES, IGG, IGM, IGA
ANTICARDIOLIPIN IGM: 2 [MPL'U]/mL — AB (ref <11)
Anticardiolipin IgA: 10 APL U/mL — ABNORMAL LOW (ref <22)
Anticardiolipin IgG: 19 GPL U/mL (ref <23)

## 2014-03-27 ENCOUNTER — Encounter (HOSPITAL_COMMUNITY): Payer: Self-pay

## 2014-03-27 ENCOUNTER — Encounter (HOSPITAL_COMMUNITY): Payer: Self-pay | Admitting: Vascular Surgery

## 2014-03-27 ENCOUNTER — Ambulatory Visit (HOSPITAL_COMMUNITY)
Admission: RE | Admit: 2014-03-27 | Discharge: 2014-03-27 | Disposition: A | Discharge status: Home / Self Care | Payer: BC Managed Care – PPO | Source: Ambulatory Visit | Attending: Cardiology | Admitting: Cardiology

## 2014-03-27 VITALS — BP 110/72 | HR 96 | Wt 193.1 lb

## 2014-03-27 DIAGNOSIS — E119 Type 2 diabetes mellitus without complications: Secondary | ICD-10-CM

## 2014-03-27 DIAGNOSIS — I50810 Right heart failure, unspecified: Secondary | ICD-10-CM

## 2014-03-27 DIAGNOSIS — Z720 Tobacco use: Secondary | ICD-10-CM

## 2014-03-27 DIAGNOSIS — Z79899 Other long term (current) drug therapy: Secondary | ICD-10-CM | POA: Diagnosis not present

## 2014-03-27 DIAGNOSIS — F17200 Nicotine dependence, unspecified, uncomplicated: Secondary | ICD-10-CM

## 2014-03-27 DIAGNOSIS — Z87891 Personal history of nicotine dependence: Secondary | ICD-10-CM | POA: Diagnosis not present

## 2014-03-27 DIAGNOSIS — I5022 Chronic systolic (congestive) heart failure: Secondary | ICD-10-CM | POA: Insufficient documentation

## 2014-03-27 DIAGNOSIS — I509 Heart failure, unspecified: Secondary | ICD-10-CM

## 2014-03-27 DIAGNOSIS — I5021 Acute systolic (congestive) heart failure: Secondary | ICD-10-CM

## 2014-03-27 DIAGNOSIS — F1010 Alcohol abuse, uncomplicated: Secondary | ICD-10-CM

## 2014-03-27 DIAGNOSIS — I2699 Other pulmonary embolism without acute cor pulmonale: Secondary | ICD-10-CM | POA: Diagnosis not present

## 2014-03-27 DIAGNOSIS — Z7902 Long term (current) use of antithrombotics/antiplatelets: Secondary | ICD-10-CM | POA: Diagnosis not present

## 2014-03-27 LAB — DIGOXIN LEVEL: Digoxin Level: 0.7 ng/mL — ABNORMAL LOW (ref 0.8–2.0)

## 2014-03-27 MED ORDER — LISINOPRIL 5 MG PO TABS: ORAL_TABLET | ORAL | Status: DC

## 2014-03-27 MED ORDER — CARVEDILOL 6.25 MG PO TABS: 6.2500 mg | ORAL_TABLET | Freq: Two times a day (BID) | ORAL | Status: DC

## 2014-03-27 MED ORDER — CARVEDILOL 6.25 MG PO TABS: 3.1250 mg | ORAL_TABLET | Freq: Two times a day (BID) | ORAL | Status: DC

## 2014-03-27 NOTE — Progress Notes (Signed)
Patient ID: Matthew Green, male   DOB: [birth year], 48 y.o.   MRN: [id number] PCP: Dr. Birdie Riddle  48 yo with history of tobacco use and heavy ETOH use developed exertional dyspnea and orthopnea about 3 weeks prior to recent admission.  No chest pain.  He was admitted and found to have a PE.  Lower extremity US showed no DVT.  He had no known trigger: no surgery or prolonged immobility.  His father had a DVT after a leg injury.  As part of the workup, he had an echo showing EF 20-25% with diffuse hypokinesis.  He was noted to be volume overloaded and was diuresed.  He was started on apixaban.  His brother and multiple family members on his mother's side have a history of cardiomyopathy.  Of note, he has been a heavy drinker, up to 12 beers/day, since his divorce.  He was also a heavy smoker until about a month ago when he quit.    Now that he is home again, he feels much better.  No orthopnea/PND.  Minimal dyspnea now, he can climb steps without shortness of breath.  He has not drunk ETOH or smoked cigarettes since discharge.  No lightheadedness or syncope.   ECG: NSR, poor RWP, left axis deviation  Labs (12/15): K 4.6, creatinine 1.29, HCT 43.5, BNP 987, TnI 0.05, AST/ALT normal, LDL 64, TSH normal, lupus anticoagulant negative, beta-2 glycoprotein negative, anticardiolipin antibody negative.   PMH: 1. PE: Diagnosed by CTA in 12/15. Lower extremity US was negative.  There was no trigger.  Lupus anticoagulant negative, beta-2 glycoprotein negative, anticardiolipin antibody negative.  2. Cardiomyopathy: Echo (12/15) with EF 20-25%, mildly dilated LV with diffuse hypokinesis, RV mildly dilated with moderately decreased systolic function, PA systolic pressure 44 mmHg.  3. Distal ureteral dilation on left.   SH: Lives in [place], works as Teacher, adult education, divorced.  Drank 12 beers/day, has now quit.  Smokes 1/2-1 ppd, has now quit.   FH: Father with DVT after leg injury.  Mother, aunts, and brother with  cardiomyopathy/CHF.   ROS: All systems reviewed and negative except as per HPI.   Current Outpatient Prescriptions  Medication Sig Dispense Refill  . apixaban (ELIQUIS) 5 MG TABS tablet Take 1 tablet (5 mg total) by mouth 2 (two) times daily. 74 tablet 0  . carvedilol (COREG) 6.25 MG tablet Take 1 tablet (6.25 mg total) by mouth 2 (two) times daily with a meal. 180 tablet 3  . digoxin (LANOXIN) 0.125 MG tablet Take 1 tablet (0.125 mg total) by mouth daily. 30 tablet 0  . furosemide (LASIX) 40 MG tablet Take 1 tablet (40 mg total) by mouth daily. 30 tablet 0  . lisinopril (PRINIVIL,ZESTRIL) 5 MG tablet PLEASE TAKE 5 MG ONCE DAILY 30 tablet 3  . metFORMIN (GLUCOPHAGE) 1000 MG tablet Take 1 tablet (1,000 mg total) by mouth 2 (two) times daily with a meal. 60 tablet 0  . spironolactone (ALDACTONE) 12.5 mg TABS tablet Take 0.5 tablets (12.5 mg total) by mouth daily. 30 tablet 0   No current facility-administered medications for this encounter.   BP 110/72 mmHg  Pulse 96  Wt 193 lb 1.9 oz (87.599 kg)  SpO2 97% General: NAD Neck: JVP 8 cm, no thyromegaly or thyroid nodule.  Lungs: Clear to auscultation bilaterally with normal respiratory effort. CV: Nondisplaced PMI.  Heart regular S1/S2, no S3/S4, no murmur.  1+ ankle edema.  No carotid bruit.  Normal pedal pulses.  Abdomen: Soft, nontender, no hepatosplenomegaly, no  distention.  Skin: Intact without lesions or rashes.  Neurologic: Alert and oriented x 3.  Psych: Normal affect. Extremities: No clubbing or cyanosis.  HEENT: Normal.   Assessment/Plan: 1. PE: Patient developed a PE with no obvious trigger. His father had a DVT but it seems to have happened after a leg injury.  Initial hypercoagulable workup negative, awaiting return of factor V Leiden and prothrombin gene mutation test.   - He will continue apixaban 5 mg bid.  Given spontaneous DVT, he likely will need to continue this long-term.  We can likely decrease apixaban to 2.5 mg bid  after 6 months.   2. Chronic systolic CHF: Suspect nonischemic cardiomyopathy.  EF 20-25% on echo.  NYHA class II symptoms currently.  He is mildy volume overloaded on exam but improved compared to during his hospitalization.  Heavy ETOH use may contribute to cardiomyopathy.  However, he does have a strong family history of CMP (brother, mother and members of mother's family).  This may be a familial cardiomyopathy.   - Increase Coreg to 6.25 mg bid - Increase lisinopril to 5 mg daily - Continue digoxin and spironolactone.  - Continue current Lasix dosing.   - Check BMET, BNP, and digoxin level today.  - I will arrange for cardiac MRI to assess for evidence of infiltrative disease or myocarditis.  This could also demonstrate an LGE pattern suggestive of prior MI.  He will eventually need a cardiac cath, but if there is not an LGE pattern on MRI suggestive of MI, I would plan to cath him after 6 months of apixaban for PE.   - Nutrition consult for dietary counseling (low sodium diet).  - We discussed consideration of genetic testing.  This could be helpful for his children and his brother's children.  3. Ureteral dilation: Noted on CT while in hospital.  He will need non-urgent urology evaluation.   [name] 03/27/2014

## 2014-03-27 NOTE — Patient Instructions (Signed)
Please increase Coreg to 6.25mg  to twice daily And increase Lisinopril to 5 mg daily You have been referred to a nutritionist for dietary education regarding Low sodium and Low glycemic diet. You have also been referred to a urologist--they will contact you for an appt. You will also be scheduled for a Cardiac MRI--They will call you to make that appt. Please follow-up with our clinic in 3 weeks.

## 2014-03-30 LAB — PROTHROMBIN GENE MUTATION

## 2014-03-30 LAB — FACTOR 5 LEIDEN

## 2014-04-02 ENCOUNTER — Telehealth (HOSPITAL_COMMUNITY): Payer: Self-pay | Admitting: Surgery

## 2014-04-02 NOTE — Telephone Encounter (Signed)
Received a call from patient to verify if he had been referred to [facility] for education and for a cardiac MRI.  Both orders have been put in and appt for nutrition education has been made (Jan 7th at 10:45am).  He will be notified via phone to make appt for cardiac MRI once prior auth is completed.  He is aware and agreeable.

## 2014-04-02 NOTE — Addendum Note (Signed)
Encounter addended by: Scarlette Calico, RN on: 04/02/2014  2:56 PM<BR>     Documentation filed: Visit Diagnoses, Dx Association, Orders

## 2014-04-03 ENCOUNTER — Encounter: Payer: BLUE CROSS/BLUE SHIELD | Attending: Family Medicine | Admitting: Dietician

## 2014-04-03 ENCOUNTER — Encounter: Payer: Self-pay | Admitting: Dietician

## 2014-04-03 ENCOUNTER — Other Ambulatory Visit (HOSPITAL_COMMUNITY): Payer: Self-pay | Admitting: Cardiology

## 2014-04-03 VITALS — Ht 68.5 in | Wt 177.5 lb

## 2014-04-03 DIAGNOSIS — E119 Type 2 diabetes mellitus without complications: Secondary | ICD-10-CM | POA: Diagnosis not present

## 2014-04-03 DIAGNOSIS — Z713 Dietary counseling and surveillance: Secondary | ICD-10-CM | POA: Insufficient documentation

## 2014-04-03 DIAGNOSIS — I5022 Chronic systolic (congestive) heart failure: Secondary | ICD-10-CM

## 2014-04-03 NOTE — Progress Notes (Signed)
Diabetes Self-Management Education  Visit Type:  Initial  Appt. Start Time: 1045 Appt. End Time: 1215  04/03/2014  Mr. Matthew Green, identified by name and date of birth, is a 49 y.o. male with a diagnosis of Diabetes: Type 2.  Other people present during visit:  Patient   ASSESSMENT  Height 5' 8.5" (1.74 m), weight 177 lb 8 oz (80.513 kg). Body mass index is 26.59 kg/(m^2).  Initial Visit Information:  Are you currently following a meal plan?: Yes What type of meal plan do you follow?: low sodium, increased fruit and veggies, sugar-free products, watching carbs Are you taking your medications as prescribed?: Yes Are you checking your feet?: Yes        Psychosocial:     Patient Belief/Attitude about Diabetes: Afraid Self-management support: Doctor's office, Family, Friends Other persons present: Patient Patient Concerns: Nutrition/Meal planning  Complications:   Last HgB A1C per patient/outside source: 8.7% How often do you check your blood sugar?: 1-2 times/day (fasting blood sugar) Fasting Blood glucose range (mg/dL): 70-129 Have you had a dilated eye exam in the past 12 months?: No (2 years ago) Have you had a dental exam in the past 12 months?: Yes  Diet Intake:  Breakfast: 1 cup oatmeal Or cheerio cereal with fruit and skim milk and water Snack (morning): apple or kind bar Lunch: lean meat ([place] or roast beef low sodium) with swiss cheese and low sodium chips with salsa, diet ginger ale Snack (afternoon): handful walnuts Dinner: salad with greek or rasberry walnut vinagrette OR meatloaf with lean ground meat, low sodium/low sugar ketchup , water or sugar-free snapple tea/lemonade or diet ginger ale Snack (evening): bowl of cheerios or bare naked cereal with fat-free milk Beverage(s): diet ginger ale, 3 bottles water, diet snapple, black coffee  Exercise:  Exercise: Light (walking / raking leaves) Light Exercise amount of time (min / week):  15  Individualized Plan for Diabetes Self-Management Training:   Learning Objective:  Patient will have a greater understanding of diabetes self-management.   Education Topics Reviewed with Patient Today:  Definition of diabetes, type 1 and 2, and the diagnosis of diabetes Role of diet in the treatment of diabetes and the relationship between the three main macronutrients and blood glucose level, Food label reading, portion sizes and measuring food., Carbohydrate counting Role of exercise on diabetes management, blood pressure control and cardiac health.   Identified appropriate SMBG and/or A1C goals.   Relationship between chronic complications and blood glucose control Role of stress on diabetes      PATIENTS GOALS/Plan (Developed by the patient):  Nutrition: Follow meal plan discussed, General guidelines for healthy choices and portions discussed  Plan:   There are no Patient Instructions on file for this visit.  Expected Outcomes:  Demonstrated interest in learning. Expect positive outcomes  Education material provided: Living Well with Diabetes, Meal plan card, My Plate and Snack sheet  If problems or questions, patient to contact team via:  Phone  Future DSME appointment: PRN

## 2014-04-08 ENCOUNTER — Encounter: Payer: Self-pay | Admitting: Cardiology

## 2014-04-09 ENCOUNTER — Telehealth (HOSPITAL_COMMUNITY): Payer: Self-pay | Admitting: Vascular Surgery

## 2014-04-10 ENCOUNTER — Other Ambulatory Visit (HOSPITAL_COMMUNITY): Payer: Self-pay | Admitting: *Deleted

## 2014-04-10 MED ORDER — CARVEDILOL 6.25 MG PO TABS: 6.2500 mg | ORAL_TABLET | Freq: Two times a day (BID) | ORAL | Status: DC

## 2014-04-16 ENCOUNTER — Ambulatory Visit (HOSPITAL_COMMUNITY)
Admission: RE | Admit: 2014-04-16 | Discharge: 2014-04-16 | Disposition: A | Discharge status: Home / Self Care | Payer: BLUE CROSS/BLUE SHIELD | Source: Ambulatory Visit | Attending: Internal Medicine | Admitting: Internal Medicine

## 2014-04-16 VITALS — BP 106/70 | HR 80 | Wt 174.5 lb

## 2014-04-16 DIAGNOSIS — I5022 Chronic systolic (congestive) heart failure: Secondary | ICD-10-CM | POA: Insufficient documentation

## 2014-04-16 DIAGNOSIS — F1010 Alcohol abuse, uncomplicated: Secondary | ICD-10-CM | POA: Diagnosis not present

## 2014-04-16 DIAGNOSIS — I2699 Other pulmonary embolism without acute cor pulmonale: Secondary | ICD-10-CM | POA: Diagnosis not present

## 2014-04-16 DIAGNOSIS — Z79899 Other long term (current) drug therapy: Secondary | ICD-10-CM | POA: Diagnosis not present

## 2014-04-16 DIAGNOSIS — F17210 Nicotine dependence, cigarettes, uncomplicated: Secondary | ICD-10-CM | POA: Insufficient documentation

## 2014-04-16 DIAGNOSIS — E119 Type 2 diabetes mellitus without complications: Secondary | ICD-10-CM | POA: Insufficient documentation

## 2014-04-16 DIAGNOSIS — N368 Other specified disorders of urethra: Secondary | ICD-10-CM | POA: Insufficient documentation

## 2014-04-16 DIAGNOSIS — Z7901 Long term (current) use of anticoagulants: Secondary | ICD-10-CM | POA: Diagnosis not present

## 2014-04-16 MED ORDER — FUROSEMIDE 40 MG PO TABS: 40.0000 mg | ORAL_TABLET | Freq: Every day | ORAL | Status: DC | PRN

## 2014-04-16 MED ORDER — CARVEDILOL 6.25 MG PO TABS: 9.3750 mg | ORAL_TABLET | Freq: Two times a day (BID) | ORAL | Status: DC

## 2014-04-16 NOTE — Patient Instructions (Addendum)
INCREASE Carvedilol (Coreg) to 1.5 tablets (9.375mg ) twice daily.  Only take Lasix AS NEEDED for weight 175 lbs or greater.  Follow up 1 month.  Do the following things EVERYDAY: 1) Weigh yourself in the morning before breakfast. Write it down and keep it in a log. 2) Take your medicines as prescribed 3) Eat low salt foods-Limit salt (sodium) to 2000 mg per day.  4) Stay as active as you can everyday 5) Limit all fluids for the day to less than 2 liters

## 2014-04-16 NOTE — Progress Notes (Signed)
Patient ID: Matthew Green, male   DOB: [birth year], 49 y.o.   MRN: [id number] PCP: Dr. Birdie Riddle  49 yo with history of tobacco use and heavy ETOH use developed exertional dyspnea and orthopnea about 3 weeks prior to recent admission.  No chest pain.  He was admitted 03/21/14 and found to have a PE.  Lower extremity US showed no DVT.  He had no known trigger: no surgery or prolonged immobility.  His father had a DVT after a leg injury.  As part of the workup, he had an echo showing EF 20-25% with diffuse hypokinesis.  He was noted to be volume overloaded and was diuresed.  He was started on apixaban.  His brother and multiple family members on his mother's side have a history of cardiomyopathy.  Of note, he has been a heavy drinker, up to 12 beers/day, since his divorce.  He was also a heavy smoker until about a month ago when he quit.    He returns for follow up. Last visit carvedilol and lisinopril increased. Overall feeling much better. DeniesSOB/PND/Orthopnea. Able to walk up step without difficulty. No bleeding problems.  Weight at home trending down another 20 pounds. Weight at home down to 167 pounds.  He has not had alcohol or cigarettes. Working full time.  Labs  (03/25/14): K 4.6, creatinine 1.29, HCT 43.5, BNP 987, TnI 0.05, AST/ALT normal, LDL 64, TSH normal, lupus anticoagulant negative, beta-2 glycoprotein negative, anticardiolipin antibody negative.  (03/27/14) Dig level 0.7   PMH: 1. PE: Diagnosed by [facility] in 12/15. Lower extremity US was negative.  There was no trigger.  Lupus anticoagulant negative, beta-2 glycoprotein negative, anticardiolipin antibody negative.  2. Cardiomyopathy: Echo (12/15) with EF 20-25%, mildly dilated LV with diffuse hypokinesis, RV mildly dilated with moderately decreased systolic function, PA systolic pressure 44 mmHg.  3. Distal ureteral dilation on left.   SH: Lives in [place], works as Teacher, adult education, divorced.  Drank 12 beers/day, has now quit.  Smokes  1/2-1 ppd, has now quit.   FH: Father with DVT after leg injury.  Mother, aunts, and brother with cardiomyopathy/CHF.   ROS: All systems reviewed and negative except as per HPI.   Current Outpatient Prescriptions  Medication Sig Dispense Refill  . apixaban (ELIQUIS) 5 MG TABS tablet Take 1 tablet (5 mg total) by mouth 2 (two) times daily. 74 tablet 0  . carvedilol (COREG) 6.25 MG tablet Take 1 tablet (6.25 mg total) by mouth 2 (two) times daily with a meal. 180 tablet 3  . digoxin (LANOXIN) 0.125 MG tablet Take 1 tablet (0.125 mg total) by mouth daily. 30 tablet 0  . furosemide (LASIX) 40 MG tablet Take 1 tablet (40 mg total) by mouth daily. 30 tablet 0  . lisinopril (PRINIVIL,ZESTRIL) 5 MG tablet PLEASE TAKE 5 MG ONCE DAILY 30 tablet 3  . metFORMIN (GLUCOPHAGE) 1000 MG tablet Take 1 tablet (1,000 mg total) by mouth 2 (two) times daily with a meal. 60 tablet 0  . spironolactone (ALDACTONE) 12.5 mg TABS tablet Take 0.5 tablets (12.5 mg total) by mouth daily. 30 tablet 0   No current facility-administered medications for this encounter.   BP 106/70 mmHg  Pulse 80  Wt 174 lb 8 oz (79.153 kg)  SpO2 98% General: NAD Neck: JVP flat m, no thyromegaly or thyroid nodule.  Lungs: Clear to auscultation bilaterally with normal respiratory effort. CV: Nondisplaced PMI.  Heart regular S1/S2, no S3/S4, no murmur.  No edema.  No carotid bruit.  Normal  pedal pulses.  Abdomen: Soft, nontender, no hepatosplenomegaly, no distention.  Skin: Intact without lesions or rashes.  Neurologic: Alert and oriented x 3.  Psych: Normal affect. Extremities: No clubbing or cyanosis.  HEENT: Normal.   Assessment/Plan: 1. PE: Patient developed a PE with no obvious trigger. His father had a DVT but it seems to have happened after a leg injury.  Initial hypercoagulable workup negative, awaiting return of factor V Leiden and prothrombin gene mutation test.   - He will continue apixaban 5 mg bid.  Given spontaneous DVT,  he likely will need to continue this long-term.  We can likely decrease apixaban to 2.5 mg bid after 6 months.   2. Chronic systolic CHF: Suspect nonischemic cardiomyopathy.  Heavy ETOH use may contribute to cardiomyopathy.  However, he does have a strong family history of CMP (brother, mother and members of mother's family).  This may be a familial cardiomyopathy. ECHO 03/22/14 EF 20-25% on echo.   NYHA class I-II symptoms currently. Weight down 20 pounds since last visit. Volume status low. Change lasix to as needed for weight 175 or greater.   - Increase Coreg to 9.375 mg mg bid - Continue lisinopril to 5 mg daily - Continue digoxin and spironolactone.  -Cardiac MRI set up for 04/18/14 to assess for evidence of infiltrative disease or myocarditis.  3. Ureteral dilation: Noted on CT while in hospital.  He will need non-urgent urology evaluation.  4. Diabetes Mellitus- Hgb A1c 8.7. On metformin 1000 mg twice a day.Has follow up with Dr Birdie Riddle next week.  5. ETOH- former heavy drinker. Has not had alchol 1 month. Congratulated.   Follow up in 4 weeks.   CLEGG,AMY NP-C  04/16/2014

## 2014-04-16 NOTE — Progress Notes (Signed)
Advanced Heart Failure Medication Review by a Pharmacist  Does the patient  feel that his/her medications are working for him/her?  yes  Has the patient been experiencing any side effects to the medications prescribed?  no  Does the patient measure his/her own blood pressure or blood glucose at home?  yes   Does the patient have any problems obtaining medications due to transportation or finances?   no  Understanding of regimen: excellent Understanding of indications: excellent Potential of compliance: excellent    Pharmacist comments:  Mr. Matthew Green is a very pleasant 49 yo M arriving with his medication bottles. He denies any adverse effects from any of his medications and has an excellent understanding of the importance of continued consistent use of all of his medications. He did state that his blood glucose levels at home have consistently been <120 mg/dL and was wondering if he would need to continue his metformin. I told him that he should discuss this with his primary physician at his next appointment. No other medication concerns were identified during this visit.   Ruta Hinds. Velva Harman, PharmD Clinical Pharmacist - Resident Pager: [phone] Pharmacy: [phone] 04/16/2014 10:52 AM

## 2014-04-17 NOTE — Telephone Encounter (Signed)
Open in error

## 2014-04-18 ENCOUNTER — Ambulatory Visit (HOSPITAL_COMMUNITY)
Admission: RE | Admit: 2014-04-18 | Discharge: 2014-04-18 | Disposition: A | Discharge status: Home / Self Care | Payer: BLUE CROSS/BLUE SHIELD | Source: Ambulatory Visit | Attending: Cardiology | Admitting: Cardiology

## 2014-04-18 DIAGNOSIS — I5022 Chronic systolic (congestive) heart failure: Secondary | ICD-10-CM

## 2014-04-18 DIAGNOSIS — I429 Cardiomyopathy, unspecified: Secondary | ICD-10-CM

## 2014-04-18 MED ORDER — GADOBENATE DIMEGLUMINE 529 MG/ML IV SOLN
20.0000 mL | Freq: Once | INTRAVENOUS | Status: AC
  Administered 2014-04-18: 35 mL via INTRAVENOUS

## 2014-04-21 ENCOUNTER — Other Ambulatory Visit (HOSPITAL_COMMUNITY): Payer: Self-pay

## 2014-04-21 MED ORDER — DIGOXIN 125 MCG PO TABS: 0.1250 mg | ORAL_TABLET | Freq: Every day | ORAL | Status: DC

## 2014-04-21 MED ORDER — SPIRONOLACTONE 25 MG PO TABS: 12.5000 mg | ORAL_TABLET | Freq: Every day | ORAL | Status: DC

## 2014-04-21 MED ORDER — METFORMIN HCL 1000 MG PO TABS: 1000.0000 mg | ORAL_TABLET | Freq: Two times a day (BID) | ORAL | Status: DC

## 2014-04-21 MED ORDER — APIXABAN 5 MG PO TABS: 5.0000 mg | ORAL_TABLET | Freq: Two times a day (BID) | ORAL | Status: DC

## 2014-04-22 ENCOUNTER — Other Ambulatory Visit: Payer: Self-pay | Admitting: Cardiology

## 2014-04-24 ENCOUNTER — Encounter: Payer: Self-pay | Admitting: *Deleted

## 2014-04-24 ENCOUNTER — Telehealth: Payer: Self-pay | Admitting: *Deleted

## 2014-04-24 NOTE — Telephone Encounter (Signed)
Pre-Visit Call:   Reviewed allergies, medications, health history, and health maintenance with patient and made changes as necessary.   Preferred pharmacy: [id number] - [place], [place]  Flu- 02/09/14 Td- none Pna- none Zoster- none CCS- none PSA- none  No questions or concerns at this time.

## 2014-04-25 ENCOUNTER — Ambulatory Visit (INDEPENDENT_AMBULATORY_CARE_PROVIDER_SITE_OTHER): Payer: BLUE CROSS/BLUE SHIELD | Admitting: Family Medicine

## 2014-04-25 ENCOUNTER — Encounter: Payer: Self-pay | Admitting: Family Medicine

## 2014-04-25 VITALS — BP 112/78 | HR 80 | Temp 98.1°F | Resp 16 | Ht 69.0 in | Wt 172.1 lb

## 2014-04-25 DIAGNOSIS — I509 Heart failure, unspecified: Secondary | ICD-10-CM

## 2014-04-25 DIAGNOSIS — I10 Essential (primary) hypertension: Secondary | ICD-10-CM

## 2014-04-25 DIAGNOSIS — E119 Type 2 diabetes mellitus without complications: Secondary | ICD-10-CM

## 2014-04-25 DIAGNOSIS — I50810 Right heart failure, unspecified: Secondary | ICD-10-CM

## 2014-04-25 DIAGNOSIS — Z23 Encounter for immunization: Secondary | ICD-10-CM

## 2014-04-25 LAB — BASIC METABOLIC PANEL
BUN: 17 mg/dL (ref 6–23)
CO2: 27 meq/L (ref 19–32)
Calcium: 9.6 mg/dL (ref 8.4–10.5)
Chloride: 100 mEq/L (ref 96–112)
Creat: 0.91 mg/dL (ref 0.50–1.35)
Glucose, Bld: 85 mg/dL (ref 70–99)
Potassium: 4.2 mEq/L (ref 3.5–5.3)
SODIUM: 135 meq/L (ref 135–145)

## 2014-04-25 LAB — HEPATIC FUNCTION PANEL
ALK PHOS: 61 U/L (ref 39–117)
ALT: 34 U/L (ref 0–53)
AST: 24 U/L (ref 0–37)
Albumin: 3.9 g/dL (ref 3.5–5.2)
BILIRUBIN DIRECT: 0.2 mg/dL (ref 0.0–0.3)
Indirect Bilirubin: 0.4 mg/dL (ref 0.2–1.2)
Total Bilirubin: 0.6 mg/dL (ref 0.2–1.2)
Total Protein: 6.5 g/dL (ref 6.0–8.3)

## 2014-04-25 MED ORDER — ONETOUCH DELICA LANCETS FINE MISC: Status: DC

## 2014-04-25 MED ORDER — GLUCOSE BLOOD VI STRP: 1.0000 | ORAL_STRIP | Status: DC | PRN

## 2014-04-25 NOTE — Progress Notes (Signed)
Pre visit review using our clinic review tool, if applicable. No additional management support is needed unless otherwise documented below in the visit note. 

## 2014-04-25 NOTE — Assessment & Plan Note (Signed)
New dx as of last month.  Initially A1C 8.7  Started on Metformin 1000mg  BID.  Based on low home CBGs, will decrease Metformin to 500mg  BID.  Pt has completely changed diet and is now able to exercise again.  Has quit smoking and drinking.  Pt plans to schedule eye exam.  On ACE for renal protection.  Check BMP today since starting Metformin.  Will repeat A1C in 3 months.  Pt expressed understanding and is in agreement w/ plan.

## 2014-04-25 NOTE — Assessment & Plan Note (Signed)
New to provider, ongoing for pt.  Excellent control today.  Asymptomatic.  Check labs.  No anticipated med changes.

## 2014-04-25 NOTE — Progress Notes (Signed)
   Subjective:    Patient ID: Matthew Green, male    DOB: 12-Apr-1965, 49 y.o.   MRN: [id number]  HPI New to establish.  Cards- [place]le[place]/Bensimhon  HTN- chronic problem, pt had this prior to hospitalization.  Now on regimen of Lisinopril, Lasix, Coreg, Spironolactone.  CHF- new dx as of 02/2014 when he was admitted to hospital.  Now on Dig, Spironolactone, Coreg, Lasix, Lisinopril, Eliquis.  No SOB, CP, edema.  EF 20-25%  DM- pt went into hospital at 212 and is now 172.  Has stopped smoking and drinking.  Has met w/ nutrition.  Currently on Metformin due to new dx while hospitalized- A1C 8.7.  On 1000 mg BID.  Intermittent diarrhea.  No abd pain, N/V, numbness/tingling of hands/feet.  + blurry vision.  Has been using mom's meter and CBGs 'haven't been over 125 since leaving hospital'.  CBG today was 81.  Has completely changed diet.  Due for eye exam.  Strong family hx- both mom and dad.   Review of Systems For ROS see HPI   Reviewed meds, allergies, problem list, and PMH in chart     Objective:   Physical Exam  Constitutional: He is oriented to person, place, and time. He appears well-developed and well-nourished. No distress.  HENT:  Head: Normocephalic and atraumatic.  Eyes: Conjunctivae and EOM are normal. Pupils are equal, round, and reactive to light.  Neck: Normal range of motion. Neck supple. No thyromegaly present.  Cardiovascular: Normal rate, regular rhythm, normal heart sounds and intact distal pulses.   No murmur heard. Pulmonary/Chest: Effort normal and breath sounds normal. No respiratory distress.  Abdominal: Soft. Bowel sounds are normal. He exhibits no distension.  Musculoskeletal: He exhibits no edema.  Lymphadenopathy:    He has no cervical adenopathy.  Neurological: He is alert and oriented to person, place, and time. No cranial nerve deficit.  Skin: Skin is warm and dry.  Psychiatric: He has a normal mood and affect. His behavior is normal.  Vitals  reviewed.         Assessment & Plan:

## 2014-04-25 NOTE — Patient Instructions (Signed)
Follow up in late March/early April to recheck diabetes We'll notify you of your lab results and make any changes if needed Decrease the Metformin to 1/2 tab twice daily Given your family history, ask if there is a genetic component to your CHF Continue to make healthy food choices and get regular exercise- you can do this! Congrats on quitting smoking! Call with any questions or concerns Welcome!  We're glad to have you!

## 2014-04-25 NOTE — Progress Notes (Signed)
   Subjective:    Patient ID: Matthew Green, male    DOB: [birth year], 49 y.o.   MRN: [id number]  HPI    Review of Systems  Reviewed meds, allergies, problem list, and PMH in chart     Objective:   Physical Exam        Assessment & Plan:

## 2014-04-25 NOTE — Assessment & Plan Note (Signed)
New to provider and for pt.  Was admitted last month w/ EF of 20-25%.  Is now on BALDS tx plan and asymptomatic.  Will follow along and assist as able.

## 2014-04-29 LAB — HM DIABETES EYE EXAM

## 2014-05-01 ENCOUNTER — Encounter (HOSPITAL_COMMUNITY)
Admission: RE | Admit: 2014-05-01 | Discharge: 2014-05-01 | Disposition: A | Discharge status: Home / Self Care | Payer: BLUE CROSS/BLUE SHIELD | Source: Ambulatory Visit | Attending: Cardiology | Admitting: Cardiology

## 2014-05-01 DIAGNOSIS — I428 Other cardiomyopathies: Secondary | ICD-10-CM | POA: Insufficient documentation

## 2014-05-01 DIAGNOSIS — Z5189 Encounter for other specified aftercare: Secondary | ICD-10-CM | POA: Insufficient documentation

## 2014-05-01 DIAGNOSIS — I509 Heart failure, unspecified: Secondary | ICD-10-CM | POA: Insufficient documentation

## 2014-05-01 NOTE — Progress Notes (Signed)
Cardiac Rehab Medication Review by a Pharmacist  Does the patient  feel that his/her medications are working for him/her?  yes  Has the patient been experiencing any side effects to the medications prescribed?  no  Does the patient measure his/her own blood pressure or blood glucose at home?  yes - BG daily and BP couple times a week at work  Does the patient have any problems obtaining medications due to transportation or finances?   no  Understanding of regimen: excellent Understanding of indications: good Potential of compliance: good    Pharmacist comments:  Pleasant 49 yo male presents to cardiac rehab orientation unassisted. Patient has excellent understanding of what medications he is taking. Indications for each medication were reviewed. Patient does not think he is experiencing any side effects, was having some blurred vision but went to eye doctor and they did not find anything wrong. Spoke with patient about possiblity of BP or BG getting low and causing some vision impairment. Pt explains that he recently got a new BG meter and it has been about 30 units higher than his last one, he used them both back to back and is still having the issue. Patient was instructed to continue for another week and if still having issues to contact the MD or meter manufacturer. All questions were answered during our time.   Thank you for allowing pharmacy to be part of this patient's care team  Delores Edelstein M. Kaetlyn Noa, Pharm.D Clinical Pharmacy Resident Pager: [phone] 05/01/2014 .8:29 AM

## 2014-05-05 ENCOUNTER — Encounter (HOSPITAL_COMMUNITY): Payer: BLUE CROSS/BLUE SHIELD

## 2014-05-06 ENCOUNTER — Encounter: Payer: Self-pay | Admitting: General Practice

## 2014-05-07 ENCOUNTER — Encounter (HOSPITAL_COMMUNITY)
Admission: RE | Admit: 2014-05-07 | Discharge: 2014-05-07 | Disposition: A | Discharge status: Home / Self Care | Payer: BLUE CROSS/BLUE SHIELD | Source: Ambulatory Visit | Attending: Cardiology | Admitting: Cardiology

## 2014-05-07 DIAGNOSIS — I428 Other cardiomyopathies: Secondary | ICD-10-CM | POA: Diagnosis not present

## 2014-05-07 DIAGNOSIS — I509 Heart failure, unspecified: Secondary | ICD-10-CM | POA: Diagnosis not present

## 2014-05-07 DIAGNOSIS — Z5189 Encounter for other specified aftercare: Secondary | ICD-10-CM | POA: Diagnosis present

## 2014-05-07 LAB — GLUCOSE, CAPILLARY: Glucose-Capillary: 92 mg/dL (ref 70–99)

## 2014-05-07 NOTE — Progress Notes (Signed)
CBG 92.  Patient did not exercise today due to CBG less than 100. Patient ate lunch at 11:30 given a banana. Patient plans to eat a snack or have some protein prior to coming to exercise on Thursday.  PHQ score =0.

## 2014-05-08 ENCOUNTER — Telehealth (HOSPITAL_COMMUNITY): Payer: Self-pay | Admitting: Vascular Surgery

## 2014-05-08 MED ORDER — LISINOPRIL 5 MG PO TABS: ORAL_TABLET | ORAL | Status: DC

## 2014-05-08 MED ORDER — SPIRONOLACTONE 25 MG PO TABS: 12.5000 mg | ORAL_TABLET | Freq: Every day | ORAL | Status: DC

## 2014-05-08 NOTE — Telephone Encounter (Signed)
Refill Lisinpril , spironolactone

## 2014-05-09 ENCOUNTER — Encounter (HOSPITAL_COMMUNITY)
Admission: RE | Admit: 2014-05-09 | Discharge: 2014-05-09 | Disposition: A | Discharge status: Home / Self Care | Payer: BLUE CROSS/BLUE SHIELD | Source: Ambulatory Visit | Attending: Cardiology | Admitting: Cardiology

## 2014-05-09 ENCOUNTER — Telehealth: Payer: Self-pay | Admitting: *Deleted

## 2014-05-09 DIAGNOSIS — Z5189 Encounter for other specified aftercare: Secondary | ICD-10-CM | POA: Diagnosis not present

## 2014-05-09 MED ORDER — METFORMIN HCL 500 MG PO TABS: ORAL_TABLET | ORAL | Status: DC

## 2014-05-09 NOTE — Telephone Encounter (Signed)
Notified patient of medication change.  Patient states he currently has 1000mg  tabs (so can not be cut in half).  [facility] 500[facility] [address] [facility] request.  eal

## 2014-05-09 NOTE — Telephone Encounter (Signed)
[name], RN from Cardiac Rehab called about patient.  She states that he has recently lost 40 pounds, and so his blood sugars have been much lower recently.  Today it was 95, but per their protocol they do not exercise if the CBG is less than 100.  On the previous visit, it was 47.  They have a diabetic educator at the facility who was wondering if he could cut back on the Metformin?  She states his last A1C (03/22/14) was 8.7 but patient reports that his blood sugars have been lower lately.    Please advise.

## 2014-05-09 NOTE — Progress Notes (Signed)
CBG 95.  Matthew Green ate lunch at 12:30.  Patient will not exercise today due to cardiac rehab protocol.  Matthew Green is taking 500 mg of metformin twice a day.  Dr Virgil Benedict office called and notified spoke with Caryl Pina. Caryl Pina will discuss with Dr Birdie Riddle and call the patient with further instructions.

## 2014-05-09 NOTE — Telephone Encounter (Signed)
Can decrease metformin to 1/2 tab twice daily and continue to monitor CBGs.  If consistently >130, will need to increase back to 500mg  BID

## 2014-05-09 NOTE — Addendum Note (Signed)
Addended by: Leticia Penna A on: 05/09/2014 04:52 PM   Modules accepted: Orders, Medications

## 2014-05-09 NOTE — Progress Notes (Signed)
Nutrition Note Spoke with pt. Pt has lost "46 lb since Christmas" and is now "eating healthier and exercising." Pre-exercise CBG's have been too low to exercise. Post-prandial CBG reportedly 147 mg/dL. Feel pt may benefit from an A1c re-check. ? If pt may benefit from a trial of 500 mg Metformin at night. Pt encouraged to eat a pre-exercise snack right before exercise. Pt expressed understanding of the information reviewed. Continue client-centered nutrition education by RD as part of interdisciplinary care.  Monitor and evaluate progress toward nutrition goal with team.  Derek Mound, M.Ed, RD, LDN, CDE 05/09/2014 3:28 PM

## 2014-05-12 ENCOUNTER — Encounter (HOSPITAL_COMMUNITY)
Admission: RE | Admit: 2014-05-12 | Discharge: 2014-05-12 | Disposition: A | Discharge status: Home / Self Care | Payer: BLUE CROSS/BLUE SHIELD | Source: Ambulatory Visit | Attending: Cardiology | Admitting: Cardiology

## 2014-05-12 DIAGNOSIS — Z5189 Encounter for other specified aftercare: Secondary | ICD-10-CM | POA: Diagnosis not present

## 2014-05-12 LAB — GLUCOSE, CAPILLARY
Glucose-Capillary: 95 mg/dL (ref 70–99)
Glucose-Capillary: 143 mg/dL — ABNORMAL HIGH (ref 70–99)
Glucose-Capillary: 99 mg/dL (ref 70–99)

## 2014-05-12 NOTE — Progress Notes (Signed)
Pt started cardiac rehab today.  Pt tolerated light exercise without difficulty. Telemetry rhythm Sinus. Vital signs stable. Matthew Green's short term goals are to maintain weight and increase heart strength. Matthew Green's long term goals are to improve diabetes response.

## 2014-05-13 ENCOUNTER — Ambulatory Visit: Payer: BLUE CROSS/BLUE SHIELD | Admitting: Cardiovascular Disease

## 2014-05-14 ENCOUNTER — Encounter (HOSPITAL_COMMUNITY)
Admission: RE | Admit: 2014-05-14 | Discharge: 2014-05-14 | Disposition: A | Discharge status: Home / Self Care | Payer: BLUE CROSS/BLUE SHIELD | Source: Ambulatory Visit | Attending: Cardiology | Admitting: Cardiology

## 2014-05-14 DIAGNOSIS — Z5189 Encounter for other specified aftercare: Secondary | ICD-10-CM | POA: Diagnosis not present

## 2014-05-14 LAB — GLUCOSE, CAPILLARY
Glucose-Capillary: 135 mg/dL — ABNORMAL HIGH (ref 70–99)
Glucose-Capillary: 90 mg/dL (ref 70–99)

## 2014-05-16 ENCOUNTER — Encounter (HOSPITAL_COMMUNITY)
Admission: RE | Admit: 2014-05-16 | Discharge: 2014-05-16 | Disposition: A | Discharge status: Home / Self Care | Payer: BLUE CROSS/BLUE SHIELD | Source: Ambulatory Visit | Attending: Cardiology | Admitting: Cardiology

## 2014-05-16 DIAGNOSIS — Z5189 Encounter for other specified aftercare: Secondary | ICD-10-CM | POA: Diagnosis not present

## 2014-05-16 LAB — GLUCOSE, CAPILLARY: GLUCOSE-CAPILLARY: 106 mg/dL — AB (ref 70–99)

## 2014-05-19 ENCOUNTER — Ambulatory Visit (HOSPITAL_COMMUNITY)
Admission: RE | Admit: 2014-05-19 | Discharge: 2014-05-19 | Disposition: A | Discharge status: Home / Self Care | Payer: BLUE CROSS/BLUE SHIELD | Source: Ambulatory Visit | Attending: Internal Medicine | Admitting: Internal Medicine

## 2014-05-19 ENCOUNTER — Encounter (HOSPITAL_COMMUNITY)
Admission: RE | Admit: 2014-05-19 | Discharge: 2014-05-19 | Disposition: A | Discharge status: Home / Self Care | Payer: BLUE CROSS/BLUE SHIELD | Source: Ambulatory Visit | Attending: Cardiology | Admitting: Cardiology

## 2014-05-19 VITALS — BP 114/72 | HR 82 | Wt 169.8 lb

## 2014-05-19 DIAGNOSIS — I5022 Chronic systolic (congestive) heart failure: Secondary | ICD-10-CM

## 2014-05-19 DIAGNOSIS — I2699 Other pulmonary embolism without acute cor pulmonale: Secondary | ICD-10-CM

## 2014-05-19 DIAGNOSIS — Z5189 Encounter for other specified aftercare: Secondary | ICD-10-CM | POA: Diagnosis not present

## 2014-05-19 LAB — GLUCOSE, CAPILLARY
GLUCOSE-CAPILLARY: 165 mg/dL — AB (ref 70–99)
Glucose-Capillary: 123 mg/dL — ABNORMAL HIGH (ref 70–99)
Glucose-Capillary: 109 mg/dL — ABNORMAL HIGH (ref 70–99)

## 2014-05-19 MED ORDER — SPIRONOLACTONE 25 MG PO TABS: 25.0000 mg | ORAL_TABLET | Freq: Every day | ORAL | Status: DC

## 2014-05-19 MED ORDER — CARVEDILOL 12.5 MG PO TABS: 12.5000 mg | ORAL_TABLET | Freq: Two times a day (BID) | ORAL | Status: DC

## 2014-05-19 NOTE — Patient Instructions (Signed)
Increase Carvedilol to 12.5 mg Twice daily, we sent in a new prescription for 12.5 mg tablets  Increase Spironolactone to 25 mg (1 tab) daily  Labs in 10 days (bmet, bnp)  Your physician recommends that you schedule a follow-up appointment in: 6 weeks

## 2014-05-19 NOTE — Progress Notes (Signed)
Reviewed home exercise with pt today.  Pt plans to walk and use total gym at home for exercise.  Reviewed THR, pulse, RPE, sign and symptoms, and when to call 911 or MD.  Pt voiced understanding. Alberteen Sam, MA, ACSM RCEP

## 2014-05-19 NOTE — Progress Notes (Signed)
Patient ID: Matthew Green, male   DOB: 06-12-1965, 49 y.o.   MRN: [id number] PCP: Dr. Birdie Riddle  49 yo with history of tobacco use and heavy ETOH use developed exertional dyspnea and orthopnea about 3 weeks prior to recent admission.  No chest pain.  He was admitted 03/21/14 and found to have a PE.  Lower extremity US showed no DVT.  He had no known trigger: no surgery or prolonged immobility.  His father had a DVT after a leg injury.  As part of the workup, he had an echo showing EF 20-25% with diffuse hypokinesis.  He was noted to be volume overloaded and was diuresed.  He was started on apixaban.  His brother and multiple family members on his mother's side have a history of cardiomyopathy.  Of note, he has been a heavy drinker, up to 12 beers/day, since his divorce.  He was also a heavy smoker until around the time of his CHF admission, when he quit.    He returns for follow up.  He is working full time with no problems.  Overall feeling much better. Denies SOB/PND/orthopnea. Able to walk up steps without difficulty. No bleeding problems.  Weight down 5 lbs.  He is eating better and has cut back a lot on sodium intake.  He has not had alcohol or cigarettes.  He has not used any Lasix. He is going to cardiac rehab.   He saw urology and had a cystoscopy.  He has a congenital ureterocele.   Labs  (03/25/14): K 4.6, creatinine 1.29, HCT 43.5, BNP 987, TnI 0.05, AST/ALT normal, LDL 64, TSH normal, lupus anticoagulant negative, beta-2 glycoprotein negative, anticardiolipin antibody negative. Factor V Leiden homozygote.  Negative for prothrombin gene mutation.  (03/27/14): Dig level 0.7  (1/16): K 4.2, creatinine 0.9, LFTs normal  PMH: 1. PE: Diagnosed by CTA in 12/15. Lower extremity US was negative.  There was no trigger.  Lupus anticoagulant negative, beta-2 glycoprotein negative, anticardiolipin antibody negative, prothrombin gene mutation negative.  Factor V Leiden heterozygote.  2. Cardiomyopathy:  Echo (12/15) with EF 20-25%, mildly dilated LV with diffuse hypokinesis, RV mildly dilated with moderately decreased systolic function, PA systolic pressure 44 mmHg. Cardiac MRI (1/16) with LV EF 34%, global hypokinesis; RV moderately hypokinetic, EF 30%, possible subtle mid-wall LGE in the anterior wall (not a coronary pattern).  3. 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 .   SH: Lives in [place], works as Teacher, adult education, divorced.  Drank 12 beers/day, has now quit.  Smokes 1/2-1 ppd, has now quit.   FH: Father with DVT after leg injury.  Mother, aunts, and brother with cardiomyopathy/CHF.   ROS: All systems reviewed and negative except as per HPI.   Current Outpatient Prescriptions  Medication Sig Dispense Refill  . apixaban (ELIQUIS) 5 MG TABS tablet Take 1 tablet (5 mg total) by mouth 2 (two) times daily. 60 tablet 3  . carvedilol (COREG) 12.5 MG tablet Take 1 tablet (12.5 mg total) by mouth 2 (two) times daily with a meal. 60 tablet 3  . digoxin (LANOXIN) 0.125 MG tablet Take 1 tablet (0.125 mg total) by mouth daily. 30 tablet 3  . furosemide (LASIX) 40 MG tablet Take 1 tablet (40 mg total) by mouth daily as needed (For weight 175 lbs or more). 30 tablet 0  . glucose blood test strip 1 each by Other route as needed for other. Test sugars once daily. E11.9 50 each  6  . lisinopril (PRINIVIL,ZESTRIL) 5 MG tablet PLEASE TAKE 5 MG ONCE DAILY 30 tablet 3  . metFORMIN (GLUCOPHAGE) 500 MG tablet Take 1/2 tablet (250mg )  two times daily. 30 tablet 0  . ONETOUCH DELICA LANCETS FINE MISC Test sugars once daily. E11.9 100 each 6  . spironolactone (ALDACTONE) 25 MG tablet Take 1 tablet (25 mg total) by mouth daily. 30 tablet 3   No current facility-administered medications for this encounter.   BP 114/72 mmHg  Pulse 82  Wt 169 lb 12 oz (76.998 kg)  SpO2 99% General: NAD Neck: JVP flat, no thyromegaly or thyroid nodule.  Lungs: Clear to  auscultation bilaterally with normal respiratory effort. CV: Nondisplaced PMI.  Heart regular S1/S2, no S3/S4, no murmur.  No edema.  No carotid bruit.  Normal pedal pulses.  Abdomen: Soft, nontender, no hepatosplenomegaly, no distention.  Skin: Intact without lesions or rashes.  Neurologic: Alert and oriented x 3.  Psych: Normal affect. Extremities: No clubbing or cyanosis.  HEENT: Normal.   Assessment/Plan: 1. PE: Patient developed a PE with no obvious trigger. His father had a DVT but it seems to have happened after a leg injury. He is a Factor V Leiden heterozygote. - He will continue apixaban 5 mg bid.  Given spontaneous DVT, he likely will need to continue this long-term.  We can decrease apixaban to 2.5 mg bid after 6 months.   2. Chronic systolic CHF: Suspect nonischemic cardiomyopathy.  Heavy ETOH use may contribute to cardiomyopathy.  However, he does have a strong family history of CMP (brother, mother and members of mother's family).  This may be a familial cardiomyopathy. ECHO 03/22/14 EF 20-25%.  Cardiac MRI in 1/16 showed LV EF 34%, RV EF 30%.  There was subtle mid-wall anterior LGE.  This did not appear to be a CAD pattern.  NYHA class I-II symptoms currently. Weight continues to fall.  - He will continue to use Lasix only prn.   - Increase Coreg to 12.5 mg bid.  - Continue lisinopril to 5 mg daily - Continue digoxin. - Increase spironolactone to 25 mg daily with BMET/BNP in 10 days.  - Cardiac MRI not suggestive of coronary disease, possible subtle mid-wall LGE.  Repeat echo in 5/16 (6 months post-initial echo).  If EF is not considerably improved, will need to get cardiac cath at that time (as this is not urgent, will hold off until he has been anticoagulated 6 months for PE).  - Son and daughter should have echoes given concern for familial cardiomyopathy.  3. Ureteral dilation: Noted on CT while in hospital.  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  4. Diabetes Mellitus: Working on  this with PCP.  5. ETOH: former heavy drinker. He continues to abstain from ETOH.   Follow up in 6 weeks.   [name] 05/19/2014

## 2014-05-21 ENCOUNTER — Telehealth: Payer: Self-pay | Admitting: Family Medicine

## 2014-05-21 ENCOUNTER — Encounter (HOSPITAL_COMMUNITY)
Admission: RE | Admit: 2014-05-21 | Discharge: 2014-05-21 | Disposition: A | Discharge status: Home / Self Care | Payer: BLUE CROSS/BLUE SHIELD | Source: Ambulatory Visit | Attending: Cardiology | Admitting: Cardiology

## 2014-05-21 DIAGNOSIS — Z5189 Encounter for other specified aftercare: Secondary | ICD-10-CM | POA: Diagnosis not present

## 2014-05-21 LAB — GLUCOSE, CAPILLARY
GLUCOSE-CAPILLARY: 148 mg/dL — AB (ref 70–99)
GLUCOSE-CAPILLARY: 104 mg/dL — AB (ref 70–99)
GLUCOSE-CAPILLARY: 76 mg/dL (ref 70–99)

## 2014-05-21 NOTE — Telephone Encounter (Signed)
Caller name: Matthew Green Relation to pt: From Cardiac Rehab Call back number: [phone] Pharmacy:  Reason for call: Matthew Green works at the cardiac Rehab and had pt Matthew Green come in there office today 2-.24-16 2:30pm states that pt had sugar 148 before exercising after exercising his sugar level was at 76, she states gave him a lemonade and pt sugar went up to 104. Wants to know if pt needs to have his med changed and if so to inform the pt that he is needing to have a new prescription. Matthew Green stated will fax information of status of pt to our office. Please advise.

## 2014-05-21 NOTE — Progress Notes (Signed)
Pre exercise CBG 141. Post exercise CBG 76. Patient asymptomatic. Patient given lemonade. Recheck CBG 104. No complaints upon exit from cardiac rehab. Patient is taking 250 mg of metformin twice a day. Will fax exercise flow sheets to Dr. Virgil Benedict office for review.

## 2014-05-22 NOTE — Telephone Encounter (Signed)
Spoke with Matthew Green who stated that patient was not symptomatic at 61, but that she gave him lemonade to prevent him from dropping too low.  She called to suggest maybe decreasing his metformin even more.  Pt is scheduled again for Cardiac Rehab tomorrow (05/22/14) at 2:45 pm.  Please advise.

## 2014-05-23 ENCOUNTER — Telehealth: Payer: Self-pay | Admitting: Family Medicine

## 2014-05-23 ENCOUNTER — Telehealth (HOSPITAL_COMMUNITY): Payer: Self-pay | Admitting: Family Medicine

## 2014-05-23 ENCOUNTER — Encounter (HOSPITAL_COMMUNITY): Payer: BLUE CROSS/BLUE SHIELD

## 2014-05-23 NOTE — Telephone Encounter (Signed)
Left a message for [name] to call back.

## 2014-05-23 NOTE — Telephone Encounter (Signed)
We expect there to be a sugar drop w/ exercise.  A sugar of 148 prior to exercise indicates that he needs to continue the 1/2 tab as directed.  He should eat 1/2 way through exercise to avoid dropping low.  76 and asymptomatic is a wonderful reading

## 2014-05-23 NOTE — Telephone Encounter (Signed)
[name] made aware.

## 2014-05-23 NOTE — Telephone Encounter (Signed)
Pt was encouraged to call pharmacy 1st. He stated understanding and agreed he would.

## 2014-05-23 NOTE — Telephone Encounter (Signed)
Caller name: Lang Relation to pt: self Call back number: [phone] Pharmacy: [facility] [place]  Reason for call:   Patient states that he has been using his test strips more frequently and needs a refill.

## 2014-05-26 ENCOUNTER — Encounter (HOSPITAL_COMMUNITY)
Admission: RE | Admit: 2014-05-26 | Discharge: 2014-05-26 | Disposition: A | Discharge status: Home / Self Care | Payer: BLUE CROSS/BLUE SHIELD | Source: Ambulatory Visit | Attending: Cardiology | Admitting: Cardiology

## 2014-05-26 DIAGNOSIS — Z5189 Encounter for other specified aftercare: Secondary | ICD-10-CM | POA: Diagnosis not present

## 2014-05-26 LAB — GLUCOSE, CAPILLARY: Glucose-Capillary: 117 mg/dL — ABNORMAL HIGH (ref 70–99)

## 2014-05-27 LAB — GLUCOSE, CAPILLARY: GLUCOSE-CAPILLARY: 136 mg/dL — AB (ref 70–99)

## 2014-05-28 ENCOUNTER — Telehealth: Payer: Self-pay | Admitting: General Practice

## 2014-05-28 ENCOUNTER — Ambulatory Visit (HOSPITAL_COMMUNITY)
Admission: RE | Admit: 2014-05-28 | Discharge: 2014-05-28 | Disposition: A | Discharge status: Home / Self Care | Payer: BLUE CROSS/BLUE SHIELD | Source: Ambulatory Visit | Attending: Cardiology | Admitting: Cardiology

## 2014-05-28 ENCOUNTER — Encounter (HOSPITAL_COMMUNITY)
Admission: RE | Admit: 2014-05-28 | Discharge: 2014-05-28 | Disposition: A | Discharge status: Home / Self Care | Payer: BLUE CROSS/BLUE SHIELD | Source: Ambulatory Visit | Attending: Cardiology | Admitting: Cardiology

## 2014-05-28 DIAGNOSIS — I5022 Chronic systolic (congestive) heart failure: Secondary | ICD-10-CM | POA: Diagnosis not present

## 2014-05-28 DIAGNOSIS — Z5189 Encounter for other specified aftercare: Secondary | ICD-10-CM | POA: Diagnosis not present

## 2014-05-28 DIAGNOSIS — I428 Other cardiomyopathies: Secondary | ICD-10-CM | POA: Diagnosis not present

## 2014-05-28 DIAGNOSIS — I509 Heart failure, unspecified: Secondary | ICD-10-CM | POA: Diagnosis not present

## 2014-05-28 NOTE — Telephone Encounter (Signed)
Pt notified and expressed an understanding.  

## 2014-05-28 NOTE — Telephone Encounter (Signed)
Since pt is already down to 250mg  BID, it would be ok to hold Metformin prior to Cardiac Rehab on M/W/F and then take his evening dose as he normally would.

## 2014-05-28 NOTE — Progress Notes (Signed)
Pre exercise CBG 141 patient given lemonade.  Post exercise CBG 68. Patient asymptomatic given lemonade. Recheck CBG 107. Dr Virgil Benedict office called and notified. Spoke with Anderson Malta notified of today's events. Anderson Malta will discuss with Dr Birdie Riddle and contact the patient. Will continue to monitor the patient throughout  the program.

## 2014-05-28 NOTE — Telephone Encounter (Signed)
Spoke with Matthew Green with cardiac Rehab who advised that the pt's sugar level was 141 and dropped to 68 during rehab. Pt has his appt M, W, and F at 2:30pm until 3:45-4pm. After pt was given ginger ale the number cam up to 76. [name] is wondering if provider would be willing to drop his metformin dose just on days of cardiac rehab. Please advise?

## 2014-05-29 DIAGNOSIS — Z5189 Encounter for other specified aftercare: Secondary | ICD-10-CM | POA: Diagnosis not present

## 2014-05-29 LAB — BASIC METABOLIC PANEL
Anion gap: 16 — ABNORMAL HIGH (ref 5–15)
BUN: 13 mg/dL (ref 6–23)
CO2: 27 mmol/L (ref 19–32)
Calcium: 9.2 mg/dL (ref 8.4–10.5)
Chloride: 98 mmol/L (ref 96–112)
Creatinine, Ser: 1.02 mg/dL (ref 0.50–1.35)
GFR calc Af Amer: >90 mL/min (ref >90)
GFR, EST NON AFRICAN AMERICAN: 84 mL/min — AB (ref >90)
GLUCOSE: 39 mg/dL — AB (ref 70–99)
Potassium: 3.7 mmol/L (ref 3.5–5.1)
Sodium: 141 mmol/L (ref 135–145)

## 2014-05-29 LAB — GLUCOSE, CAPILLARY
GLUCOSE-CAPILLARY: 107 mg/dL — AB (ref 70–99)
Glucose-Capillary: 141 mg/dL — ABNORMAL HIGH (ref 70–99)
Glucose-Capillary: 68 mg/dL — ABNORMAL LOW (ref 70–99)

## 2014-05-29 LAB — BRAIN NATRIURETIC PEPTIDE: B Natriuretic Peptide: 82.1 pg/mL (ref 0.0–100.0)

## 2014-05-30 ENCOUNTER — Encounter: Payer: Self-pay | Admitting: Family Medicine

## 2014-05-30 ENCOUNTER — Encounter (HOSPITAL_COMMUNITY)
Admission: RE | Admit: 2014-05-30 | Discharge: 2014-05-30 | Disposition: A | Discharge status: Home / Self Care | Payer: BLUE CROSS/BLUE SHIELD | Source: Ambulatory Visit | Attending: Cardiology | Admitting: Cardiology

## 2014-05-30 DIAGNOSIS — Z5189 Encounter for other specified aftercare: Secondary | ICD-10-CM | POA: Diagnosis not present

## 2014-05-30 LAB — GLUCOSE, CAPILLARY
Glucose-Capillary: 117 mg/dL — ABNORMAL HIGH (ref 70–99)
Glucose-Capillary: 107 mg/dL — ABNORMAL HIGH (ref 70–99)

## 2014-06-02 ENCOUNTER — Encounter (HOSPITAL_COMMUNITY)
Admission: RE | Admit: 2014-06-02 | Discharge: 2014-06-02 | Disposition: A | Discharge status: Home / Self Care | Payer: BLUE CROSS/BLUE SHIELD | Source: Ambulatory Visit | Attending: Cardiology | Admitting: Cardiology

## 2014-06-02 DIAGNOSIS — Z5189 Encounter for other specified aftercare: Secondary | ICD-10-CM | POA: Diagnosis not present

## 2014-06-02 LAB — GLUCOSE, CAPILLARY
Glucose-Capillary: 102 mg/dL — ABNORMAL HIGH (ref 70–99)
Glucose-Capillary: 88 mg/dL (ref 70–99)

## 2014-06-04 ENCOUNTER — Encounter (HOSPITAL_COMMUNITY)
Admission: RE | Admit: 2014-06-04 | Discharge: 2014-06-04 | Disposition: A | Discharge status: Home / Self Care | Payer: BLUE CROSS/BLUE SHIELD | Source: Ambulatory Visit | Attending: Cardiology | Admitting: Cardiology

## 2014-06-04 DIAGNOSIS — Z5189 Encounter for other specified aftercare: Secondary | ICD-10-CM | POA: Diagnosis not present

## 2014-06-04 NOTE — Progress Notes (Signed)
Matthew Green 49 y.o. male Nutrition Note Spoke with pt. Nutrition Plan and Nutrition Survey goals reviewed with pt. Pt is following Step 2 of the Therapeutic Lifestyle Changes diet. Pt has lost a significant amount of wt. Pt wt 05/30/14 74.9 kg (164.8 lb). Pt wt is down 30.2 lb over the past 3 months. Rate of wt loss slightly greater than desired rate. Pt is diabetic. Last A1c noted. Suspect A1c is significantly lower now given wt loss, consistent exercise, and better blood sugar control. Pt CBG's are dropping significantly during exercise despite pt eating a good lunch. Pt having to receive a snack frequently after exercise (e.g. 6 out of 9 visits). Pt currently holding Metformin on rehab days. Feel pt may benefit from a trial of holding Metformin completely. This Probation officer went over Diabetes Education test results. Pt expressed understanding of the information reviewed. Pt aware of nutrition education classes offered. Vitals - 1 value per visit 05/19/2014 04/25/2014 04/16/2014 04/03/2014  Weight (lb) 169.75 172.13 174.5 177.5   Vitals - 1 value per visit 03/27/2014 03/25/2014  Weight (lb) 193.12 195   Nutrition Diagnosis ? Food-and nutrition-related knowledge deficit related to lack of exposure to information as related to diagnosis of: ? CVD ? DM (A1c 8.7) ? Overweight related to excessive energy intake as evidenced by a BMI of 26.2  Nutrition RX/ Estimated Daily Nutrition Needs for: wt maintenance 2300-2600 Kcal, 75-85 gm fat, 15-17 gm sat fat, 2.2-2.6 gm trans-fat, <1500 mg sodium, 325 gm CHO   Nutrition Intervention ? Pt's individual nutrition plan reviewed with pt. ? Benefits of adopting Therapeutic Lifestyle Changes discussed when Medficts reviewed. ? Pt to attend the Portion Distortion class       ? Diabetes Q & A class ? Will contact Dr. Birdie Riddle re: ? Hold Metformin ? Pt given handouts for: ? Nutrition I class ? Nutrition II class ? Diabetes Blitz class ? Continue client-centered nutrition  education by RD, as part of interdisciplinary care. Goal(s) ? Pt to describe the benefit of including fruits, vegetables, whole grains, and low-fat dairy products in a heart healthy meal plan. ? CBG concentrations in the normal range or as close to normal as is safely possible. ?  Monitor and Evaluate progress toward nutrition goal with team. Nutrition Risk: Change to Moderate Matthew Green, M.Ed, RD, LDN, CDE 06/04/2014 3:43 PM

## 2014-06-05 ENCOUNTER — Telehealth: Payer: Self-pay | Admitting: General Practice

## 2014-06-05 ENCOUNTER — Telehealth: Payer: Self-pay | Admitting: Family Medicine

## 2014-06-05 LAB — GLUCOSE, CAPILLARY
GLUCOSE-CAPILLARY: 82 mg/dL (ref 70–99)
Glucose-Capillary: 106 mg/dL — ABNORMAL HIGH (ref 70–99)

## 2014-06-05 NOTE — Telephone Encounter (Signed)
Please advise spoke with Cardiac rehab who advised that they are wondering if pt can stop his metformin for the next 3 weeks until he comes in for his Diabetic follow up appt. They advised that Pt's Blood sugar levels were 154 before pt came to rehab, 102 as he walked in the door, 82 after 3 sessions, and once he got home his sugar levels were less than 70. Please advise?

## 2014-06-05 NOTE — Telephone Encounter (Signed)
Pt states he feels fine during rehab sessions.  He denies feeling dizzy, sweaty or shaky. Per Dr. Birdie Riddle, pt was instructed to continue taking Metformin except on the days he goes to cardiac rehab (M, W, and F).  Pt states understanding and agrees with plan.

## 2014-06-05 NOTE — Telephone Encounter (Signed)
°  Please call Cardiac Rehab regarding this patient. [phone]   Regarding blood sugar. See if we can hold his metformin.

## 2014-06-05 NOTE — Telephone Encounter (Signed)
Error

## 2014-06-05 NOTE — Telephone Encounter (Signed)
Pt is not due for repeat A1C until 3/26.  We are already holding his Metformin on the days he has cardiac rehab.  The sugars listed are not dangerous and do not require changes unless he is symptomatic.  Please contact pt and ask how he is feeling during his rehab sessions- does he get dizzy, sweaty, shaky?  If yes, we can stop all Metformin.  If no, he can continue Metformin on the days he doesn't have cardiac rehab but hold on days he goes to rehab

## 2014-06-05 NOTE — Telephone Encounter (Signed)
Called patient and left a message for call back  

## 2014-06-05 NOTE — Progress Notes (Signed)
Nutrition Note Spoke with receptionist at Dr. Virgil Benedict office re: Matthew Green 49 y.o. male. Dr. Virgil Benedict RN notified of frequent snacks/? Hold Metformin until pt's next MD visit in 3 weeks. Will await direction from MD and notify pt prn.  Derek Mound, M.Ed, RD, LDN, CDE

## 2014-06-06 ENCOUNTER — Encounter (HOSPITAL_COMMUNITY): Admission: RE | Admit: 2014-06-06 | Payer: BLUE CROSS/BLUE SHIELD | Source: Ambulatory Visit

## 2014-06-09 ENCOUNTER — Encounter (HOSPITAL_COMMUNITY)
Admission: RE | Admit: 2014-06-09 | Discharge: 2014-06-09 | Disposition: A | Discharge status: Home / Self Care | Payer: BLUE CROSS/BLUE SHIELD | Source: Ambulatory Visit | Attending: Cardiology | Admitting: Cardiology

## 2014-06-09 DIAGNOSIS — Z5189 Encounter for other specified aftercare: Secondary | ICD-10-CM | POA: Diagnosis not present

## 2014-06-09 LAB — GLUCOSE, CAPILLARY
Glucose-Capillary: 108 mg/dL — ABNORMAL HIGH (ref 70–99)
Glucose-Capillary: 78 mg/dL (ref 70–99)

## 2014-06-11 ENCOUNTER — Encounter (HOSPITAL_COMMUNITY)
Admission: RE | Admit: 2014-06-11 | Discharge: 2014-06-11 | Disposition: A | Discharge status: Home / Self Care | Payer: BLUE CROSS/BLUE SHIELD | Source: Ambulatory Visit | Attending: Cardiology | Admitting: Cardiology

## 2014-06-11 DIAGNOSIS — Z5189 Encounter for other specified aftercare: Secondary | ICD-10-CM | POA: Diagnosis not present

## 2014-06-11 LAB — GLUCOSE, CAPILLARY
Glucose-Capillary: 155 mg/dL — ABNORMAL HIGH (ref 70–99)
Glucose-Capillary: 103 mg/dL — ABNORMAL HIGH (ref 70–99)

## 2014-06-13 ENCOUNTER — Encounter (HOSPITAL_COMMUNITY)
Admission: RE | Admit: 2014-06-13 | Discharge: 2014-06-13 | Disposition: A | Discharge status: Home / Self Care | Payer: BLUE CROSS/BLUE SHIELD | Source: Ambulatory Visit | Attending: Cardiology | Admitting: Cardiology

## 2014-06-13 DIAGNOSIS — Z5189 Encounter for other specified aftercare: Secondary | ICD-10-CM | POA: Diagnosis not present

## 2014-06-13 LAB — GLUCOSE, CAPILLARY
Glucose-Capillary: 124 mg/dL — ABNORMAL HIGH (ref 70–99)
Glucose-Capillary: 88 mg/dL (ref 70–99)

## 2014-06-16 ENCOUNTER — Encounter (HOSPITAL_COMMUNITY)
Admission: RE | Admit: 2014-06-16 | Discharge: 2014-06-16 | Disposition: A | Discharge status: Home / Self Care | Payer: BLUE CROSS/BLUE SHIELD | Source: Ambulatory Visit | Attending: Cardiology | Admitting: Cardiology

## 2014-06-16 DIAGNOSIS — Z5189 Encounter for other specified aftercare: Secondary | ICD-10-CM | POA: Diagnosis not present

## 2014-06-16 LAB — GLUCOSE, CAPILLARY: Glucose-Capillary: 92 mg/dL (ref 70–99)

## 2014-06-17 LAB — GLUCOSE, CAPILLARY: GLUCOSE-CAPILLARY: 130 mg/dL — AB (ref 70–99)

## 2014-06-18 ENCOUNTER — Other Ambulatory Visit: Payer: Self-pay | Admitting: Family Medicine

## 2014-06-18 ENCOUNTER — Encounter (HOSPITAL_COMMUNITY)
Admission: RE | Admit: 2014-06-18 | Discharge: 2014-06-18 | Disposition: A | Discharge status: Home / Self Care | Payer: BLUE CROSS/BLUE SHIELD | Source: Ambulatory Visit | Attending: Cardiology | Admitting: Cardiology

## 2014-06-18 DIAGNOSIS — Z5189 Encounter for other specified aftercare: Secondary | ICD-10-CM | POA: Diagnosis not present

## 2014-06-18 LAB — GLUCOSE, CAPILLARY: Glucose-Capillary: 79 mg/dL (ref 70–99)

## 2014-06-19 LAB — GLUCOSE, CAPILLARY: Glucose-Capillary: 127 mg/dL — ABNORMAL HIGH (ref 70–99)

## 2014-06-19 NOTE — Telephone Encounter (Signed)
Med filled.  

## 2014-06-20 ENCOUNTER — Encounter (HOSPITAL_COMMUNITY)
Admission: RE | Admit: 2014-06-20 | Discharge: 2014-06-20 | Disposition: A | Discharge status: Home / Self Care | Payer: BLUE CROSS/BLUE SHIELD | Source: Ambulatory Visit | Attending: Cardiology | Admitting: Cardiology

## 2014-06-20 DIAGNOSIS — Z5189 Encounter for other specified aftercare: Secondary | ICD-10-CM | POA: Diagnosis not present

## 2014-06-20 LAB — GLUCOSE, CAPILLARY: GLUCOSE-CAPILLARY: 98 mg/dL (ref 70–99)

## 2014-06-23 ENCOUNTER — Encounter (HOSPITAL_COMMUNITY)
Admission: RE | Admit: 2014-06-23 | Discharge: 2014-06-23 | Disposition: A | Discharge status: Home / Self Care | Payer: BLUE CROSS/BLUE SHIELD | Source: Ambulatory Visit | Attending: Cardiology | Admitting: Cardiology

## 2014-06-23 DIAGNOSIS — Z5189 Encounter for other specified aftercare: Secondary | ICD-10-CM | POA: Diagnosis not present

## 2014-06-23 LAB — GLUCOSE, CAPILLARY
Glucose-Capillary: 115 mg/dL — ABNORMAL HIGH (ref 70–99)
Glucose-Capillary: 114 mg/dL — ABNORMAL HIGH (ref 70–99)

## 2014-06-24 ENCOUNTER — Encounter: Payer: Self-pay | Admitting: Cardiology

## 2014-06-25 ENCOUNTER — Encounter (HOSPITAL_COMMUNITY)
Admission: RE | Admit: 2014-06-25 | Discharge: 2014-06-25 | Disposition: A | Discharge status: Home / Self Care | Payer: BLUE CROSS/BLUE SHIELD | Source: Ambulatory Visit | Attending: Cardiology | Admitting: Cardiology

## 2014-06-25 DIAGNOSIS — Z5189 Encounter for other specified aftercare: Secondary | ICD-10-CM | POA: Diagnosis not present

## 2014-06-26 LAB — GLUCOSE, CAPILLARY: Glucose-Capillary: 141 mg/dL — ABNORMAL HIGH (ref 70–99)

## 2014-06-27 ENCOUNTER — Ambulatory Visit (HOSPITAL_COMMUNITY)
Admission: RE | Admit: 2014-06-27 | Discharge: 2014-06-27 | Disposition: A | Discharge status: Home / Self Care | Payer: BLUE CROSS/BLUE SHIELD | Source: Ambulatory Visit | Attending: Cardiology | Admitting: Cardiology

## 2014-06-27 ENCOUNTER — Encounter (HOSPITAL_COMMUNITY)
Admission: RE | Admit: 2014-06-27 | Discharge: 2014-06-27 | Disposition: A | Discharge status: Home / Self Care | Payer: BLUE CROSS/BLUE SHIELD | Source: Ambulatory Visit | Attending: Cardiology | Admitting: Cardiology

## 2014-06-27 VITALS — BP 128/76 | HR 71 | Wt 161.0 lb

## 2014-06-27 DIAGNOSIS — N2882 Megaloureter: Secondary | ICD-10-CM | POA: Diagnosis not present

## 2014-06-27 DIAGNOSIS — E119 Type 2 diabetes mellitus without complications: Secondary | ICD-10-CM | POA: Diagnosis not present

## 2014-06-27 DIAGNOSIS — Z5189 Encounter for other specified aftercare: Secondary | ICD-10-CM | POA: Diagnosis present

## 2014-06-27 DIAGNOSIS — Z79899 Other long term (current) drug therapy: Secondary | ICD-10-CM | POA: Diagnosis not present

## 2014-06-27 DIAGNOSIS — I509 Heart failure, unspecified: Secondary | ICD-10-CM | POA: Insufficient documentation

## 2014-06-27 DIAGNOSIS — I428 Other cardiomyopathies: Secondary | ICD-10-CM | POA: Insufficient documentation

## 2014-06-27 DIAGNOSIS — Z7901 Long term (current) use of anticoagulants: Secondary | ICD-10-CM | POA: Insufficient documentation

## 2014-06-27 DIAGNOSIS — Z87891 Personal history of nicotine dependence: Secondary | ICD-10-CM | POA: Insufficient documentation

## 2014-06-27 DIAGNOSIS — I5022 Chronic systolic (congestive) heart failure: Secondary | ICD-10-CM | POA: Diagnosis not present

## 2014-06-27 DIAGNOSIS — I2699 Other pulmonary embolism without acute cor pulmonale: Secondary | ICD-10-CM

## 2014-06-27 DIAGNOSIS — Z86711 Personal history of pulmonary embolism: Secondary | ICD-10-CM | POA: Insufficient documentation

## 2014-06-27 LAB — GLUCOSE, CAPILLARY: GLUCOSE-CAPILLARY: 158 mg/dL — AB (ref 70–99)

## 2014-06-27 MED ORDER — SACUBITRIL-VALSARTAN 24-26 MG PO TABS: 1.0000 | ORAL_TABLET | Freq: Two times a day (BID) | ORAL | Status: DC

## 2014-06-27 NOTE — Patient Instructions (Addendum)
STOP Lisinopril.  START Entresto 24-26 tablet twice daily on Sunday morning April 3rd.  Return in 1-2 weeks for lab work.  Follow up 1 month.  Will schedule echocardiogram end of April.  Do the following things EVERYDAY: 1) Weigh yourself in the morning before breakfast. Write it down and keep it in a log. 2) Take your medicines as prescribed 3) Eat low salt foods-Limit salt (sodium) to 2000 mg per day.  4) Stay as active as you can everyday 5) Limit all fluids for the day to less than 2 liters

## 2014-06-29 NOTE — Progress Notes (Signed)
Patient ID: Matthew Green, male   DOB: 1965/10/14, 49 y.o.   MRN: [id number] PCP: Dr. Birdie Riddle  49 yo with history of tobacco use and heavy ETOH use developed exertional dyspnea and orthopnea about 3 weeks prior to recent admission.  No chest pain.  He was admitted 03/21/14 and found to have a PE.  Lower extremity US showed no DVT.  He had no known trigger: no surgery or prolonged immobility.  His father had a DVT after a leg injury.  As part of the workup, he had an echo showing EF 20-25% with diffuse hypokinesis.  He was noted to be volume overloaded and was diuresed.  He was started on apixaban.  His brother and multiple family members on his mother's side have a history of cardiomyopathy.  Of note, he has been a heavy drinker, up to 12 beers/day, since his divorce.  He was also a heavy smoker until around the time of his CHF admission, when he quit.    He returns for follow up.  He is working full time with no problems.  Overall feeling much better. Denies SOB/PND/orthopnea. Able to walk up steps without difficulty. No bleeding problems.  Weight down another 8 lbs.  He is eating better and has cut back a lot on sodium intake.  He has not had alcohol or cigarettes.  He has not used any Lasix. He is going to cardiac rehab. BP has been ok, tolerating meds.   Labs  (03/25/14): K 4.6, creatinine 1.29, HCT 43.5, BNP 987, TnI 0.05, AST/ALT normal, LDL 64, TSH normal, lupus anticoagulant negative, beta-2 glycoprotein negative, anticardiolipin antibody negative. Factor V Leiden homozygote.  Negative for prothrombin gene mutation.  (03/27/14): Dig level 0.7  (1/16): K 4.2, creatinine 0.9, LFTs normal (3/16): K 3.7, creatinine 1.02, BNP 82  PMH: 1. PE: Diagnosed by CTA in 12/15. Lower extremity US was negative.  There was no trigger.  Lupus anticoagulant negative, beta-2 glycoprotein negative, anticardiolipin antibody negative, prothrombin gene mutation negative.  Factor V Leiden heterozygote.  2.  Cardiomyopathy: Echo (12/15) with EF 20-25%, mildly dilated LV with diffuse hypokinesis, RV mildly dilated with moderately decreased systolic function, PA systolic pressure 44 mmHg. Cardiac MRI (1/16) with LV EF 34%, global hypokinesis; RV moderately hypokinetic, EF 30%, possible subtle mid-wall LGE in the anterior wall (not a coronary pattern).  3. 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 .   SH: Lives in [place], works as Teacher, adult education, divorced.  Drank 12 beers/day, has now quit.  Smokes 1/2-1 ppd, has now quit.   FH: Father with DVT after leg injury.  Mother, aunts, and brother with cardiomyopathy/CHF.   ROS: All systems reviewed and negative except as per HPI.   Current Outpatient Prescriptions  Medication Sig Dispense Refill  . apixaban (ELIQUIS) 5 MG TABS tablet Take 1 tablet (5 mg total) by mouth 2 (two) times daily. 60 tablet 3  . carvedilol (COREG) 12.5 MG tablet Take 1 tablet (12.5 mg total) by mouth 2 (two) times daily with a meal. 60 tablet 3  . digoxin (LANOXIN) 0.125 MG tablet Take 1 tablet (0.125 mg total) by mouth daily. 30 tablet 3  . furosemide (LASIX) 40 MG tablet Take 1 tablet (40 mg total) by mouth daily as needed (For weight 175 lbs or more). 30 tablet 0  . glucose blood test strip 1 each by Other route as needed for other. Test sugars once daily. E11.9 50 each 6  .  metFORMIN (GLUCOPHAGE) 500 MG tablet TAKE 1/2 TABLET (250MG ) TWO TIMES DAILY. 30 tablet 3  . ONETOUCH DELICA LANCETS FINE MISC Test sugars once daily. E11.9 100 each 6  . spironolactone (ALDACTONE) 25 MG tablet Take 1 tablet (25 mg total) by mouth daily. 30 tablet 3  . sacubitril-valsartan (ENTRESTO) 24-26 MG Take 1 tablet by mouth 2 (two) times daily. 60 tablet 3   No current facility-administered medications for this encounter.   BP 128/76 mmHg  Pulse 71  Wt 161 lb (73.029 kg)  SpO2 100% General: NAD Neck: JVP flat, no thyromegaly or thyroid  nodule.  Lungs: Clear to auscultation bilaterally with normal respiratory effort. CV: Nondisplaced PMI.  Heart regular S1/S2, no S3/S4, no murmur.  No edema.  No carotid bruit.  Normal pedal pulses.  Abdomen: Soft, nontender, no hepatosplenomegaly, no distention.  Skin: Intact without lesions or rashes.  Neurologic: Alert and oriented x 3.  Psych: Normal affect. Extremities: No clubbing or cyanosis.  HEENT: Normal.   Assessment/Plan: 1. PE: Patient developed a PE with no obvious trigger. His father had a DVT but it seems to have happened after a leg injury. He is a Factor V Leiden heterozygote. - He will continue apixaban 5 mg bid.  Given spontaneous DVT, he likely will need to continue this long-term.  We can decrease apixaban to 2.5 mg bid after 6 months (6/16).   2. Chronic systolic CHF: Suspect nonischemic cardiomyopathy.  Heavy ETOH use may have contributed to cardiomyopathy.  However, he does have a strong family history of CMP (brother, mother and members of mother's family).  This may be a familial cardiomyopathy. ECHO 03/22/14 EF 20-25%.  Cardiac MRI in 1/16 showed LV EF 34%, RV EF 30%.  There was subtle mid-wall anterior LGE.  This did not appear to be a CAD pattern. NYHA class I-II symptoms currently. Weight continues to fall.  - He will continue to use Lasix only prn.   - Continue current Coreg, spironolactone, and digoxin.   - Stop lisinopril, start Entresto 24/26 bid. BMET/digoxin level in 10 days.  - Cardiac MRI not suggestive of coronary disease, possible subtle mid-wall LGE.  Repeat echo in 4/16.  If EF is not considerably improved, will need to get cardiac cath at that time. - Son and daughter should have echoes given concern for familial cardiomyopathy.  3. Ureteral dilation: Noted on CT while in hospital.  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  4. Diabetes Mellitus: Working on this with PCP.  5. ETOH: former heavy drinker. He continues to abstain from ETOH.    [name]  06/29/2014

## 2014-06-30 ENCOUNTER — Encounter (HOSPITAL_COMMUNITY): Payer: BLUE CROSS/BLUE SHIELD

## 2014-06-30 ENCOUNTER — Encounter: Payer: Self-pay | Admitting: Family Medicine

## 2014-06-30 ENCOUNTER — Ambulatory Visit (INDEPENDENT_AMBULATORY_CARE_PROVIDER_SITE_OTHER): Payer: BLUE CROSS/BLUE SHIELD | Admitting: Family Medicine

## 2014-06-30 VITALS — BP 116/70 | HR 68 | Temp 98.1°F | Resp 16 | Wt 158.5 lb

## 2014-06-30 DIAGNOSIS — E119 Type 2 diabetes mellitus without complications: Secondary | ICD-10-CM | POA: Diagnosis not present

## 2014-06-30 NOTE — Patient Instructions (Signed)
Follow up in 3-4 months to recheck BP and cholesterol We'll notify you of your lab results and make any changes if needed STOP the metformin Keep up the good work!  You look great! Call with an questions or concerns Happy Spring!!!

## 2014-06-30 NOTE — Assessment & Plan Note (Signed)
Fairly new problem for pt.  Pt has had excellent CBGs as documented by cardiac rehab.  UTD on eye exam.  Applauded his efforts at healthy diet, regular exercise.  Will hold Metformin until labs are available.  Pt expressed understanding and is in agreement w/ plan.

## 2014-06-30 NOTE — Progress Notes (Signed)
Pre visit review using our clinic review tool, if applicable. No additional management support is needed unless otherwise documented below in the visit note. 

## 2014-06-30 NOTE — Progress Notes (Signed)
   Subjective:    Patient ID: Matthew Green, male    DOB: 1965-05-07, 49 y.o.   MRN: [id number]  HPI DM- fairly new dx for pt.  Has been taking Metformin w/o difficulty until the switch to [facility].  Pt has lost another 12 lbs since last visit.  Exercising daily.  Pt's CBG is dropping while at cardiac rehab.  No CP, SOB, HAs, visual changes, edema.  No numbness/tingling of hands/feet.  UTD on eye exam.  On ARB for renal protection.   Review of Systems For ROS see HPI     Objective:   Physical Exam  Constitutional: He is oriented to person, place, and time. He appears well-developed and well-nourished. No distress.  HENT:  Head: Normocephalic and atraumatic.  Eyes: Conjunctivae and EOM are normal. Pupils are equal, round, and reactive to light.  Neck: Normal range of motion. Neck supple. No thyromegaly present.  Cardiovascular: Normal rate, regular rhythm, normal heart sounds and intact distal pulses.   No murmur heard. Pulmonary/Chest: Effort normal and breath sounds normal. No respiratory distress.  Abdominal: Soft. Bowel sounds are normal. He exhibits no distension.  Musculoskeletal: He exhibits no edema.  Lymphadenopathy:    He has no cervical adenopathy.  Neurological: He is alert and oriented to person, place, and time. No cranial nerve deficit.  Skin: Skin is warm and dry.  Psychiatric: He has a normal mood and affect. His behavior is normal.  Vitals reviewed.         Assessment & Plan:

## 2014-07-01 ENCOUNTER — Encounter: Payer: Self-pay | Admitting: Family Medicine

## 2014-07-01 LAB — BASIC METABOLIC PANEL
BUN: 13 mg/dL (ref 6–23)
CALCIUM: 9.7 mg/dL (ref 8.4–10.5)
CO2: 27 mEq/L (ref 19–32)
Chloride: 104 mEq/L (ref 96–112)
Creatinine, Ser: 0.85 mg/dL (ref 0.40–1.50)
GFR: 101.09 mL/min (ref >60.00)
GLUCOSE: 135 mg/dL — AB (ref 70–99)
Potassium: 4 mEq/L (ref 3.5–5.1)
SODIUM: 137 meq/L (ref 135–145)

## 2014-07-01 LAB — HEMOGLOBIN A1C: Hgb A1c MFr Bld: 5.6 % (ref 4.6–6.5)

## 2014-07-02 ENCOUNTER — Encounter (HOSPITAL_COMMUNITY)
Admission: RE | Admit: 2014-07-02 | Discharge: 2014-07-02 | Disposition: A | Discharge status: Home / Self Care | Payer: BLUE CROSS/BLUE SHIELD | Source: Ambulatory Visit | Attending: Cardiology | Admitting: Cardiology

## 2014-07-02 DIAGNOSIS — Z5189 Encounter for other specified aftercare: Secondary | ICD-10-CM | POA: Diagnosis not present

## 2014-07-02 LAB — GLUCOSE, CAPILLARY: GLUCOSE-CAPILLARY: 129 mg/dL — AB (ref 70–99)

## 2014-07-04 ENCOUNTER — Encounter (HOSPITAL_COMMUNITY)
Admission: RE | Admit: 2014-07-04 | Discharge: 2014-07-04 | Disposition: A | Discharge status: Home / Self Care | Payer: BLUE CROSS/BLUE SHIELD | Source: Ambulatory Visit | Attending: Cardiology | Admitting: Cardiology

## 2014-07-04 DIAGNOSIS — Z5189 Encounter for other specified aftercare: Secondary | ICD-10-CM | POA: Diagnosis not present

## 2014-07-07 ENCOUNTER — Encounter (HOSPITAL_COMMUNITY)
Admission: RE | Admit: 2014-07-07 | Discharge: 2014-07-07 | Disposition: A | Discharge status: Home / Self Care | Payer: BLUE CROSS/BLUE SHIELD | Source: Ambulatory Visit | Attending: Cardiology | Admitting: Cardiology

## 2014-07-07 DIAGNOSIS — Z5189 Encounter for other specified aftercare: Secondary | ICD-10-CM | POA: Diagnosis not present

## 2014-07-07 LAB — GLUCOSE, CAPILLARY: GLUCOSE-CAPILLARY: 114 mg/dL — AB (ref 70–99)

## 2014-07-09 ENCOUNTER — Encounter (HOSPITAL_COMMUNITY)
Admission: RE | Admit: 2014-07-09 | Discharge: 2014-07-09 | Disposition: A | Discharge status: Home / Self Care | Payer: BLUE CROSS/BLUE SHIELD | Source: Ambulatory Visit | Attending: Cardiology | Admitting: Cardiology

## 2014-07-09 ENCOUNTER — Ambulatory Visit (HOSPITAL_COMMUNITY)
Admission: RE | Admit: 2014-07-09 | Discharge: 2014-07-09 | Disposition: A | Discharge status: Home / Self Care | Payer: BLUE CROSS/BLUE SHIELD | Source: Ambulatory Visit | Attending: Internal Medicine | Admitting: Internal Medicine

## 2014-07-09 DIAGNOSIS — I5022 Chronic systolic (congestive) heart failure: Secondary | ICD-10-CM

## 2014-07-09 DIAGNOSIS — Z5189 Encounter for other specified aftercare: Secondary | ICD-10-CM | POA: Diagnosis not present

## 2014-07-09 LAB — BASIC METABOLIC PANEL
Anion gap: 8 (ref 5–15)
BUN: 17 mg/dL (ref 6–23)
CO2: 28 mmol/L (ref 19–32)
CREATININE: 0.92 mg/dL (ref 0.50–1.35)
Calcium: 9.3 mg/dL (ref 8.4–10.5)
Chloride: 102 mmol/L (ref 96–112)
GFR calc Af Amer: >90 mL/min (ref >90)
GFR calc non Af Amer: >90 mL/min (ref >90)
Glucose, Bld: 142 mg/dL — ABNORMAL HIGH (ref 70–99)
Potassium: 4.2 mmol/L (ref 3.5–5.1)
Sodium: 138 mmol/L (ref 135–145)

## 2014-07-09 LAB — DIGOXIN LEVEL: DIGOXIN LVL: 0.3 ng/mL — AB (ref 0.8–2.0)

## 2014-07-11 ENCOUNTER — Telehealth (HOSPITAL_COMMUNITY): Payer: Self-pay | Admitting: Vascular Surgery

## 2014-07-11 ENCOUNTER — Encounter (HOSPITAL_COMMUNITY)
Admission: RE | Admit: 2014-07-11 | Discharge: 2014-07-11 | Disposition: A | Discharge status: Home / Self Care | Payer: BLUE CROSS/BLUE SHIELD | Source: Ambulatory Visit | Attending: Cardiology | Admitting: Cardiology

## 2014-07-11 DIAGNOSIS — Z5189 Encounter for other specified aftercare: Secondary | ICD-10-CM | POA: Diagnosis not present

## 2014-07-11 NOTE — Telephone Encounter (Signed)
PA is still pending for entresto. Left samples at the front desk for pt.  Tried to call pt but vm full.

## 2014-07-11 NOTE — Telephone Encounter (Signed)
Pt called he will be out of his Entrusto tomorrow, he is calling to check on auth and if medication is not Matthew Green he wants to know if he could have more samples , he he will be here this afternoon for cardiac rehab.. please advise

## 2014-07-13 ENCOUNTER — Other Ambulatory Visit (HOSPITAL_COMMUNITY): Payer: Self-pay | Admitting: Cardiology

## 2014-07-14 ENCOUNTER — Encounter (HOSPITAL_COMMUNITY)
Admission: RE | Admit: 2014-07-14 | Discharge: 2014-07-14 | Disposition: A | Discharge status: Home / Self Care | Payer: BLUE CROSS/BLUE SHIELD | Source: Ambulatory Visit | Attending: Cardiology | Admitting: Cardiology

## 2014-07-14 DIAGNOSIS — Z5189 Encounter for other specified aftercare: Secondary | ICD-10-CM | POA: Diagnosis not present

## 2014-07-15 ENCOUNTER — Telehealth (HOSPITAL_COMMUNITY): Payer: Self-pay | Admitting: *Deleted

## 2014-07-15 ENCOUNTER — Other Ambulatory Visit (HOSPITAL_COMMUNITY): Payer: Self-pay | Admitting: *Deleted

## 2014-07-15 ENCOUNTER — Ambulatory Visit (HOSPITAL_COMMUNITY)
Admission: RE | Admit: 2014-07-15 | Discharge: 2014-07-15 | Disposition: A | Discharge status: Home / Self Care | Payer: BLUE CROSS/BLUE SHIELD | Source: Ambulatory Visit | Attending: Cardiology | Admitting: Cardiology

## 2014-07-15 DIAGNOSIS — I50810 Right heart failure, unspecified: Secondary | ICD-10-CM

## 2014-07-15 DIAGNOSIS — I5022 Chronic systolic (congestive) heart failure: Secondary | ICD-10-CM | POA: Insufficient documentation

## 2014-07-15 DIAGNOSIS — I509 Heart failure, unspecified: Secondary | ICD-10-CM | POA: Diagnosis not present

## 2014-07-15 NOTE — Progress Notes (Signed)
Echocardiogram 2D Echocardiogram has been performed.  [name] 07/15/2014, 3:56 PM

## 2014-07-15 NOTE — Telephone Encounter (Signed)
Completed PA for pt's Matthew Green, med was approved 07/11/14-03/27/38, [facility] aware

## 2014-07-16 ENCOUNTER — Encounter (HOSPITAL_COMMUNITY)
Admission: RE | Admit: 2014-07-16 | Discharge: 2014-07-16 | Disposition: A | Discharge status: Home / Self Care | Payer: BLUE CROSS/BLUE SHIELD | Source: Ambulatory Visit | Attending: Cardiology | Admitting: Cardiology

## 2014-07-16 DIAGNOSIS — Z5189 Encounter for other specified aftercare: Secondary | ICD-10-CM | POA: Diagnosis not present

## 2014-07-18 ENCOUNTER — Encounter (HOSPITAL_COMMUNITY)
Admission: RE | Admit: 2014-07-18 | Discharge: 2014-07-18 | Disposition: A | Discharge status: Home / Self Care | Payer: BLUE CROSS/BLUE SHIELD | Source: Ambulatory Visit | Attending: Cardiology | Admitting: Cardiology

## 2014-07-18 DIAGNOSIS — Z5189 Encounter for other specified aftercare: Secondary | ICD-10-CM | POA: Diagnosis not present

## 2014-07-21 ENCOUNTER — Encounter (HOSPITAL_COMMUNITY)
Admission: RE | Admit: 2014-07-21 | Discharge: 2014-07-21 | Disposition: A | Discharge status: Home / Self Care | Payer: BLUE CROSS/BLUE SHIELD | Source: Ambulatory Visit | Attending: Cardiology | Admitting: Cardiology

## 2014-07-21 DIAGNOSIS — Z5189 Encounter for other specified aftercare: Secondary | ICD-10-CM | POA: Diagnosis not present

## 2014-07-23 ENCOUNTER — Telehealth (HOSPITAL_COMMUNITY): Payer: Self-pay | Admitting: Family Medicine

## 2014-07-23 ENCOUNTER — Encounter (HOSPITAL_COMMUNITY): Payer: BLUE CROSS/BLUE SHIELD

## 2014-07-25 ENCOUNTER — Encounter (HOSPITAL_COMMUNITY): Payer: BLUE CROSS/BLUE SHIELD

## 2014-07-28 ENCOUNTER — Encounter (HOSPITAL_COMMUNITY): Payer: BLUE CROSS/BLUE SHIELD

## 2014-07-30 ENCOUNTER — Encounter (HOSPITAL_COMMUNITY): Payer: BLUE CROSS/BLUE SHIELD

## 2014-07-30 ENCOUNTER — Ambulatory Visit (HOSPITAL_COMMUNITY)
Admission: RE | Admit: 2014-07-30 | Discharge: 2014-07-30 | Disposition: A | Discharge status: Home / Self Care | Payer: BLUE CROSS/BLUE SHIELD | Source: Ambulatory Visit | Attending: Cardiology | Admitting: Cardiology

## 2014-07-30 ENCOUNTER — Encounter (HOSPITAL_COMMUNITY): Payer: Self-pay

## 2014-07-30 VITALS — BP 108/60 | HR 73 | Wt 161.4 lb

## 2014-07-30 DIAGNOSIS — I5022 Chronic systolic (congestive) heart failure: Secondary | ICD-10-CM | POA: Diagnosis not present

## 2014-07-30 DIAGNOSIS — I2699 Other pulmonary embolism without acute cor pulmonale: Secondary | ICD-10-CM

## 2014-07-30 DIAGNOSIS — F1010 Alcohol abuse, uncomplicated: Secondary | ICD-10-CM

## 2014-07-30 LAB — BASIC METABOLIC PANEL
Anion gap: 8 (ref 5–15)
BUN: 11 mg/dL (ref 6–20)
CALCIUM: 9.4 mg/dL (ref 8.9–10.3)
CO2: 28 mmol/L (ref 22–32)
CREATININE: 0.98 mg/dL (ref 0.61–1.24)
Chloride: 101 mmol/L (ref 101–111)
GFR calc Af Amer: >60 mL/min (ref >60)
GFR calc non Af Amer: >60 mL/min (ref >60)
GLUCOSE: 145 mg/dL — AB (ref 70–99)
Potassium: 4.6 mmol/L (ref 3.5–5.1)
Sodium: 137 mmol/L (ref 135–145)

## 2014-07-30 MED ORDER — APIXABAN 5 MG PO TABS: 5.0000 mg | ORAL_TABLET | Freq: Two times a day (BID) | ORAL | Status: DC

## 2014-07-30 NOTE — Progress Notes (Signed)
Patient ID: Matthew Green, male   DOB: 24-Jun-1965, 49 y.o.   MRN: [id number] PCP: Dr. Birdie Riddle  49 yo with history of tobacco use and heavy ETOH use developed exertional dyspnea and orthopnea about 3 weeks prior to recent admission.  No chest pain.  He was admitted 03/21/14 and found to have a PE.  Lower extremity US showed no DVT.  He had no known trigger: no surgery or prolonged immobility.  His father had a DVT after a leg injury.  As part of the workup, he had an echo showing EF 20-25% with diffuse hypokinesis.  He was noted to be volume overloaded and was diuresed.  He was started on apixaban.  His brother and multiple family members on his mother's side have a history of cardiomyopathy.  Of note, he has been a heavy drinker, up to 12 beers/day, since his divorce.  He was also a heavy smoker until around the time of his CHF admission, when he quit.  Echo was repeated in 4/16, showing EF 50-55% with trace MR.    He returns for follow up.  He is working full time with no problems.  Overall feeling much better. Denies SOB/PND/orthopnea. Able to walk up steps without difficulty. He jogs for about a quarter mile at a time without difficulty.  He went sea-kayaking recently.  No melena or BRBPR.  No smoking, rare ETOH.  He has not used any Lasix. No morning lightheadedness with Delene Loll like he had with lisinopril.   Labs  (03/25/14): K 4.6, creatinine 1.29, HCT 43.5, BNP 987, TnI 0.05, AST/ALT normal, LDL 64, TSH normal, lupus anticoagulant negative, beta-2 glycoprotein negative, anticardiolipin antibody negative. Factor V Leiden homozygote.  Negative for prothrombin gene mutation.  (03/27/14): Dig level 0.7  (1/16): K 4.2, creatinine 0.9, LFTs normal (3/16): K 3.7, creatinine 1.02, BNP 82 (4/16): K 4.2, creatinine 0.92, digoxin 0.3  PMH: 1. PE: Diagnosed by CTA in 12/15. Lower extremity US was negative.  There was no trigger.  Lupus anticoagulant negative, beta-2 glycoprotein negative, anticardiolipin  antibody negative, prothrombin gene mutation negative.  Factor V Leiden heterozygote.  2. Cardiomyopathy: Echo (12/15) with EF 20-25%, mildly dilated LV with diffuse hypokinesis, RV mildly dilated with moderately decreased systolic function, PA systolic pressure 44 mmHg. Cardiac MRI (1/16) with LV EF 34%, global hypokinesis; RV moderately hypokinetic, EF 30%, possible subtle mid-wall LGE in the anterior wall (not a coronary pattern).  Echo (4/16) with EF 50-55%, trace MR.  3. 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 .   SH: Lives in [place], works as Teacher, adult education, divorced.  Drank 12 beers/day, now rare ETOH.  Smokes 1/2-1 ppd, has now quit.   FH: Father with DVT after leg injury.  Mother, aunts, and brother with cardiomyopathy/CHF.   ROS: All systems reviewed and negative except as per HPI.   Current Outpatient Prescriptions  Medication Sig Dispense Refill  . apixaban (ELIQUIS) 5 MG TABS tablet Take 1 tablet (5 mg total) by mouth 2 (two) times daily. Starting in June, may decrease to 2.5 mg BID 60 tablet 2  . carvedilol (COREG) 12.5 MG tablet Take 1 tablet (12.5 mg total) by mouth 2 (two) times daily with a meal. 60 tablet 3  . furosemide (LASIX) 40 MG tablet Take 1 tablet (40 mg total) by mouth daily as needed (For weight 175 lbs or more). 30 tablet 0  . glucose blood test strip 1 each by Other route as  needed for other. Test sugars once daily. E11.9 50 each 6  . ONETOUCH DELICA LANCETS FINE MISC Test sugars once daily. E11.9 100 each 6  . sacubitril-valsartan (ENTRESTO) 24-26 MG Take 1 tablet by mouth 2 (two) times daily. 60 tablet 3  . spironolactone (ALDACTONE) 25 MG tablet Take 1 tablet (25 mg total) by mouth daily. 30 tablet 3   No current facility-administered medications for this encounter.   BP 108/60 mmHg  Pulse 73  Wt 161 lb 6.4 oz (73.211 kg)  SpO2 100% General: NAD Neck: JVP flat, no thyromegaly or thyroid nodule.   Lungs: Clear to auscultation bilaterally with normal respiratory effort. CV: Nondisplaced PMI.  Heart regular S1/S2, no S3/S4, no murmur.  No edema.  No carotid bruit.  Normal pedal pulses.  Abdomen: Soft, nontender, no hepatosplenomegaly, no distention.  Skin: Intact without lesions or rashes.  Neurologic: Alert and oriented x 3.  Psych: Normal affect. Extremities: No clubbing or cyanosis.  HEENT: Normal.   Assessment/Plan: 1. PE: Patient developed a PE with no obvious trigger. His father had a DVT but it seems to have happened after a leg injury. He is a Factor V Leiden heterozygote. - He will continue apixaban 5 mg bid.  Given spontaneous DVT, he likely will need to continue this long-term.  We can decrease apixaban to 2.5 mg bid after 6 months (6/16).   2. Chronic systolic CHF: Suspect nonischemic cardiomyopathy.  Heavy ETOH use may have contributed to cardiomyopathy.  However, he does have a strong family history of CMP (brother, mother and members of mother's family).  This may be a familial cardiomyopathy. ECHO 03/22/14 EF 20-25%.  Cardiac MRI in 1/16 showed LV EF 34%, RV EF 30%.  There was subtle mid-wall anterior LGE.  This did not appear to be a CAD pattern. NYHA class I symptoms currently. Repeat echo in 4/16 showed EF improved considerably to 50-55%, suggesting that heavy ETOH may have played a large role with improvement after cutting back.  - He will continue to use Lasix only prn.   - Continue current Coreg, spironolactone, and Entresto.  BMET today.  - He can stop digoxin. - I think we can hold off on cardiac cath or stress test at this point.  - I sill think that his son and daughter should have echoes given concern for familial cardiomyopathy.  3. Ureteral dilation: Noted on CT while in hospital.  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  4. Diabetes Mellitus: Working on this with PCP.  5. ETOH: former heavy drinker. Drinking very little now.   Followup in 3 months   [name]  07/30/2014

## 2014-07-30 NOTE — Patient Instructions (Signed)
STOP Digoxin STARTING IN June, you may decrease Eliquis to 2.5 mg, one tab twice a day  [facility] [place] physician recommends that you schedule a follow-up appointment in: 3 months  Do the following things EVERYDAY: 1) Weigh yourself in the morning before breakfast. Write it down and keep it in a log. 2) Take your medicines as prescribed 3) Eat low salt foods-Limit salt (sodium) to 2000 mg per day.  4) Stay as active as you can everyday 5) Limit all fluids for the day to less than 2 liters 6)

## 2014-07-31 ENCOUNTER — Telehealth (HOSPITAL_COMMUNITY): Payer: Self-pay | Admitting: *Deleted

## 2014-08-01 ENCOUNTER — Encounter (HOSPITAL_COMMUNITY): Payer: BLUE CROSS/BLUE SHIELD

## 2014-08-04 ENCOUNTER — Encounter (HOSPITAL_COMMUNITY): Payer: BLUE CROSS/BLUE SHIELD

## 2014-08-04 ENCOUNTER — Other Ambulatory Visit (HOSPITAL_COMMUNITY): Payer: Self-pay | Admitting: Cardiology

## 2014-08-05 ENCOUNTER — Telehealth (HOSPITAL_COMMUNITY): Payer: Self-pay | Admitting: *Deleted

## 2014-08-06 ENCOUNTER — Encounter (HOSPITAL_COMMUNITY): Payer: BLUE CROSS/BLUE SHIELD

## 2014-08-08 ENCOUNTER — Encounter (HOSPITAL_COMMUNITY): Payer: BLUE CROSS/BLUE SHIELD

## 2014-08-11 ENCOUNTER — Encounter (HOSPITAL_COMMUNITY): Payer: BLUE CROSS/BLUE SHIELD

## 2014-08-13 ENCOUNTER — Encounter (HOSPITAL_COMMUNITY): Payer: BLUE CROSS/BLUE SHIELD

## 2014-08-15 ENCOUNTER — Encounter (HOSPITAL_COMMUNITY): Payer: BLUE CROSS/BLUE SHIELD

## 2014-08-18 ENCOUNTER — Encounter (HOSPITAL_COMMUNITY): Payer: BLUE CROSS/BLUE SHIELD

## 2014-08-20 ENCOUNTER — Encounter (HOSPITAL_COMMUNITY): Payer: BLUE CROSS/BLUE SHIELD

## 2014-08-22 ENCOUNTER — Encounter (HOSPITAL_COMMUNITY): Payer: BLUE CROSS/BLUE SHIELD

## 2014-08-27 ENCOUNTER — Encounter (HOSPITAL_COMMUNITY): Payer: BLUE CROSS/BLUE SHIELD

## 2014-08-29 ENCOUNTER — Encounter (HOSPITAL_COMMUNITY): Payer: BLUE CROSS/BLUE SHIELD

## 2014-09-01 ENCOUNTER — Encounter (HOSPITAL_COMMUNITY): Payer: BLUE CROSS/BLUE SHIELD

## 2014-09-03 ENCOUNTER — Encounter (HOSPITAL_COMMUNITY): Payer: BLUE CROSS/BLUE SHIELD

## 2014-09-05 ENCOUNTER — Encounter (HOSPITAL_COMMUNITY): Payer: BLUE CROSS/BLUE SHIELD

## 2014-09-07 ENCOUNTER — Other Ambulatory Visit (HOSPITAL_COMMUNITY): Payer: Self-pay | Admitting: Cardiology

## 2014-09-22 ENCOUNTER — Other Ambulatory Visit: Payer: Self-pay

## 2014-09-24 ENCOUNTER — Other Ambulatory Visit (HOSPITAL_COMMUNITY): Payer: Self-pay | Admitting: Cardiology

## 2014-10-14 ENCOUNTER — Other Ambulatory Visit (HOSPITAL_COMMUNITY): Payer: Self-pay | Admitting: *Deleted

## 2014-10-14 DIAGNOSIS — I5022 Chronic systolic (congestive) heart failure: Secondary | ICD-10-CM

## 2014-10-14 MED ORDER — APIXABAN 2.5 MG PO TABS: 2.5000 mg | ORAL_TABLET | Freq: Two times a day (BID) | ORAL | Status: DC

## 2014-10-30 ENCOUNTER — Encounter: Payer: Self-pay | Admitting: Family Medicine

## 2014-10-30 ENCOUNTER — Ambulatory Visit (INDEPENDENT_AMBULATORY_CARE_PROVIDER_SITE_OTHER): Payer: BLUE CROSS/BLUE SHIELD | Admitting: Family Medicine

## 2014-10-30 VITALS — BP 122/84 | HR 74 | Temp 98.0°F | Resp 16 | Ht 68.0 in | Wt 167.4 lb

## 2014-10-30 DIAGNOSIS — E119 Type 2 diabetes mellitus without complications: Secondary | ICD-10-CM | POA: Diagnosis not present

## 2014-10-30 DIAGNOSIS — I10 Essential (primary) hypertension: Secondary | ICD-10-CM

## 2014-10-30 NOTE — Progress Notes (Signed)
Pre visit review using our clinic review tool, if applicable. No additional management support is needed unless otherwise documented below in the visit note. 

## 2014-10-30 NOTE — Progress Notes (Signed)
   Subjective:    Patient ID: Matthew Green, male    DOB: Aug 15, 1965, 49 y.o.   MRN: [id number]  HPI DM- ongoing issue for pt.  Off Metformin.  Now controlling w/ diet and exercise.  On ARB for renal protection.  UTD on eye exam and foot exam.  Not checking sugars regularly.  When he does spot check, sugars are not over 80.  Exercising regularly.  HTN- chronic problem for pt.  Excellent control on Coreg, Entresto, Aldactone, Lasix prn.  No CP, SOB, HAs, visual changes, edema.   Review of Systems For ROS see HPI     Objective:   Physical Exam  Constitutional: He is oriented to person, place, and time. He appears well-developed and well-nourished. No distress.  HENT:  Head: Normocephalic and atraumatic.  Eyes: Conjunctivae and EOM are normal. Pupils are equal, round, and reactive to light.  Neck: Normal range of motion. Neck supple. No thyromegaly present.  Cardiovascular: Normal rate, regular rhythm, normal heart sounds and intact distal pulses.   No murmur heard. Pulmonary/Chest: Effort normal and breath sounds normal. No respiratory distress.  Abdominal: Soft. Bowel sounds are normal. He exhibits no distension.  Musculoskeletal: He exhibits no edema.  Lymphadenopathy:    He has no cervical adenopathy.  Neurological: He is alert and oriented to person, place, and time. No cranial nerve deficit.  Skin: Skin is warm and dry.  Psychiatric: He has a normal mood and affect. His behavior is normal.  Vitals reviewed.         Assessment & Plan:

## 2014-10-30 NOTE — Assessment & Plan Note (Signed)
Ongoing issue for pt.  Diet controlled.  Off all meds at this time.  Exercising daily.  UTD on eye exam, foot exam.  On ARB for renal protection.  Check labs.  Adjust tx plan prn.

## 2014-10-30 NOTE — Assessment & Plan Note (Signed)
Chronic problem.  BP is well controlled, pt is asymptomatic.  Check labs.  No anticipated med changes.

## 2014-10-30 NOTE — Patient Instructions (Signed)
Schedule your complete physical in 6 months We'll notify you of your lab results and make any changes if needed Keep up the good work on healthy diet and regular exercise- you look great!! Call with any questions or concerns Enjoy the rest of your summer!!! 

## 2014-10-31 ENCOUNTER — Encounter: Payer: Self-pay | Admitting: Family Medicine

## 2014-10-31 LAB — LIPID PANEL
CHOLESTEROL: 164 mg/dL (ref 0–200)
HDL: 37.6 mg/dL — ABNORMAL LOW (ref >39.00)
LDL Cholesterol: 94 mg/dL (ref 0–99)
NonHDL: 126.32
Total CHOL/HDL Ratio: 4
Triglycerides: 160 mg/dL — ABNORMAL HIGH (ref 0.0–149.0)
VLDL: 32 mg/dL (ref 0.0–40.0)

## 2014-10-31 LAB — CBC WITH DIFFERENTIAL/PLATELET
Basophils Absolute: 0 10*3/uL (ref 0.0–0.1)
Basophils Relative: 0.2 % (ref 0.0–3.0)
Eosinophils Absolute: 0.2 10*3/uL (ref 0.0–0.7)
Eosinophils Relative: 3.8 % (ref 0.0–5.0)
HCT: 40.5 % (ref 39.0–52.0)
Hemoglobin: 13.9 g/dL (ref 13.0–17.0)
LYMPHS ABS: 2.7 10*3/uL (ref 0.7–4.0)
LYMPHS PCT: 41.5 % (ref 12.0–46.0)
MCHC: 34.4 g/dL (ref 30.0–36.0)
MCV: 93.7 fl (ref 78.0–100.0)
MONOS PCT: 5.4 % (ref 3.0–12.0)
Monocytes Absolute: 0.3 10*3/uL (ref 0.1–1.0)
NEUTROS ABS: 3.2 10*3/uL (ref 1.4–7.7)
NEUTROS PCT: 49.1 % (ref 43.0–77.0)
PLATELETS: 193 10*3/uL (ref 150.0–400.0)
RBC: 4.32 Mil/uL (ref 4.22–5.81)
RDW: 13.2 % (ref 11.5–15.5)
WBC: 6.4 10*3/uL (ref 4.0–10.5)

## 2014-10-31 LAB — HEPATIC FUNCTION PANEL
ALK PHOS: 56 U/L (ref 39–117)
ALT: 12 U/L (ref 0–53)
AST: 11 U/L (ref 0–37)
Albumin: 4.1 g/dL (ref 3.5–5.2)
BILIRUBIN DIRECT: 0.1 mg/dL (ref 0.0–0.3)
Total Bilirubin: 0.4 mg/dL (ref 0.2–1.2)
Total Protein: 6.8 g/dL (ref 6.0–8.3)

## 2014-10-31 LAB — BASIC METABOLIC PANEL
BUN: 16 mg/dL (ref 6–23)
CHLORIDE: 101 meq/L (ref 96–112)
CO2: 30 mEq/L (ref 19–32)
Calcium: 9.5 mg/dL (ref 8.4–10.5)
Creatinine, Ser: 0.88 mg/dL (ref 0.40–1.50)
GFR: 97 mL/min (ref >60.00)
GLUCOSE: 102 mg/dL — AB (ref 70–99)
Potassium: 4 mEq/L (ref 3.5–5.1)
Sodium: 136 mEq/L (ref 135–145)

## 2014-10-31 LAB — HEMOGLOBIN A1C: Hgb A1c MFr Bld: 5.7 % (ref 4.6–6.5)

## 2014-11-21 ENCOUNTER — Other Ambulatory Visit: Payer: Self-pay | Admitting: Cardiology

## 2014-11-21 ENCOUNTER — Other Ambulatory Visit (HOSPITAL_COMMUNITY): Payer: Self-pay | Admitting: *Deleted

## 2014-11-21 MED ORDER — SACUBITRIL-VALSARTAN 24-26 MG PO TABS: 1.0000 | ORAL_TABLET | Freq: Two times a day (BID) | ORAL | Status: DC

## 2014-11-26 ENCOUNTER — Encounter: Payer: Self-pay | Admitting: Family Medicine

## 2014-12-22 ENCOUNTER — Other Ambulatory Visit (HOSPITAL_COMMUNITY): Payer: Self-pay | Admitting: Cardiology

## 2015-01-09 ENCOUNTER — Encounter: Payer: Self-pay | Admitting: Family Medicine

## 2015-01-19 ENCOUNTER — Other Ambulatory Visit (HOSPITAL_COMMUNITY): Payer: Self-pay | Admitting: Cardiology

## 2015-02-17 ENCOUNTER — Other Ambulatory Visit (HOSPITAL_COMMUNITY): Payer: Self-pay | Admitting: Cardiology

## 2015-03-25 ENCOUNTER — Other Ambulatory Visit (HOSPITAL_COMMUNITY): Payer: Self-pay | Admitting: *Deleted

## 2015-03-25 MED ORDER — SACUBITRIL-VALSARTAN 24-26 MG PO TABS: 1.0000 | ORAL_TABLET | Freq: Two times a day (BID) | ORAL | Status: DC

## 2015-04-10 ENCOUNTER — Encounter: Payer: Self-pay | Admitting: Family Medicine

## 2015-04-10 DIAGNOSIS — L509 Urticaria, unspecified: Secondary | ICD-10-CM

## 2015-04-10 NOTE — Telephone Encounter (Signed)
Dr. Darene Lamer, please advise.

## 2015-04-13 ENCOUNTER — Encounter: Payer: Self-pay | Admitting: Family Medicine

## 2015-04-16 ENCOUNTER — Other Ambulatory Visit (HOSPITAL_COMMUNITY): Payer: Self-pay | Admitting: *Deleted

## 2015-04-16 DIAGNOSIS — I5022 Chronic systolic (congestive) heart failure: Secondary | ICD-10-CM

## 2015-04-16 MED ORDER — APIXABAN 2.5 MG PO TABS: 2.5000 mg | ORAL_TABLET | Freq: Two times a day (BID) | ORAL | Status: DC

## 2015-05-01 ENCOUNTER — Encounter: Payer: Self-pay | Admitting: Family Medicine

## 2015-05-01 ENCOUNTER — Ambulatory Visit (INDEPENDENT_AMBULATORY_CARE_PROVIDER_SITE_OTHER): Payer: BLUE CROSS/BLUE SHIELD | Admitting: Family Medicine

## 2015-05-01 VITALS — BP 140/80 | HR 83 | Temp 98.0°F | Ht 68.0 in | Wt 180.0 lb

## 2015-05-01 DIAGNOSIS — E119 Type 2 diabetes mellitus without complications: Secondary | ICD-10-CM | POA: Diagnosis not present

## 2015-05-01 DIAGNOSIS — R7989 Other specified abnormal findings of blood chemistry: Secondary | ICD-10-CM | POA: Diagnosis not present

## 2015-05-01 DIAGNOSIS — Z0000 Encounter for general adult medical examination without abnormal findings: Secondary | ICD-10-CM | POA: Diagnosis not present

## 2015-05-01 LAB — BASIC METABOLIC PANEL
BUN: 19 mg/dL (ref 6–23)
CALCIUM: 9.6 mg/dL (ref 8.4–10.5)
CO2: 29 meq/L (ref 19–32)
CREATININE: 0.86 mg/dL (ref 0.40–1.50)
Chloride: 100 mEq/L (ref 96–112)
GFR: 99.41 mL/min (ref >60.00)
GLUCOSE: 137 mg/dL — AB (ref 70–99)
Potassium: 4.2 mEq/L (ref 3.5–5.1)
SODIUM: 137 meq/L (ref 135–145)

## 2015-05-01 LAB — PSA: PSA: 0.49 ng/mL (ref 0.10–4.00)

## 2015-05-01 LAB — CBC WITH DIFFERENTIAL/PLATELET
BASOS ABS: 0 10*3/uL (ref 0.0–0.1)
BASOS PCT: 0.5 % (ref 0.0–3.0)
EOS ABS: 0.3 10*3/uL (ref 0.0–0.7)
Eosinophils Relative: 4 % (ref 0.0–5.0)
HEMATOCRIT: 42.8 % (ref 39.0–52.0)
HEMOGLOBIN: 14.7 g/dL (ref 13.0–17.0)
Lymphocytes Relative: 36.4 % (ref 12.0–46.0)
Lymphs Abs: 2.7 10*3/uL (ref 0.7–4.0)
MCHC: 34.4 g/dL (ref 30.0–36.0)
MCV: 95.9 fl (ref 78.0–100.0)
Monocytes Absolute: 0.4 10*3/uL (ref 0.1–1.0)
Monocytes Relative: 5.5 % (ref 3.0–12.0)
NEUTROS ABS: 4 10*3/uL (ref 1.4–7.7)
Neutrophils Relative %: 53.6 % (ref 43.0–77.0)
Platelets: 193 10*3/uL (ref 150.0–400.0)
RBC: 4.46 Mil/uL (ref 4.22–5.81)
RDW: 12.5 % (ref 11.5–15.5)
WBC: 7.4 10*3/uL (ref 4.0–10.5)

## 2015-05-01 LAB — TSH: TSH: 0.71 u[IU]/mL (ref 0.35–4.50)

## 2015-05-01 LAB — LIPID PANEL
Cholesterol: 171 mg/dL (ref 0–200)
HDL: 36.8 mg/dL — ABNORMAL LOW (ref >39.00)
NonHDL: 134.18
TRIGLYCERIDES: 365 mg/dL — AB (ref 0.0–149.0)
Total CHOL/HDL Ratio: 5
VLDL: 73 mg/dL — AB (ref 0.0–40.0)

## 2015-05-01 LAB — HM DIABETES EYE EXAM

## 2015-05-01 LAB — HEPATIC FUNCTION PANEL
ALBUMIN: 4.4 g/dL (ref 3.5–5.2)
ALT: 20 U/L (ref 0–53)
AST: 16 U/L (ref 0–37)
Alkaline Phosphatase: 61 U/L (ref 39–117)
BILIRUBIN DIRECT: 0.1 mg/dL (ref 0.0–0.3)
TOTAL PROTEIN: 7.1 g/dL (ref 6.0–8.3)
Total Bilirubin: 0.7 mg/dL (ref 0.2–1.2)

## 2015-05-01 LAB — HEMOGLOBIN A1C: Hgb A1c MFr Bld: 5.9 % (ref 4.6–6.5)

## 2015-05-01 LAB — LDL CHOLESTEROL, DIRECT: LDL DIRECT: 90 mg/dL

## 2015-05-01 NOTE — Patient Instructions (Signed)
Follow up in 6 months to recheck BP, cholesterol, sugar We'll notify you of your lab results and make any changes if needed Keep up the good work on healthy diet and regular exercise- you look great! Complete the cologuard as directed Call with any questions or concerns If you want to join [place] at the new [place] office, any scheduled appointments will automatically transfer and we will see you at [address] [place] [address], [place], [place] [address] ([place]) Have a great weekend!!!

## 2015-05-01 NOTE — Progress Notes (Signed)
   Subjective:    Patient ID: Matthew Green, male    DOB: 05-10-1965, 50 y.o.   MRN: [id number]  HPI CPE- due for colonoscopy but pt is on Eliquis.  Will do cologuard instead.   Review of Systems Patient reports no vision/hearing changes, anorexia, fever ,adenopathy, persistant/recurrent hoarseness, swallowing issues, chest pain, palpitations, edema, persistant/recurrent cough, hemoptysis, dyspnea (rest,exertional, paroxysmal nocturnal), gastrointestinal  bleeding (melena, rectal bleeding), abdominal pain, excessive heart burn, GU symptoms (dysuria, hematuria, voiding/incontinence issues) syncope, focal weakness, memory loss, numbness & tingling, skin/hair/nail changes, depression, anxiety, abnormal bruising/bleeding, musculoskeletal symptoms/signs.     Objective:   Physical Exam General Appearance:    Alert, cooperative, no distress, appears stated age  Head:    Normocephalic, without obvious abnormality, atraumatic  Eyes:    PERRL, conjunctiva/corneas clear, EOM's intact, fundi    benign, both eyes       Ears:    Normal TM's and external ear canals, both ears  Nose:   Nares normal, septum midline, mucosa normal, no drainage   or sinus tenderness  Throat:   Lips, mucosa, and tongue normal; teeth and gums normal  Neck:   Supple, symmetrical, trachea midline, no adenopathy;       thyroid:  No enlargement/tenderness/nodules  Back:     Symmetric, no curvature, ROM normal, no CVA tenderness  Lungs:     Clear to auscultation bilaterally, respirations unlabored  Chest wall:    No tenderness or deformity  Heart:    Regular rate and rhythm, S1 and S2 normal, no murmur, rub   or gallop  Abdomen:     Soft, non-tender, bowel sounds active all four quadrants,    no masses, no organomegaly  Genitalia:    Normal male without lesion, masses,discharge or tenderness  Rectal:    Deferred  Extremities:   Extremities normal, atraumatic, no cyanosis or edema  Pulses:   2+ and symmetric all extremities    Skin:   Skin color, texture, turgor normal, no rashes or lesions  Lymph nodes:   Cervical, supraclavicular, and axillary nodes normal  Neurologic:   CNII-XII intact. Normal strength, sensation and reflexes      throughout          Assessment & Plan:

## 2015-05-01 NOTE — Progress Notes (Signed)
Pre visit review using our clinic review tool, if applicable. No additional management support is needed unless otherwise documented below in the visit note. 

## 2015-05-03 NOTE — Assessment & Plan Note (Signed)
Currently diet controlled.  On ARB for renal protection.  UTD on eye exam/foot exam.  Check labs.  Start meds prn.

## 2015-05-03 NOTE — Assessment & Plan Note (Signed)
Pt's PE WNL.  Due for colonoscopy but he is on Eliquis.  Will do Cologuard instead.  Check labs.  Anticipatory guidance provided.

## 2015-05-04 ENCOUNTER — Other Ambulatory Visit: Payer: Self-pay | Admitting: Family Medicine

## 2015-05-04 ENCOUNTER — Telehealth: Payer: Self-pay | Admitting: Family Medicine

## 2015-05-04 DIAGNOSIS — E781 Pure hyperglyceridemia: Secondary | ICD-10-CM

## 2015-05-04 NOTE — Telephone Encounter (Signed)
Tried to schedule pt lab appt at [facility] office. Schedule is not open. Please advise.

## 2015-05-14 ENCOUNTER — Other Ambulatory Visit (HOSPITAL_COMMUNITY): Payer: Self-pay | Admitting: Internal Medicine

## 2015-05-22 LAB — COLOGUARD: COLOGUARD: NEGATIVE

## 2015-06-05 ENCOUNTER — Encounter: Payer: Self-pay | Admitting: General Practice

## 2015-06-30 ENCOUNTER — Other Ambulatory Visit (HOSPITAL_COMMUNITY): Payer: Self-pay | Admitting: *Deleted

## 2015-06-30 MED ORDER — CARVEDILOL 12.5 MG PO TABS: ORAL_TABLET | ORAL | Status: DC

## 2015-07-13 ENCOUNTER — Encounter (HOSPITAL_COMMUNITY): Payer: Self-pay

## 2015-07-23 ENCOUNTER — Other Ambulatory Visit (HOSPITAL_COMMUNITY): Payer: Self-pay | Admitting: *Deleted

## 2015-07-23 MED ORDER — SACUBITRIL-VALSARTAN 24-26 MG PO TABS: 1.0000 | ORAL_TABLET | Freq: Two times a day (BID) | ORAL | Status: DC

## 2015-07-27 ENCOUNTER — Other Ambulatory Visit (HOSPITAL_COMMUNITY): Payer: Self-pay | Admitting: *Deleted

## 2015-08-09 ENCOUNTER — Other Ambulatory Visit (HOSPITAL_COMMUNITY): Payer: Self-pay | Admitting: Cardiology

## 2015-10-29 ENCOUNTER — Encounter: Payer: Self-pay | Admitting: Family Medicine

## 2015-10-29 ENCOUNTER — Ambulatory Visit (INDEPENDENT_AMBULATORY_CARE_PROVIDER_SITE_OTHER): Payer: BLUE CROSS/BLUE SHIELD | Admitting: Family Medicine

## 2015-10-29 VITALS — BP 128/82 | HR 82 | Temp 98.1°F | Resp 16 | Ht 68.0 in | Wt 184.5 lb

## 2015-10-29 DIAGNOSIS — E119 Type 2 diabetes mellitus without complications: Secondary | ICD-10-CM

## 2015-10-29 DIAGNOSIS — Z23 Encounter for immunization: Secondary | ICD-10-CM | POA: Diagnosis not present

## 2015-10-29 LAB — HEPATIC FUNCTION PANEL
ALT: 24 U/L (ref 0–53)
AST: 25 U/L (ref 0–37)
Albumin: 4.4 g/dL (ref 3.5–5.2)
Alkaline Phosphatase: 58 U/L (ref 39–117)
BILIRUBIN TOTAL: 0.6 mg/dL (ref 0.2–1.2)
Bilirubin, Direct: 0.1 mg/dL (ref 0.0–0.3)
Total Protein: 7.3 g/dL (ref 6.0–8.3)

## 2015-10-29 LAB — BASIC METABOLIC PANEL
BUN: 11 mg/dL (ref 6–23)
CALCIUM: 10.3 mg/dL (ref 8.4–10.5)
CO2: 30 meq/L (ref 19–32)
Chloride: 100 mEq/L (ref 96–112)
Creatinine, Ser: 0.87 mg/dL (ref 0.40–1.50)
GFR: 97.9 mL/min (ref >60.00)
GLUCOSE: 136 mg/dL — AB (ref 70–99)
POTASSIUM: 5 meq/L (ref 3.5–5.1)
SODIUM: 136 meq/L (ref 135–145)

## 2015-10-29 LAB — LIPID PANEL
Cholesterol: 172 mg/dL (ref 0–200)
HDL: 35.3 mg/dL — AB (ref >39.00)
Total CHOL/HDL Ratio: 5

## 2015-10-29 LAB — HEMOGLOBIN A1C: Hgb A1c MFr Bld: 6.4 % (ref 4.6–6.5)

## 2015-10-29 LAB — LDL CHOLESTEROL, DIRECT: LDL DIRECT: 63 mg/dL

## 2015-10-29 NOTE — Addendum Note (Signed)
Addended by: Desmond Dike L on: 10/29/2015 10:06 AM   Modules accepted: Orders

## 2015-10-29 NOTE — Assessment & Plan Note (Signed)
Pt is currently diet controlled after stopping Metformin due to hypoglycemia.  UTD on eye exam.  Foot exam done today.  On ARB for renal protection.  Check labs.  Start meds prn.  Will continue to follow.

## 2015-10-29 NOTE — Progress Notes (Signed)
Pre visit review using our clinic review tool, if applicable. No additional management support is needed unless otherwise documented below in the visit note. 

## 2015-10-29 NOTE — Progress Notes (Signed)
   Subjective:    Patient ID: Matthew Green, male    DOB: [birth year], 50 y.o.   MRN: [id number]  HPI DM- chronic problem, currently diet controlled.  Due for foot exam, UTD on eye exam.  On ARB for renal protection.  Pt has gained 5 lbs.  Exercising regularly but not as much due to heat.  No CP, SOB, HAs, visual changes, edema.  No numbness/tingling of hands/feet.  No sores on feet.   Review of Systems For ROS see HPI     Objective:   Physical Exam  Constitutional: He is oriented to person, place, and time. He appears well-developed and well-nourished. No distress.  HENT:  Head: Normocephalic and atraumatic.  Eyes: Conjunctivae and EOM are normal. Pupils are equal, round, and reactive to light.  Neck: Normal range of motion. Neck supple. No thyromegaly present.  Cardiovascular: Normal rate, regular rhythm, normal heart sounds and intact distal pulses.   No murmur heard. Pulmonary/Chest: Effort normal and breath sounds normal. No respiratory distress.  Abdominal: Soft. Bowel sounds are normal. He exhibits no distension.  Musculoskeletal: He exhibits no edema.  Lymphadenopathy:    He has no cervical adenopathy.  Neurological: He is alert and oriented to person, place, and time. No cranial nerve deficit.  Skin: Skin is warm and dry.  Psychiatric: He has a normal mood and affect. His behavior is normal.  Vitals reviewed.         Assessment & Plan:

## 2015-10-29 NOTE — Patient Instructions (Signed)
Schedule your complete physical in 6 months We'll notify you of your lab results and make any changes if needed Continue to work on healthy diet and regular exercise- you look great!!! Call with any questions or concerns Enjoy the rest of your summer!!! 

## 2015-11-02 ENCOUNTER — Other Ambulatory Visit: Payer: Self-pay | Admitting: General Practice

## 2015-11-02 ENCOUNTER — Encounter: Payer: Self-pay | Admitting: Family Medicine

## 2015-11-02 DIAGNOSIS — E781 Pure hyperglyceridemia: Secondary | ICD-10-CM

## 2015-11-02 MED ORDER — FENOFIBRATE 160 MG PO TABS: 160.0000 mg | ORAL_TABLET | Freq: Every day | ORAL | 6 refills | Status: DC

## 2015-11-02 NOTE — Progress Notes (Signed)
Labs ordered.

## 2015-11-09 ENCOUNTER — Other Ambulatory Visit (HOSPITAL_COMMUNITY): Payer: Self-pay | Admitting: Cardiology

## 2015-11-10 ENCOUNTER — Other Ambulatory Visit (HOSPITAL_COMMUNITY): Payer: Self-pay | Admitting: Cardiology

## 2015-11-16 DIAGNOSIS — J3081 Allergic rhinitis due to animal (cat) (dog) hair and dander: Secondary | ICD-10-CM | POA: Diagnosis not present

## 2015-11-16 DIAGNOSIS — J3089 Other allergic rhinitis: Secondary | ICD-10-CM | POA: Diagnosis not present

## 2015-11-16 DIAGNOSIS — L509 Urticaria, unspecified: Secondary | ICD-10-CM | POA: Diagnosis not present

## 2015-11-16 DIAGNOSIS — J301 Allergic rhinitis due to pollen: Secondary | ICD-10-CM | POA: Diagnosis not present

## 2015-12-03 ENCOUNTER — Other Ambulatory Visit (HOSPITAL_COMMUNITY): Payer: Self-pay | Admitting: Internal Medicine

## 2015-12-04 ENCOUNTER — Other Ambulatory Visit (HOSPITAL_COMMUNITY): Payer: Self-pay | Admitting: *Deleted

## 2015-12-04 MED ORDER — SACUBITRIL-VALSARTAN 24-26 MG PO TABS: 1.0000 | ORAL_TABLET | Freq: Two times a day (BID) | ORAL | 6 refills | Status: DC

## 2015-12-05 ENCOUNTER — Other Ambulatory Visit (HOSPITAL_COMMUNITY): Payer: Self-pay | Admitting: Internal Medicine

## 2015-12-05 DIAGNOSIS — I5022 Chronic systolic (congestive) heart failure: Secondary | ICD-10-CM

## 2016-01-06 ENCOUNTER — Encounter (HOSPITAL_COMMUNITY): Payer: Self-pay

## 2016-01-06 ENCOUNTER — Ambulatory Visit (HOSPITAL_COMMUNITY)
Admission: RE | Admit: 2016-01-06 | Discharge: 2016-01-06 | Disposition: A | Discharge status: Home / Self Care | Payer: BLUE CROSS/BLUE SHIELD | Source: Ambulatory Visit | Attending: Cardiology | Admitting: Cardiology

## 2016-01-06 VITALS — BP 142/90 | HR 92 | Wt 186.5 lb

## 2016-01-06 DIAGNOSIS — Z79899 Other long term (current) drug therapy: Secondary | ICD-10-CM | POA: Insufficient documentation

## 2016-01-06 DIAGNOSIS — Z87891 Personal history of nicotine dependence: Secondary | ICD-10-CM | POA: Diagnosis not present

## 2016-01-06 DIAGNOSIS — Z8249 Family history of ischemic heart disease and other diseases of the circulatory system: Secondary | ICD-10-CM | POA: Insufficient documentation

## 2016-01-06 DIAGNOSIS — Z86711 Personal history of pulmonary embolism: Secondary | ICD-10-CM | POA: Diagnosis not present

## 2016-01-06 DIAGNOSIS — Z7901 Long term (current) use of anticoagulants: Secondary | ICD-10-CM | POA: Diagnosis not present

## 2016-01-06 DIAGNOSIS — Z7289 Other problems related to lifestyle: Secondary | ICD-10-CM | POA: Insufficient documentation

## 2016-01-06 DIAGNOSIS — I2699 Other pulmonary embolism without acute cor pulmonale: Secondary | ICD-10-CM | POA: Diagnosis not present

## 2016-01-06 DIAGNOSIS — I5022 Chronic systolic (congestive) heart failure: Secondary | ICD-10-CM | POA: Insufficient documentation

## 2016-01-06 NOTE — Patient Instructions (Signed)
Labs every 4 months  Your physician has requested that you have an echocardiogram. Echocardiography is a painless test that uses sound waves to create images of your heart. It provides your doctor with information about the size and shape of your heart and how well your heart's chambers and valves are working. This procedure takes approximately one hour. There are no restrictions for this procedure.  We will contact you in 1 year to schedule your next appointment.

## 2016-01-06 NOTE — Addendum Note (Signed)
Encounter addended by: Larey Dresser, MD on: 01/06/2016 11:15 PM<BR>    Actions taken: Sign clinical note

## 2016-01-06 NOTE — Progress Notes (Addendum)
Patient ID: Matthew Green, male   DOB: 1965/12/31, 50 y.o.   MRN: [id number] PCP: Dr. Birdie Riddle Cardiology: Dr. Aundra Dubin  50 yo with history of tobacco use and heavy ETOH use developed exertional dyspnea and orthopnea about 3 weeks prior to admission to [facility] in 12/15.  No chest pain.  He was admitted 03/21/14 and found to have a PE.  Lower extremity US showed no DVT.  He had no known trigger: no surgery or prolonged immobility.  His father had a DVT after a leg injury.  As part of the workup, he had an echo showing EF 20-25% with diffuse hypokinesis.  He was noted to be volume overloaded and was diuresed.  He was started on apixaban.  His brother and multiple family members on his mother's side have a history of cardiomyopathy.  Of note, he has been a heavy drinker, up to 12 beers/day, since his divorce.  He was also a heavy smoker until around the time of his CHF admission, when he quit.  Echo was repeated in 4/16, showing EF 50-55% with trace MR.    He returns for followup.  He has not been here in over a year.  He is working full time with no problems.  Overall feels good. Denies SOB/PND/orthopnea. He walks 5-7 miles/day and does some jogging.  No chest pain.  No melena or BRBPR.  No smoking, drinks a few beers on the weekend but much less than in the past.  He has not used any Lasix. No orthostatic symptoms.  Labs  (03/25/14): K 4.6, creatinine 1.29, HCT 43.5, BNP 987, TnI 0.05, AST/ALT normal, LDL 64, TSH normal, lupus anticoagulant negative, beta-2 glycoprotein negative, anticardiolipin antibody negative. Factor V Leiden homozygote.  Negative for prothrombin gene mutation.  (03/27/14): Dig level 0.7  (1/16): K 4.2, creatinine 0.9, LFTs normal (3/16): K 3.7, creatinine 1.02, BNP 82 (4/16): K 4.2, creatinine 0.92, digoxin 0.3 (8/17): K 5, creatinine 0.87, LDL 63, TGs 484, HDL 35  PMH: 1. PE: Diagnosed by CTA in 12/15. Lower extremity US was negative.  There was no trigger.  Lupus anticoagulant  negative, beta-2 glycoprotein negative, anticardiolipin antibody negative, prothrombin gene mutation negative.  Factor V Leiden heterozygote.  2. Cardiomyopathy: Echo (12/15) with EF 20-25%, mildly dilated LV with diffuse hypokinesis, RV mildly dilated with moderately decreased systolic function, PA systolic pressure 44 mmHg. Cardiac MRI (1/16) with LV EF 34%, global hypokinesis; RV moderately hypokinetic, EF 30%, possible subtle mid-wall LGE in the anterior wall (not a coronary pattern).  Echo (4/16) with EF 50-55%, trace MR.  3. 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 .   SH: Lives in [place], works as Teacher, adult education, divorced.  Drank 12 beers/day, now occasional ETOH on the weekend but not heavy.  Smoked 1/2-1 ppd, has now quit.   FH: Father with DVT after leg injury.  Mother, aunts, and brother with cardiomyopathy/CHF.   ROS: All systems reviewed and negative except as per HPI.   Current Outpatient Prescriptions  Medication Sig Dispense Refill  . carvedilol (COREG) 12.5 MG tablet TAKE 1 TABLET (12.5 MG TOTAL) BY MOUTH 2 (TWO) TIMES DAILY WITH A MEAL. 60 tablet 3  . ELIQUIS 2.5 MG TABS tablet TAKE 1 TABLET BY MOUTH 2 TIMES DAILY 60 tablet 6  . sacubitril-valsartan (ENTRESTO) 24-26 MG Take 1 tablet by mouth 2 (two) times daily. 60 tablet 6  . spironolactone (ALDACTONE) 25 MG tablet TAKE 1 TABLET (25  MG TOTAL) BY MOUTH DAILY. 30 tablet 3   No current facility-administered medications for this encounter.    BP (!) 142/90   Pulse 92   Wt 186 lb 8 oz (84.6 kg)   SpO2 99%   BMI 28.36 kg/m  General: NAD Neck: JVP flat, no thyromegaly or thyroid nodule.  Lungs: Clear to auscultation bilaterally with normal respiratory effort. CV: Nondisplaced PMI.  Heart regular S1/S2, no S3/S4, no murmur.  No edema.  No carotid bruit.  Normal pedal pulses.  Abdomen: Soft, nontender, no hepatosplenomegaly, no distention.  Skin: Intact without lesions  or rashes.  Neurologic: Alert and oriented x 3.  Psych: Normal affect. Extremities: No clubbing or cyanosis.  HEENT: Normal.   Assessment/Plan: 1. PE: Patient developed a PE with no obvious trigger. His father had a DVT but it seems to have happened after a leg injury. He is a Factor V Leiden heterozygote. - Given spontaneous DVT with Factor V Leiden heterozygosity, would be reasonable to continue apixaban 2.5 mg bid long-term.   2. Chronic systolic CHF: Suspect nonischemic cardiomyopathy.  Heavy ETOH use may have contributed to cardiomyopathy.  However, he does have a strong family history of CMP (brother, mother and members of mother's family).  This may be a familial cardiomyopathy. ECHO 03/22/14 EF 20-25%.  Cardiac MRI in 1/16 showed LV EF 34%, RV EF 30%.  There was subtle mid-wall anterior LGE.  This did not appear to be a CAD pattern. NYHA class I symptoms currently. Repeat echo in 4/16 showed EF improved considerably to 50-55%, suggesting that heavy ETOH may have played a large role with improvement after cutting back.  - He will continue to use Lasix only prn.   - Continue current Coreg, spironolactone, and Entresto.  Repeat BMET due in 11/17, would like to see him get BMETs every 3-4 months on this medication regimen.  - I am going to get an echo to confirm that EF remains improved.   - His son and daughter should have echoes given concern for familial cardiomyopathy.  3. Ureteral dilation: Noted on CT while in hospital.  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  4. ETOH: I encouraged him to keep ETOH to a minimum.   5. Elevated triglycerides: I suggested that he take fish oil 2 g bid.   Followup in 3 months   [name] 52/01/2016

## 2016-01-06 NOTE — Addendum Note (Signed)
Encounter addended by: Larey Dresser, MD on: 01/06/2016 11:25 PM<BR>    Actions taken: Sign clinical note

## 2016-01-20 ENCOUNTER — Other Ambulatory Visit (HOSPITAL_COMMUNITY): Payer: Self-pay | Admitting: Internal Medicine

## 2016-02-25 ENCOUNTER — Other Ambulatory Visit (INDEPENDENT_AMBULATORY_CARE_PROVIDER_SITE_OTHER): Payer: BLUE CROSS/BLUE SHIELD

## 2016-02-25 DIAGNOSIS — E781 Pure hyperglyceridemia: Secondary | ICD-10-CM

## 2016-02-25 LAB — HEPATIC FUNCTION PANEL
ALT: 31 U/L (ref 0–53)
AST: 23 U/L (ref 0–37)
Albumin: 4.2 g/dL (ref 3.5–5.2)
Alkaline Phosphatase: 48 U/L (ref 39–117)
BILIRUBIN DIRECT: 0.1 mg/dL (ref 0.0–0.3)
TOTAL PROTEIN: 6.7 g/dL (ref 6.0–8.3)
Total Bilirubin: 0.5 mg/dL (ref 0.2–1.2)

## 2016-02-28 ENCOUNTER — Other Ambulatory Visit (HOSPITAL_COMMUNITY): Payer: Self-pay | Admitting: Cardiology

## 2016-03-07 ENCOUNTER — Other Ambulatory Visit: Payer: Self-pay

## 2016-03-07 ENCOUNTER — Ambulatory Visit (HOSPITAL_COMMUNITY): Payer: BLUE CROSS/BLUE SHIELD | Attending: Cardiology

## 2016-03-07 DIAGNOSIS — I5022 Chronic systolic (congestive) heart failure: Secondary | ICD-10-CM

## 2016-03-09 ENCOUNTER — Telehealth (HOSPITAL_COMMUNITY): Payer: Self-pay

## 2016-03-09 NOTE — Telephone Encounter (Signed)
ECHOCARDIOGRAM COMPLETE  Order: [id number]  Status:  Final result Visible to patient:  Yes (MyChart) Next appt:  05/03/2016 at 08:00 AM in Family Medicine Annye Asa, MD) Dx:  Chronic systolic CHF (congestive hear...  Notes Recorded by Effie Berkshire, RN on 03/09/2016 at 10:56 AM EST Patient aware and appreciative of call. Would like to know if he can come off of eliquis and transition to baby aspirin. Will forward to Dr. Aundra Dubin to advise. ------  Notes Recorded by Larey Dresser, MD on 03/07/2016 at 4:06 PM EST EF back to normal completely. Good news.

## 2016-03-09 NOTE — Telephone Encounter (Signed)
PE occurred in the setting of low EF, so risk is lower with EF improved.  However, he is a heterozygote for Factor V Leiden, which increases risk of clotting generally.  If he is doing fine with no problems on apixaban 2.5 mg bid, would be reasonable to continue long-term.  However, if he has had bleeding issues or trouble getting the med, can go to [facility] 81 daily.

## 2016-03-23 NOTE — Telephone Encounter (Signed)
Spoke w/pt, he is agreeable to continue Eliquis for now

## 2016-04-15 ENCOUNTER — Encounter: Payer: Self-pay | Admitting: Family Medicine

## 2016-04-21 ENCOUNTER — Other Ambulatory Visit (HOSPITAL_COMMUNITY): Payer: Self-pay | Admitting: Internal Medicine

## 2016-05-03 ENCOUNTER — Ambulatory Visit (INDEPENDENT_AMBULATORY_CARE_PROVIDER_SITE_OTHER): Payer: BLUE CROSS/BLUE SHIELD | Admitting: Family Medicine

## 2016-05-03 ENCOUNTER — Encounter: Payer: Self-pay | Admitting: Family Medicine

## 2016-05-03 VITALS — BP 122/80 | HR 76 | Temp 98.1°F | Resp 16 | Ht 68.0 in | Wt 190.0 lb

## 2016-05-03 DIAGNOSIS — Z125 Encounter for screening for malignant neoplasm of prostate: Secondary | ICD-10-CM | POA: Diagnosis not present

## 2016-05-03 DIAGNOSIS — Z23 Encounter for immunization: Secondary | ICD-10-CM | POA: Diagnosis not present

## 2016-05-03 DIAGNOSIS — Z0000 Encounter for general adult medical examination without abnormal findings: Secondary | ICD-10-CM | POA: Diagnosis not present

## 2016-05-03 DIAGNOSIS — R7989 Other specified abnormal findings of blood chemistry: Secondary | ICD-10-CM

## 2016-05-03 DIAGNOSIS — E119 Type 2 diabetes mellitus without complications: Secondary | ICD-10-CM

## 2016-05-03 LAB — CBC WITH DIFFERENTIAL/PLATELET
BASOS ABS: 0 10*3/uL (ref 0.0–0.1)
Basophils Relative: 0.3 % (ref 0.0–3.0)
EOS ABS: 0.2 10*3/uL (ref 0.0–0.7)
Eosinophils Relative: 3.2 % (ref 0.0–5.0)
HCT: 42 % (ref 39.0–52.0)
Hemoglobin: 14.4 g/dL (ref 13.0–17.0)
LYMPHS ABS: 2.2 10*3/uL (ref 0.7–4.0)
Lymphocytes Relative: 36.1 % (ref 12.0–46.0)
MCHC: 34.4 g/dL (ref 30.0–36.0)
MCV: 98.5 fl (ref 78.0–100.0)
Monocytes Absolute: 0.3 10*3/uL (ref 0.1–1.0)
Monocytes Relative: 5.7 % (ref 3.0–12.0)
NEUTROS ABS: 3.3 10*3/uL (ref 1.4–7.7)
NEUTROS PCT: 54.7 % (ref 43.0–77.0)
PLATELETS: 277 10*3/uL (ref 150.0–400.0)
RBC: 4.26 Mil/uL (ref 4.22–5.81)
RDW: 12.8 % (ref 11.5–15.5)
WBC: 6.1 10*3/uL (ref 4.0–10.5)

## 2016-05-03 LAB — BASIC METABOLIC PANEL
BUN: 11 mg/dL (ref 6–23)
CALCIUM: 9.4 mg/dL (ref 8.4–10.5)
CO2: 27 meq/L (ref 19–32)
Chloride: 101 mEq/L (ref 96–112)
Creatinine, Ser: 0.89 mg/dL (ref 0.40–1.50)
GFR: 95.18 mL/min (ref >60.00)
GLUCOSE: 175 mg/dL — AB (ref 70–99)
Potassium: 4.6 mEq/L (ref 3.5–5.1)
SODIUM: 134 meq/L — AB (ref 135–145)

## 2016-05-03 LAB — HEPATIC FUNCTION PANEL
ALBUMIN: 4.3 g/dL (ref 3.5–5.2)
ALK PHOS: 60 U/L (ref 39–117)
ALT: 18 U/L (ref 0–53)
AST: 16 U/L (ref 0–37)
BILIRUBIN DIRECT: 0.1 mg/dL (ref 0.0–0.3)
TOTAL PROTEIN: 7 g/dL (ref 6.0–8.3)
Total Bilirubin: 0.4 mg/dL (ref 0.2–1.2)

## 2016-05-03 LAB — LIPID PANEL
Cholesterol: 188 mg/dL (ref 0–200)
HDL: 30.7 mg/dL — ABNORMAL LOW (ref >39.00)
Total CHOL/HDL Ratio: 6
Triglycerides: 461 mg/dL — ABNORMAL HIGH (ref 0.0–149.0)

## 2016-05-03 LAB — TSH: TSH: 0.86 u[IU]/mL (ref 0.35–4.50)

## 2016-05-03 LAB — PSA: PSA: 0.64 ng/mL (ref 0.10–4.00)

## 2016-05-03 LAB — LDL CHOLESTEROL, DIRECT: LDL DIRECT: 82 mg/dL

## 2016-05-03 LAB — HEMOGLOBIN A1C: Hgb A1c MFr Bld: 7.4 % — ABNORMAL HIGH (ref 4.6–6.5)

## 2016-05-03 IMAGING — DX DG CHEST 2V
2 series · 2 of 2 positions shown · non-contrast
Comparison: None.

CLINICAL DATA: Shortness of breath 1 week. Given medication 3 days
ago for fluid retention and has had a dry cough since that time with
hemoptysis today.

EXAM:
CHEST  2 VIEW

[chest pa]
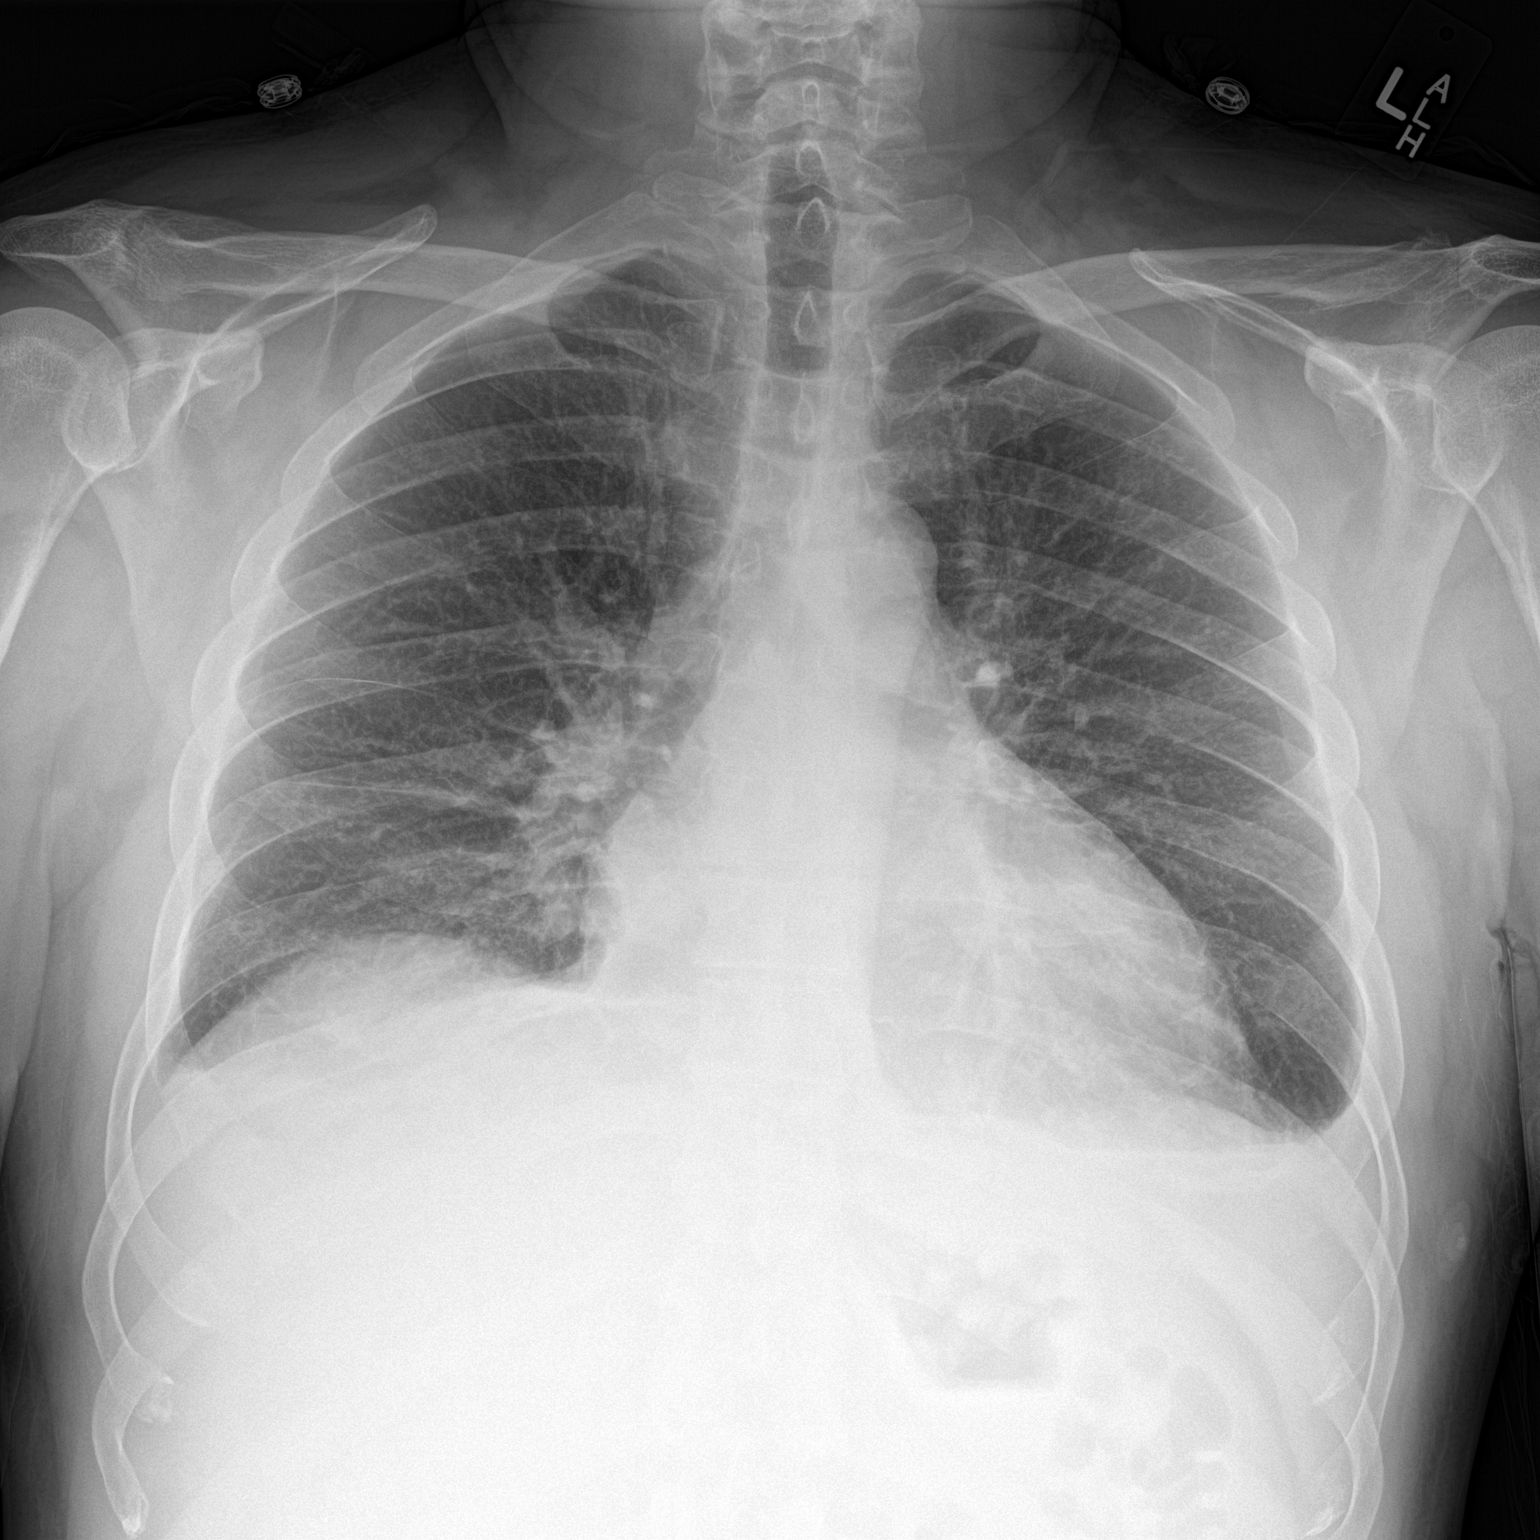

[chest lat]
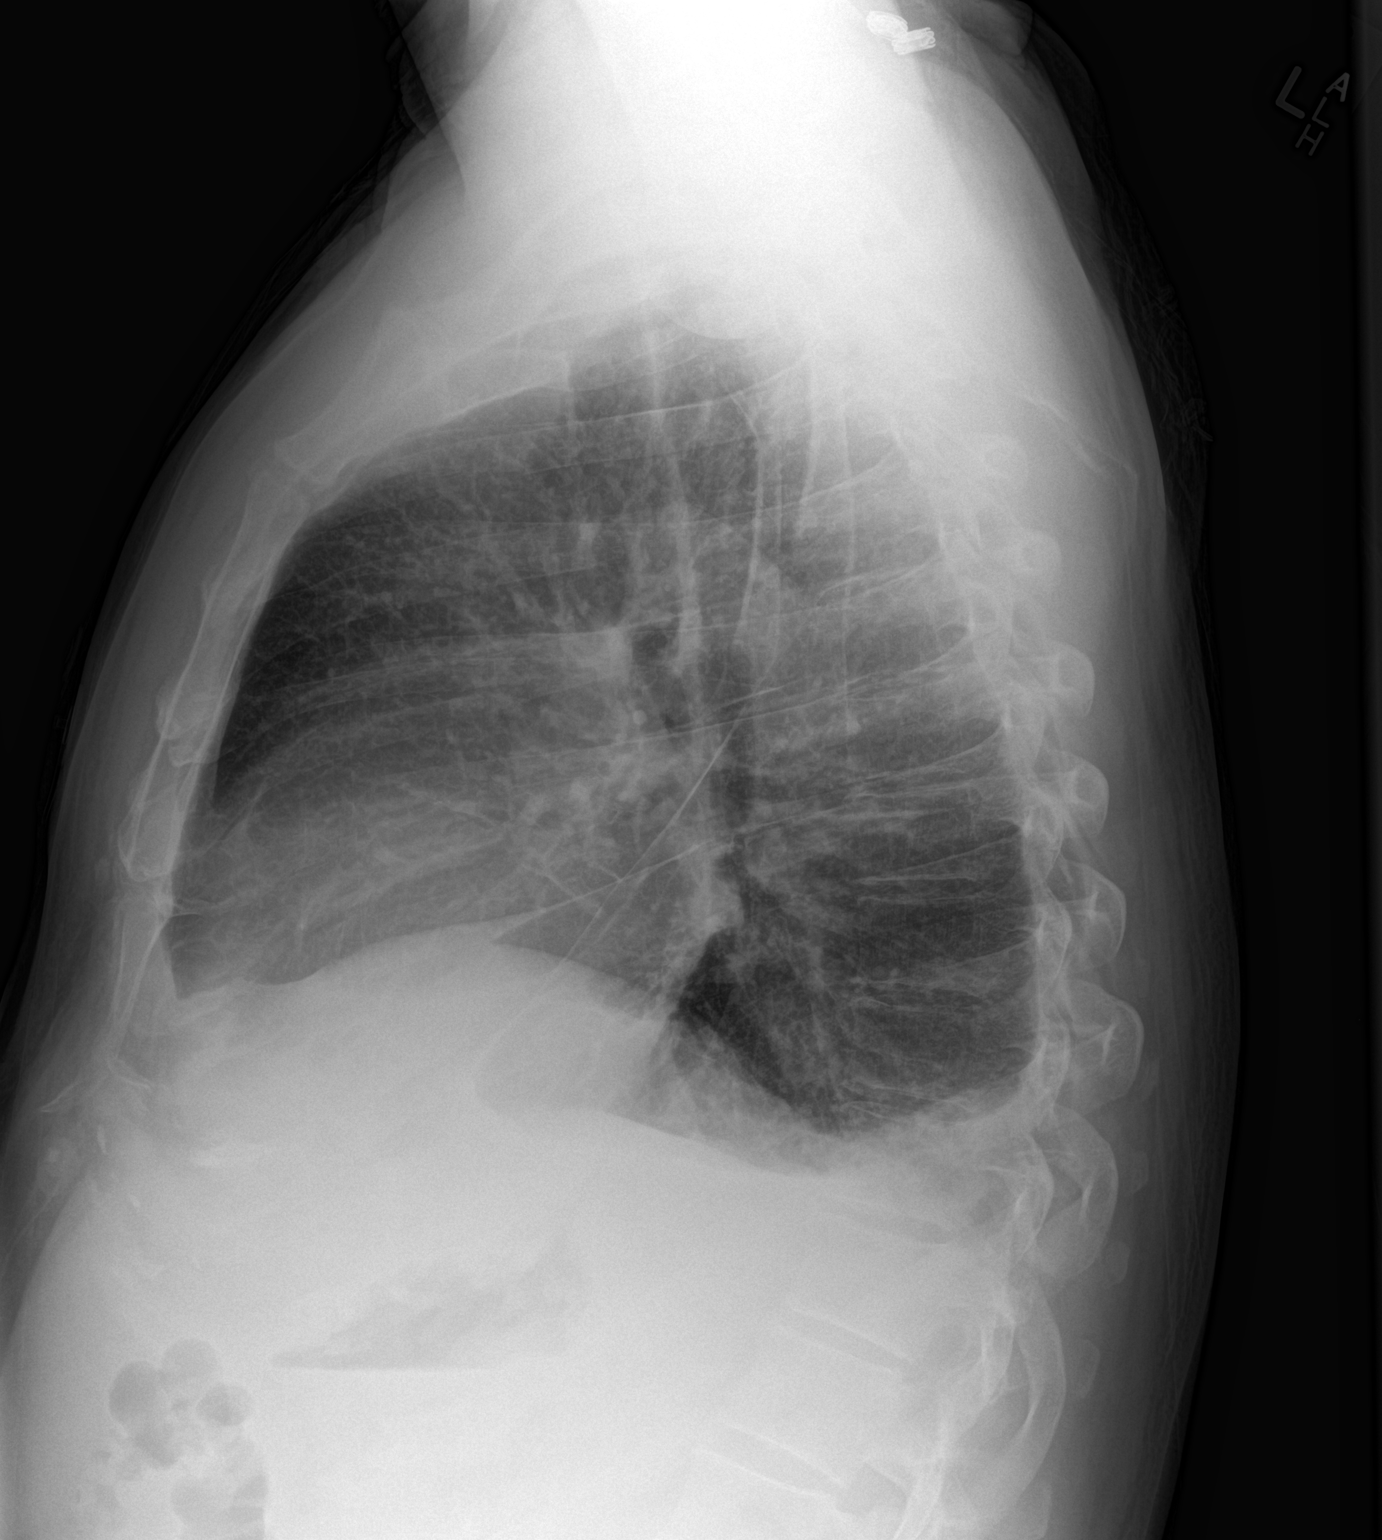

[2 of 2 positions shown; findings below may reference images not displayed]

FINDINGS: Lungs are hypoinflated with blunting of the left costophrenic angle
and minimal blunting of the right costophrenic angle compatible with
small effusions left greater than right. Minimal prominence of the
perihilar markings as cannot exclude a mild degree of vascular
congestion. Cardiomediastinal silhouette is within normal. There is
minimal spondylosis of the thoracic spine.
IMPRESSION: Hypoinflation with findings suggesting small bilateral pleural
effusions left greater than right. Possible mild vascular
congestion.

## 2016-05-03 NOTE — Progress Notes (Signed)
   Subjective:    Patient ID: Matthew Green, male    DOB: June 05, 1965, 51 y.o.   MRN: [id number]  HPI CPE- UTD on cologuard, Tdap, Pneumovax, due for flu.  Due for eye exam.    Review of Systems Patient reports no vision/hearing changes, anorexia, fever ,adenopathy, persistant/recurrent hoarseness, swallowing issues, chest pain, palpitations, edema, persistant/recurrent cough, hemoptysis, dyspnea (rest,exertional, paroxysmal nocturnal), gastrointestinal  bleeding (melena, rectal bleeding), abdominal pain, excessive heart burn, GU symptoms (dysuria, hematuria, voiding/incontinence issues) syncope, focal weakness, memory loss, numbness & tingling, skin/hair/nail changes, depression, anxiety, abnormal bruising/bleeding, musculoskeletal symptoms/signs.     Objective:   Physical Exam General Appearance:    Alert, cooperative, no distress, appears stated age  Head:    Normocephalic, without obvious abnormality, atraumatic  Eyes:    PERRL, conjunctiva/corneas clear, EOM's intact, fundi    benign, both eyes       Ears:    Normal TM's and external ear canals, both ears  Nose:   Nares normal, septum midline, mucosa normal, no drainage   or sinus tenderness  Throat:   Lips, mucosa, and tongue normal; teeth and gums normal  Neck:   Supple, symmetrical, trachea midline, no adenopathy;       thyroid:  No enlargement/tenderness/nodules  Back:     Symmetric, no curvature, ROM normal, no CVA tenderness  Lungs:     Clear to auscultation bilaterally, respirations unlabored  Chest wall:    No tenderness or deformity  Heart:    Regular rate and rhythm, S1 and S2 normal, no murmur, rub   or gallop  Abdomen:     Soft, non-tender, bowel sounds active all four quadrants,    no masses, no organomegaly  Genitalia:    Deferred at pt's request  Rectal:    Extremities:   Extremities normal, atraumatic, no cyanosis or edema  Pulses:   2+ and symmetric all extremities  Skin:   Skin color, texture, turgor normal,  no rashes or lesions  Lymph nodes:   Cervical, supraclavicular, and axillary nodes normal  Neurologic:   CNII-XII intact. Normal strength, sensation and reflexes      throughout          Assessment & Plan:

## 2016-05-03 NOTE — Progress Notes (Signed)
Pre visit review using our clinic review tool, if applicable. No additional management support is needed unless otherwise documented below in the visit note. 

## 2016-05-03 NOTE — Patient Instructions (Signed)
Follow up in 6 months to recheck cholesterol and sugar We'll notify you of your lab results and make any changes if needed Continue to work on healthy diet and regular exercise- you look great! Call with any questions or concerns Happy Valentine's Day!!!

## 2016-05-03 NOTE — Assessment & Plan Note (Signed)
Chronic problem.  Currently diet controlled.  Asymptomatic.  On ARB for renal protection.  UTD on foot exam.  Due for eye exam- pt to schedule.  Check labs.  Adjust tx plan prn.

## 2016-05-03 NOTE — Assessment & Plan Note (Signed)
Pt's PE WNL.  UTD on Cologuard, Tdap, Pneumovax.  Flu shot given today.  Check labs.  Anticipatory guidance provided.

## 2016-05-04 ENCOUNTER — Encounter: Payer: Self-pay | Admitting: Family Medicine

## 2016-05-04 ENCOUNTER — Other Ambulatory Visit: Payer: Self-pay | Admitting: General Practice

## 2016-05-04 DIAGNOSIS — E119 Type 2 diabetes mellitus without complications: Secondary | ICD-10-CM

## 2016-05-04 MED ORDER — FENOFIBRATE 160 MG PO TABS: 160.0000 mg | ORAL_TABLET | Freq: Every day | ORAL | 6 refills | Status: DC

## 2016-05-09 ENCOUNTER — Other Ambulatory Visit (HOSPITAL_COMMUNITY): Payer: BLUE CROSS/BLUE SHIELD

## 2016-05-14 LAB — HM DIABETES EYE EXAM

## 2016-05-21 ENCOUNTER — Other Ambulatory Visit (HOSPITAL_COMMUNITY): Payer: Self-pay | Admitting: Internal Medicine

## 2016-05-23 MED ORDER — CARVEDILOL 12.5 MG PO TABS: ORAL_TABLET | ORAL | 11 refills | Status: DC

## 2016-05-23 NOTE — Addendum Note (Signed)
Addended by: Kerry Dory on: 05/23/2016 04:29 PM   Modules accepted: Orders

## 2016-05-26 ENCOUNTER — Other Ambulatory Visit (HOSPITAL_COMMUNITY): Payer: Self-pay | Admitting: Cardiology

## 2016-06-11 ENCOUNTER — Other Ambulatory Visit (HOSPITAL_COMMUNITY): Payer: Self-pay | Admitting: Internal Medicine

## 2016-06-11 ENCOUNTER — Other Ambulatory Visit (HOSPITAL_COMMUNITY): Payer: Self-pay | Admitting: Cardiology

## 2016-06-11 DIAGNOSIS — I5022 Chronic systolic (congestive) heart failure: Secondary | ICD-10-CM

## 2016-06-13 NOTE — Telephone Encounter (Signed)
Refill request received for Eliquis 2.5mg . Last seen by Dr. Aundra Dubin on 01/06/16 & stated in his progress note "Given spontaneous DVT with Factor V Leiden heterozygosity, would be reasonable to continue apixaban 2.5 mg bid long-term", wt-86.2kg on 05/03/16, pt's  age-51yrs old, Crea-0.89 on 05/03/16. Will send in refill as requested & discussed with Pharmacist regarding dosing.

## 2016-06-21 DIAGNOSIS — L853 Xerosis cutis: Secondary | ICD-10-CM | POA: Diagnosis not present

## 2016-06-21 DIAGNOSIS — D1801 Hemangioma of skin and subcutaneous tissue: Secondary | ICD-10-CM | POA: Diagnosis not present

## 2016-06-21 DIAGNOSIS — L570 Actinic keratosis: Secondary | ICD-10-CM | POA: Diagnosis not present

## 2016-06-21 DIAGNOSIS — L812 Freckles: Secondary | ICD-10-CM | POA: Diagnosis not present

## 2016-06-21 DIAGNOSIS — L578 Other skin changes due to chronic exposure to nonionizing radiation: Secondary | ICD-10-CM | POA: Diagnosis not present

## 2016-08-12 ENCOUNTER — Other Ambulatory Visit (INDEPENDENT_AMBULATORY_CARE_PROVIDER_SITE_OTHER): Payer: BLUE CROSS/BLUE SHIELD

## 2016-08-12 DIAGNOSIS — E119 Type 2 diabetes mellitus without complications: Secondary | ICD-10-CM

## 2016-08-12 LAB — BASIC METABOLIC PANEL
BUN: 11 mg/dL (ref 6–23)
CALCIUM: 9.5 mg/dL (ref 8.4–10.5)
CO2: 28 mEq/L (ref 19–32)
CREATININE: 1 mg/dL (ref 0.40–1.50)
Chloride: 100 mEq/L (ref 96–112)
GFR: 83.11 mL/min (ref >60.00)
GLUCOSE: 182 mg/dL — AB (ref 70–99)
Potassium: 4.2 mEq/L (ref 3.5–5.1)
SODIUM: 135 meq/L (ref 135–145)

## 2016-08-12 LAB — HEMOGLOBIN A1C: Hgb A1c MFr Bld: 7.6 % — ABNORMAL HIGH (ref 4.6–6.5)

## 2016-08-15 ENCOUNTER — Encounter: Payer: Self-pay | Admitting: Family Medicine

## 2016-11-01 ENCOUNTER — Ambulatory Visit: Payer: BLUE CROSS/BLUE SHIELD | Admitting: Family Medicine

## 2016-11-11 ENCOUNTER — Ambulatory Visit (INDEPENDENT_AMBULATORY_CARE_PROVIDER_SITE_OTHER): Payer: BLUE CROSS/BLUE SHIELD | Admitting: Family Medicine

## 2016-11-11 ENCOUNTER — Encounter: Payer: Self-pay | Admitting: Family Medicine

## 2016-11-11 ENCOUNTER — Encounter: Payer: Self-pay | Admitting: General Practice

## 2016-11-11 VITALS — BP 126/80 | HR 77 | Temp 98.0°F | Resp 16 | Ht 68.0 in | Wt 192.5 lb

## 2016-11-11 DIAGNOSIS — E119 Type 2 diabetes mellitus without complications: Secondary | ICD-10-CM

## 2016-11-11 DIAGNOSIS — I10 Essential (primary) hypertension: Secondary | ICD-10-CM | POA: Diagnosis not present

## 2016-11-11 DIAGNOSIS — E781 Pure hyperglyceridemia: Secondary | ICD-10-CM | POA: Diagnosis not present

## 2016-11-11 DIAGNOSIS — E785 Hyperlipidemia, unspecified: Secondary | ICD-10-CM | POA: Insufficient documentation

## 2016-11-11 LAB — CBC WITH DIFFERENTIAL/PLATELET
BASOS PCT: 0.5 % (ref 0.0–3.0)
Basophils Absolute: 0 10*3/uL (ref 0.0–0.1)
EOS PCT: 3.2 % (ref 0.0–5.0)
Eosinophils Absolute: 0.2 10*3/uL (ref 0.0–0.7)
HEMATOCRIT: 42.5 % (ref 39.0–52.0)
Hemoglobin: 14.7 g/dL (ref 13.0–17.0)
LYMPHS ABS: 1.8 10*3/uL (ref 0.7–4.0)
Lymphocytes Relative: 29.5 % (ref 12.0–46.0)
MCHC: 34.5 g/dL (ref 30.0–36.0)
MCV: 99.9 fl (ref 78.0–100.0)
MONOS PCT: 5.6 % (ref 3.0–12.0)
Monocytes Absolute: 0.3 10*3/uL (ref 0.1–1.0)
Neutro Abs: 3.7 10*3/uL (ref 1.4–7.7)
Neutrophils Relative %: 61.2 % (ref 43.0–77.0)
Platelets: 206 10*3/uL (ref 150.0–400.0)
RBC: 4.25 Mil/uL (ref 4.22–5.81)
RDW: 12.4 % (ref 11.5–15.5)
WBC: 6.1 10*3/uL (ref 4.0–10.5)

## 2016-11-11 LAB — HEPATIC FUNCTION PANEL
ALT: 34 U/L (ref 0–53)
AST: 22 U/L (ref 0–37)
Albumin: 4.2 g/dL (ref 3.5–5.2)
Alkaline Phosphatase: 39 U/L (ref 39–117)
BILIRUBIN DIRECT: 0.1 mg/dL (ref 0.0–0.3)
BILIRUBIN TOTAL: 0.7 mg/dL (ref 0.2–1.2)
Total Protein: 6.6 g/dL (ref 6.0–8.3)

## 2016-11-11 LAB — BASIC METABOLIC PANEL
BUN: 11 mg/dL (ref 6–23)
CALCIUM: 9.4 mg/dL (ref 8.4–10.5)
CHLORIDE: 103 meq/L (ref 96–112)
CO2: 29 mEq/L (ref 19–32)
CREATININE: 1.05 mg/dL (ref 0.40–1.50)
GFR: 78.49 mL/min (ref >60.00)
Glucose, Bld: 176 mg/dL — ABNORMAL HIGH (ref 70–99)
Potassium: 4.2 mEq/L (ref 3.5–5.1)
Sodium: 137 mEq/L (ref 135–145)

## 2016-11-11 LAB — LIPID PANEL
CHOL/HDL RATIO: 6
Cholesterol: 164 mg/dL (ref 0–200)
HDL: 28.9 mg/dL — AB (ref >39.00)
LDL CALC: 97 mg/dL (ref 0–99)
NONHDL: 134.82
Triglycerides: 187 mg/dL — ABNORMAL HIGH (ref 0.0–149.0)
VLDL: 37.4 mg/dL (ref 0.0–40.0)

## 2016-11-11 LAB — HEMOGLOBIN A1C: Hgb A1c MFr Bld: 7.2 % — ABNORMAL HIGH (ref 4.6–6.5)

## 2016-11-11 LAB — TSH: TSH: 1 u[IU]/mL (ref 0.35–4.50)

## 2016-11-11 NOTE — Progress Notes (Signed)
   Subjective:    Patient ID: Matthew Green, male    DOB: February 21, 1966, 51 y.o.   MRN: [id number]  HPI DM- chronic problem, currently diet controlled.  On ARB for renal protection.  UTD on eye exam.  Due for foot exam.  Exercising regularly.  Hyperlipidemia- chronic problem, on Fenofibrate.  No abd pain, N/V.  HTN- chronic problem, well controlled today on Entresto, Carvedilol, Spironolactone.  No CP, SOB, HAs, visual changes, edema.   Review of Systems For ROS see HPI     Objective:   Physical Exam  Constitutional: He is oriented to person, place, and time. He appears well-developed and well-nourished. No distress.  HENT:  Head: Normocephalic and atraumatic.  Eyes: Pupils are equal, round, and reactive to light. Conjunctivae and EOM are normal.  Neck: Normal range of motion. Neck supple. No thyromegaly present.  Cardiovascular: Normal rate, regular rhythm, normal heart sounds and intact distal pulses.   No murmur heard. Pulmonary/Chest: Effort normal and breath sounds normal. No respiratory distress.  Abdominal: Soft. Bowel sounds are normal. He exhibits no distension.  Musculoskeletal: He exhibits no edema.  Lymphadenopathy:    He has no cervical adenopathy.  Neurological: He is alert and oriented to person, place, and time. No cranial nerve deficit.  Skin: Skin is warm and dry.  Psychiatric: He has a normal mood and affect. His behavior is normal.  Vitals reviewed.         Assessment & Plan:

## 2016-11-11 NOTE — Assessment & Plan Note (Signed)
Chronic problem.  Excellent control.  Asymptomatic.  Check labs.  No anticipated med changes.  Will follow. 

## 2016-11-11 NOTE — Assessment & Plan Note (Signed)
Chronic problem.  Currently diet controlled.  Asymptomatic.  UTD on eye exam, foot exam done today.  On ARB for renal protection.  Stressed need for healthy diet and regular exercise.  Check labs.  Adjust meds prn

## 2016-11-11 NOTE — Patient Instructions (Signed)
Schedule your complete physical in 6 months We'll notify you of your lab results and make any changes if needed Continue to work on healthy diet and regular exercise- you look great!!! Call with any questions or concerns Enjoy the rest of your summer!!!

## 2016-11-11 NOTE — Assessment & Plan Note (Signed)
Chronic problem.  Tolerating fenofibrate w/o difficulty.  Check labs.  Adjust meds prn  

## 2016-11-11 NOTE — Progress Notes (Signed)
Pre visit review using our clinic review tool, if applicable. No additional management support is needed unless otherwise documented below in the visit note. 

## 2016-11-15 DIAGNOSIS — J3081 Allergic rhinitis due to animal (cat) (dog) hair and dander: Secondary | ICD-10-CM | POA: Diagnosis not present

## 2016-11-15 DIAGNOSIS — L509 Urticaria, unspecified: Secondary | ICD-10-CM | POA: Diagnosis not present

## 2016-11-15 DIAGNOSIS — J301 Allergic rhinitis due to pollen: Secondary | ICD-10-CM | POA: Diagnosis not present

## 2016-11-15 DIAGNOSIS — J3089 Other allergic rhinitis: Secondary | ICD-10-CM | POA: Diagnosis not present

## 2016-11-26 ENCOUNTER — Other Ambulatory Visit: Payer: Self-pay | Admitting: Family Medicine

## 2016-12-26 ENCOUNTER — Other Ambulatory Visit (HOSPITAL_COMMUNITY): Payer: Self-pay | Admitting: Cardiology

## 2016-12-26 DIAGNOSIS — I5022 Chronic systolic (congestive) heart failure: Secondary | ICD-10-CM

## 2017-01-23 ENCOUNTER — Other Ambulatory Visit (HOSPITAL_COMMUNITY): Payer: Self-pay | Admitting: Cardiology

## 2017-01-23 ENCOUNTER — Other Ambulatory Visit (HOSPITAL_COMMUNITY): Payer: Self-pay | Admitting: Internal Medicine

## 2017-01-23 DIAGNOSIS — I5022 Chronic systolic (congestive) heart failure: Secondary | ICD-10-CM

## 2017-02-25 ENCOUNTER — Other Ambulatory Visit (HOSPITAL_COMMUNITY): Payer: Self-pay | Admitting: Cardiology

## 2017-02-25 ENCOUNTER — Other Ambulatory Visit (HOSPITAL_COMMUNITY): Payer: Self-pay | Admitting: Internal Medicine

## 2017-02-25 DIAGNOSIS — I5022 Chronic systolic (congestive) heart failure: Secondary | ICD-10-CM

## 2017-02-28 ENCOUNTER — Other Ambulatory Visit (HOSPITAL_COMMUNITY): Payer: Self-pay | Admitting: *Deleted

## 2017-02-28 DIAGNOSIS — I5022 Chronic systolic (congestive) heart failure: Secondary | ICD-10-CM

## 2017-02-28 MED ORDER — SPIRONOLACTONE 25 MG PO TABS: 25.0000 mg | ORAL_TABLET | Freq: Every day | ORAL | 0 refills | Status: DC

## 2017-02-28 MED ORDER — APIXABAN 2.5 MG PO TABS: 2.5000 mg | ORAL_TABLET | Freq: Two times a day (BID) | ORAL | 0 refills | Status: DC

## 2017-02-28 MED ORDER — CARVEDILOL 12.5 MG PO TABS: ORAL_TABLET | ORAL | 11 refills | Status: DC

## 2017-03-01 ENCOUNTER — Other Ambulatory Visit (HOSPITAL_COMMUNITY): Payer: Self-pay | Admitting: *Deleted

## 2017-03-01 MED ORDER — SACUBITRIL-VALSARTAN 24-26 MG PO TABS: 1.0000 | ORAL_TABLET | Freq: Two times a day (BID) | ORAL | 2 refills | Status: DC

## 2017-03-26 ENCOUNTER — Other Ambulatory Visit (HOSPITAL_COMMUNITY): Payer: Self-pay | Admitting: Cardiology

## 2017-03-26 DIAGNOSIS — I5022 Chronic systolic (congestive) heart failure: Secondary | ICD-10-CM

## 2017-03-30 ENCOUNTER — Encounter (HOSPITAL_COMMUNITY): Payer: Self-pay | Admitting: Cardiology

## 2017-03-30 ENCOUNTER — Ambulatory Visit (HOSPITAL_COMMUNITY)
Admission: RE | Admit: 2017-03-30 | Discharge: 2017-03-30 | Disposition: A | Discharge status: Home / Self Care | Payer: BLUE CROSS/BLUE SHIELD | Source: Ambulatory Visit | Attending: Cardiology | Admitting: Cardiology

## 2017-03-30 VITALS — BP 172/95 | HR 86 | Wt 198.0 lb

## 2017-03-30 DIAGNOSIS — I5022 Chronic systolic (congestive) heart failure: Secondary | ICD-10-CM

## 2017-03-30 DIAGNOSIS — Z79899 Other long term (current) drug therapy: Secondary | ICD-10-CM | POA: Insufficient documentation

## 2017-03-30 DIAGNOSIS — Z87891 Personal history of nicotine dependence: Secondary | ICD-10-CM | POA: Diagnosis not present

## 2017-03-30 DIAGNOSIS — E781 Pure hyperglyceridemia: Secondary | ICD-10-CM | POA: Diagnosis not present

## 2017-03-30 LAB — BASIC METABOLIC PANEL
ANION GAP: 10 (ref 5–15)
BUN: 13 mg/dL (ref 6–20)
CALCIUM: 9.3 mg/dL (ref 8.9–10.3)
CO2: 23 mmol/L (ref 22–32)
CREATININE: 0.99 mg/dL (ref 0.61–1.24)
Chloride: 100 mmol/L — ABNORMAL LOW (ref 101–111)
Glucose, Bld: 179 mg/dL — ABNORMAL HIGH (ref 65–99)
Potassium: 3.7 mmol/L (ref 3.5–5.1)
Sodium: 133 mmol/L — ABNORMAL LOW (ref 135–145)

## 2017-03-30 NOTE — Patient Instructions (Signed)
Labs drawn today (if we do not call you, then your lab work was stable)   Your physician recommends that you schedule a follow-up appointment in: 1 year (we will call you)

## 2017-04-01 NOTE — Progress Notes (Signed)
Patient ID: Matthew Green, male   DOB: 02/06/1966, 52 y.o.   MRN: [id number] PCP: Dr. Birdie Riddle Cardiology: Dr. Aundra Dubin  52 y.o. with history of tobacco use and heavy ETOH use developed exertional dyspnea and orthopnea about 3 weeks prior to admission to [facility] in 12/15.  No chest pain.  He was admitted 03/21/14 and found to have a PE.  Lower extremity US showed no DVT.  He had no known trigger: no surgery or prolonged immobility.  His father had a DVT after a leg injury.  As part of the workup, he had an echo showing EF 20-25% with diffuse hypokinesis.  He was noted to be volume overloaded and was diuresed.  He was started on apixaban.  His brother and multiple family members on his mother's side have a history of cardiomyopathy.  Of note, he has been a heavy drinker, up to 12 beers/day, since his divorce.  He was also a heavy smoker until around the time of his CHF admission, when he quit.  Echo was repeated in 4/16, showing EF 50-55% with trace MR.    He returns for followup of CHF.  He is working full time with no problems.  Overall feels good. He kayaks a lot in his free time.  No exertional dyspnea, no chest pain.  He drinks occasionally but not heavily.  BP is elevated today but SBP runs in 120s at home (he checks on most days).  He has been taking all his meds.  No orthopnea/PND. No palpitations.   Labs  (03/25/14): K 4.6, creatinine 1.29, HCT 43.5, BNP 987, TnI 0.05, AST/ALT normal, LDL 64, TSH normal, lupus anticoagulant negative, beta-2 glycoprotein negative, anticardiolipin antibody negative. Factor V Leiden homozygote.  Negative for prothrombin gene mutation.  (03/27/14): Dig level 0.7  (1/16): K 4.2, creatinine 0.9, LFTs normal (3/16): K 3.7, creatinine 1.02, BNP 82 (4/16): K 4.2, creatinine 0.92, digoxin 0.3 (8/17): K 5, creatinine 0.87, LDL 63, TGs 484, HDL 35 (8/18): K 4.2, creatinine 1.05, hgb 14.7, TGs 187, LDL 97, HDL 29  PMH: 1. PE: Diagnosed by CTA in 12/15. Lower extremity  US was negative.  There was no trigger.  Lupus anticoagulant negative, beta-2 glycoprotein negative, anticardiolipin antibody negative, prothrombin gene mutation negative.  Factor V Leiden heterozygote.  2. Cardiomyopathy: Echo (12/15) with EF 20-25%, mildly dilated LV with diffuse hypokinesis, RV mildly dilated with moderately decreased systolic function, PA systolic pressure 44 mmHg. Cardiac MRI (1/16) with LV EF 34%, global hypokinesis; RV moderately hypokinetic, EF 30%, possible subtle mid-wall LGE in the anterior wall (not a coronary pattern).  Echo (4/16) with EF 50-55%, trace MR.  - Echo (12/17): EF 55-60%, normal RV.  3. 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 .   SH: Lives in [place], works as Teacher, adult education, divorced.  Drank 12 beers/day, now occasional ETOH on the weekend but not heavy.  Smoked 1/2-1 ppd, has now quit.   FH: Father with DVT after leg injury.  Mother, aunts, and brother with cardiomyopathy/CHF.   ROS: All systems reviewed and negative except as per HPI.   Current Outpatient Medications  Medication Sig Dispense Refill  . apixaban (ELIQUIS) 2.5 MG TABS tablet Take 1 tablet (2.5 mg total) by mouth 2 (two) times daily. Needs follow up 60 tablet 0  . carvedilol (COREG) 12.5 MG tablet TAKE 1 TABLET (12.5 MG TOTAL) BY MOUTH 2 (TWO) TIMES DAILY WITH A MEAL. 60 tablet 11  .  fenofibrate 160 MG tablet TAKE 1 TABLET (160 MG TOTAL) BY MOUTH DAILY. 30 tablet 6  . sacubitril-valsartan (ENTRESTO) 24-26 MG Take 1 tablet by mouth 2 (two) times daily. 60 tablet 2  . spironolactone (ALDACTONE) 25 MG tablet TAKE 1 TABLET (25 MG TOTAL) BY MOUTH DAILY. 90 tablet 3  . spironolactone (ALDACTONE) 25 MG tablet Take 1 tablet (25 mg total) by mouth daily. Needs follow up 30 tablet 0   No current facility-administered medications for this encounter.    BP (!) 172/95 (BP Location: Right Arm, Patient Position: Sitting, Cuff Size: Normal)    Pulse 86   Wt 198 lb (89.8 kg)   SpO2 100%   BMI 30.11 kg/m  General: NAD Neck: No JVD, no thyromegaly or thyroid nodule.  Lungs: Clear to auscultation bilaterally with normal respiratory effort. CV: Nondisplaced PMI.  Heart regular S1/S2, no S3/S4, no murmur.  No peripheral edema.  No carotid bruit.  Normal pedal pulses.  Abdomen: Soft, nontender, no hepatosplenomegaly, no distention.  Skin: Intact without lesions or rashes.  Neurologic: Alert and oriented x 3.  Psych: Normal affect. Extremities: No clubbing or cyanosis.  HEENT: Normal.   Assessment/Plan: 1. PE: Patient developed a PE with no obvious trigger. His father had a DVT but it seems to have happened after a leg injury. He is a Factor V Leiden heterozygote. - Given spontaneous DVT with Factor V Leiden heterozygosity, would be reasonable to continue apixaban 2.5 mg bid long-term.   2. Chronic systolic CHF: Suspect nonischemic cardiomyopathy.  Heavy ETOH use may have contributed to cardiomyopathy.  However, he does have a strong family history of CMP (brother, mother and members of mother's family).  This may be a familial cardiomyopathy. ECHO 03/22/14 EF 20-25%.  Cardiac MRI in 1/16 showed LV EF 34%, RV EF 30%.  There was subtle mid-wall anterior LGE.  This did not appear to be a CAD pattern. NYHA class I symptoms currently. Repeat echo in 4/16 showed EF improved considerably to 50-55%, suggesting that heavy ETOH may have played a large role with improvement after cutting back.  Most recent echo in 12/17 with EF 55-60%.  - He will continue to use Lasix only prn.   - Continue current Coreg, spironolactone, and Entresto.  BMET today, would like to see him get BMETs every 3-4 months on this medication regimen.  - His son and daughter should have echoes given concern for familial cardiomyopathy.  3. Ureteral dilation: Noted on CT while in hospital.  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  4. ETOH: I encouraged him to keep ETOH to a minimum.    5. Elevated triglycerides: Improved on most recent lipid panel.  Followup in 1 year   Matthew Green 04/01/2017

## 2017-04-05 ENCOUNTER — Other Ambulatory Visit (HOSPITAL_COMMUNITY): Payer: Self-pay | Admitting: Cardiology

## 2017-04-05 DIAGNOSIS — I5022 Chronic systolic (congestive) heart failure: Secondary | ICD-10-CM

## 2017-05-05 ENCOUNTER — Ambulatory Visit (INDEPENDENT_AMBULATORY_CARE_PROVIDER_SITE_OTHER): Payer: BLUE CROSS/BLUE SHIELD | Admitting: Family Medicine

## 2017-05-05 ENCOUNTER — Encounter: Payer: Self-pay | Admitting: Family Medicine

## 2017-05-05 ENCOUNTER — Other Ambulatory Visit: Payer: Self-pay

## 2017-05-05 ENCOUNTER — Other Ambulatory Visit: Payer: BLUE CROSS/BLUE SHIELD

## 2017-05-05 ENCOUNTER — Encounter: Payer: Self-pay | Admitting: General Practice

## 2017-05-05 VITALS — BP 124/80 | HR 78 | Temp 98.1°F | Resp 16 | Ht 68.0 in | Wt 193.2 lb

## 2017-05-05 DIAGNOSIS — Z0000 Encounter for general adult medical examination without abnormal findings: Secondary | ICD-10-CM

## 2017-05-05 DIAGNOSIS — Z125 Encounter for screening for malignant neoplasm of prostate: Secondary | ICD-10-CM

## 2017-05-05 DIAGNOSIS — E119 Type 2 diabetes mellitus without complications: Secondary | ICD-10-CM | POA: Diagnosis not present

## 2017-05-05 LAB — CBC WITH DIFFERENTIAL/PLATELET
BASOS PCT: 0.6 %
Basophils Absolute: 38 cells/uL (ref 0–200)
EOS ABS: 170 {cells}/uL (ref 15–500)
EOS PCT: 2.7 %
HCT: 44.1 % (ref 38.5–50.0)
Hemoglobin: 15.8 g/dL (ref 13.2–17.1)
Lymphs Abs: 1890 cells/uL (ref 850–3900)
MCH: 33.6 pg — ABNORMAL HIGH (ref 27.0–33.0)
MCHC: 35.8 g/dL (ref 32.0–36.0)
MCV: 93.8 fL (ref 80.0–100.0)
MONOS PCT: 6.5 %
MPV: 10.6 fL (ref 7.5–12.5)
NEUTROS ABS: 3793 {cells}/uL (ref 1500–7800)
Neutrophils Relative %: 60.2 %
Platelets: 207 10*3/uL (ref 140–400)
RBC: 4.7 10*6/uL (ref 4.20–5.80)
RDW: 11.7 % (ref 11.0–15.0)
Total Lymphocyte: 30 %
WBC mixed population: 410 cells/uL (ref 200–950)
WBC: 6.3 10*3/uL (ref 3.8–10.8)

## 2017-05-05 LAB — HEPATIC FUNCTION PANEL
ALBUMIN: 4.5 g/dL (ref 3.5–5.2)
ALK PHOS: 39 U/L (ref 39–117)
ALT: 31 U/L (ref 0–53)
AST: 25 U/L (ref 0–37)
Bilirubin, Direct: 0.1 mg/dL (ref 0.0–0.3)
Total Bilirubin: 0.7 mg/dL (ref 0.2–1.2)
Total Protein: 7.2 g/dL (ref 6.0–8.3)

## 2017-05-05 LAB — LIPID PANEL
CHOL/HDL RATIO: 7
Cholesterol: 180 mg/dL (ref 0–200)
HDL: 26.8 mg/dL — ABNORMAL LOW (ref >39.00)
NONHDL: 153.51
TRIGLYCERIDES: 265 mg/dL — AB (ref 0.0–149.0)
VLDL: 53 mg/dL — ABNORMAL HIGH (ref 0.0–40.0)

## 2017-05-05 LAB — HEMOGLOBIN A1C: Hgb A1c MFr Bld: 8.3 % — ABNORMAL HIGH (ref 4.6–6.5)

## 2017-05-05 LAB — BASIC METABOLIC PANEL
BUN: 14 mg/dL (ref 6–23)
CALCIUM: 9.5 mg/dL (ref 8.4–10.5)
CO2: 28 mEq/L (ref 19–32)
Chloride: 99 mEq/L (ref 96–112)
Creatinine, Ser: 1.04 mg/dL (ref 0.40–1.50)
GFR: 79.21 mL/min (ref >60.00)
Glucose, Bld: 203 mg/dL — ABNORMAL HIGH (ref 70–99)
POTASSIUM: 4.3 meq/L (ref 3.5–5.1)
SODIUM: 133 meq/L — AB (ref 135–145)

## 2017-05-05 LAB — LDL CHOLESTEROL, DIRECT: LDL DIRECT: 114 mg/dL

## 2017-05-05 LAB — TSH: TSH: 1.06 u[IU]/mL (ref 0.35–4.50)

## 2017-05-05 LAB — PSA: PSA: 0.48 ng/mL (ref 0.10–4.00)

## 2017-05-05 NOTE — Assessment & Plan Note (Signed)
Chronic problem.  currently diet controlled.  UTD on eye exam, foot exam.  On ARB for renal protection.  Check labs.  Adjust tx prn

## 2017-05-05 NOTE — Progress Notes (Signed)
   Subjective:    Patient ID: Matthew Green, male    DOB: 07-30-1965, 52 y.o.   MRN: [id number]  HPI CPE- UTD on cologuard.  Due for flu shot.  No concerns today, 'I feel great!'   Review of Systems Patient reports no vision/hearing changes, anorexia, fever ,adenopathy, persistant/recurrent hoarseness, swallowing issues, chest pain, palpitations, edema, persistant/recurrent cough, hemoptysis, dyspnea (rest,exertional, paroxysmal nocturnal), gastrointestinal  bleeding (melena, rectal bleeding), abdominal pain, excessive heart burn, GU symptoms (dysuria, hematuria, voiding/incontinence issues) syncope, focal weakness, memory loss, numbness & tingling, skin/hair/nail changes, depression, anxiety, abnormal bruising/bleeding, musculoskeletal symptoms/signs.     Objective:   Physical Exam BP 124/80   Pulse 78   Temp 98.1 F (36.7 C) (Oral)   Resp 16   Ht 5\' 8"  (1.727 m)   Wt 193 lb 4 oz (87.7 kg)   SpO2 98%   BMI 29.38 kg/m   General Appearance:    Alert, cooperative, no distress, appears stated age  Head:    Normocephalic, without obvious abnormality, atraumatic  Eyes:    PERRL, conjunctiva/corneas clear, EOM's intact, fundi    benign, both eyes       Ears:    Normal TM's and external ear canals, both ears  Nose:   Nares normal, septum midline, mucosa normal, no drainage   or sinus tenderness  Throat:   Lips, mucosa, and tongue normal; teeth and gums normal  Neck:   Supple, symmetrical, trachea midline, no adenopathy;       thyroid:  No enlargement/tenderness/nodules  Back:     Symmetric, no curvature, ROM normal, no CVA tenderness  Lungs:     Clear to auscultation bilaterally, respirations unlabored  Chest wall:    No tenderness or deformity  Heart:    Regular rate and rhythm, S1 and S2 normal, no murmur, rub   or gallop  Abdomen:     Soft, non-tender, bowel sounds active all four quadrants,    no masses, no organomegaly  Genitalia:    Normal male without lesion, discharge or  tenderness  Rectal:    Normal tone, normal prostate, no masses or tenderness  Extremities:   Extremities normal, atraumatic, no cyanosis or edema  Pulses:   2+ and symmetric all extremities  Skin:   Skin color, texture, turgor normal, no rashes or lesions  Lymph nodes:   Cervical, supraclavicular, and axillary nodes normal  Neurologic:   CNII-XII intact. Normal strength, sensation and reflexes      throughout          Assessment & Plan:

## 2017-05-05 NOTE — Patient Instructions (Signed)
Follow up in 3-4 months to recheck sugars We'll notify you of your lab results and make any changes if needed Keep up the good work on healthy diet and regular exercise- you look great! Call and schedule your eye exam Call with any questions or concerns Enjoy your concert!!!

## 2017-05-08 ENCOUNTER — Encounter: Payer: Self-pay | Admitting: Family Medicine

## 2017-05-22 ENCOUNTER — Other Ambulatory Visit: Payer: Self-pay | Admitting: General Practice

## 2017-05-22 MED ORDER — FENOFIBRATE 160 MG PO TABS: 160.0000 mg | ORAL_TABLET | Freq: Every day | ORAL | 1 refills | Status: DC

## 2017-06-05 ENCOUNTER — Other Ambulatory Visit (HOSPITAL_COMMUNITY): Payer: Self-pay | Admitting: Cardiology

## 2017-06-23 DIAGNOSIS — L578 Other skin changes due to chronic exposure to nonionizing radiation: Secondary | ICD-10-CM | POA: Diagnosis not present

## 2017-06-23 DIAGNOSIS — L570 Actinic keratosis: Secondary | ICD-10-CM | POA: Diagnosis not present

## 2017-06-23 DIAGNOSIS — D225 Melanocytic nevi of trunk: Secondary | ICD-10-CM | POA: Diagnosis not present

## 2017-06-23 DIAGNOSIS — L821 Other seborrheic keratosis: Secondary | ICD-10-CM | POA: Diagnosis not present

## 2017-06-23 DIAGNOSIS — D1801 Hemangioma of skin and subcutaneous tissue: Secondary | ICD-10-CM | POA: Diagnosis not present

## 2017-09-01 ENCOUNTER — Encounter: Payer: Self-pay | Admitting: General Practice

## 2017-09-01 ENCOUNTER — Encounter: Payer: Self-pay | Admitting: Family Medicine

## 2017-09-01 ENCOUNTER — Ambulatory Visit: Payer: BLUE CROSS/BLUE SHIELD | Admitting: Family Medicine

## 2017-09-01 ENCOUNTER — Other Ambulatory Visit: Payer: Self-pay

## 2017-09-01 VITALS — BP 128/82 | HR 75 | Temp 98.1°F | Resp 16 | Ht 68.0 in | Wt 190.6 lb

## 2017-09-01 DIAGNOSIS — E119 Type 2 diabetes mellitus without complications: Secondary | ICD-10-CM | POA: Diagnosis not present

## 2017-09-01 LAB — BASIC METABOLIC PANEL
BUN: 15 mg/dL (ref 6–23)
CHLORIDE: 100 meq/L (ref 96–112)
CO2: 26 mEq/L (ref 19–32)
Calcium: 9.6 mg/dL (ref 8.4–10.5)
Creatinine, Ser: 1 mg/dL (ref 0.40–1.50)
GFR: 82.78 mL/min (ref >60.00)
Glucose, Bld: 185 mg/dL — ABNORMAL HIGH (ref 70–99)
POTASSIUM: 4.3 meq/L (ref 3.5–5.1)
Sodium: 136 mEq/L (ref 135–145)

## 2017-09-01 LAB — HEMOGLOBIN A1C: Hgb A1c MFr Bld: 7.9 % — ABNORMAL HIGH (ref 4.6–6.5)

## 2017-09-01 NOTE — Assessment & Plan Note (Signed)
Chronic problem.  Attempting to control w/ diet and exercise.  UTD on foot exam, eye exam, on ARB for renal protection.  Pt has limited his carb intake and increased his exercise.  Check labs and start meds prn.

## 2017-09-01 NOTE — Progress Notes (Signed)
   Subjective:    Patient ID: Matthew Green, male    DOB: June 12, 1965, 52 y.o.   MRN: [id number]  HPI DM- chronic problem, attempting to control w/ diet and exercise.  Last A1C 8.3.  Pt declined to start meds at that time.  UTD on eye exam, foot exam.  On ARB for renal protection.  Down 3 lbs from Feb.  Exercising regularly.  Pt has been working on low carb diet.  No CP, SOB, HAs, visual changes, abd pain, N/V, numbness/tingling of hands/feet.   Review of Systems For ROS see HPI     Objective:   Physical Exam  Constitutional: He is oriented to person, place, and time. He appears well-developed and well-nourished. No distress.  HENT:  Head: Normocephalic and atraumatic.  Eyes: Pupils are equal, round, and reactive to light. Conjunctivae and EOM are normal.  Neck: Normal range of motion. Neck supple. No thyromegaly present.  Cardiovascular: Normal rate, regular rhythm, normal heart sounds and intact distal pulses.  No murmur heard. Pulmonary/Chest: Effort normal and breath sounds normal. No respiratory distress.  Abdominal: Soft. Bowel sounds are normal. He exhibits no distension.  Musculoskeletal: He exhibits no edema.  Lymphadenopathy:    He has no cervical adenopathy.  Neurological: He is alert and oriented to person, place, and time. No cranial nerve deficit.  Skin: Skin is warm and dry.  Psychiatric: He has a normal mood and affect. His behavior is normal.  Vitals reviewed.         Assessment & Plan:

## 2017-09-01 NOTE — Patient Instructions (Signed)
Follow up in 3-4 months to recheck sugar and cholesterol We'll notify you of your lab results and make any changes if needed Keep up the good work on healthy diet and regular exercise Call with any questions or concerns Have a great summer!!

## 2017-09-04 ENCOUNTER — Encounter: Payer: Self-pay | Admitting: Family Medicine

## 2017-09-04 ENCOUNTER — Other Ambulatory Visit: Payer: Self-pay | Admitting: Family Medicine

## 2017-09-04 DIAGNOSIS — E119 Type 2 diabetes mellitus without complications: Secondary | ICD-10-CM

## 2017-09-04 MED ORDER — ONETOUCH ULTRA MINI W/DEVICE KIT: PACK | 0 refills | Status: DC

## 2017-09-04 MED ORDER — CONTOUR NEXT MONITOR W/DEVICE KIT: 1.0000 [IU] | PACK | Freq: Every day | 0 refills | Status: AC

## 2017-09-04 MED ORDER — GLUCOSE BLOOD VI STRP
ORAL_STRIP | 12 refills | Status: DC
Start: 2017-09-04 — End: 2017-09-04

## 2017-09-04 MED ORDER — GLUCOSE BLOOD VI STRP
ORAL_STRIP | 12 refills | Status: DC
Start: 2017-09-04 — End: 2018-09-06

## 2017-11-16 ENCOUNTER — Other Ambulatory Visit: Payer: Self-pay | Admitting: Family Medicine

## 2017-11-20 DIAGNOSIS — J301 Allergic rhinitis due to pollen: Secondary | ICD-10-CM | POA: Diagnosis not present

## 2017-11-20 DIAGNOSIS — J3089 Other allergic rhinitis: Secondary | ICD-10-CM | POA: Diagnosis not present

## 2017-11-20 DIAGNOSIS — J3081 Allergic rhinitis due to animal (cat) (dog) hair and dander: Secondary | ICD-10-CM | POA: Diagnosis not present

## 2017-11-20 DIAGNOSIS — T781XXD Other adverse food reactions, not elsewhere classified, subsequent encounter: Secondary | ICD-10-CM | POA: Diagnosis not present

## 2017-11-20 DIAGNOSIS — L509 Urticaria, unspecified: Secondary | ICD-10-CM | POA: Diagnosis not present

## 2017-12-22 ENCOUNTER — Other Ambulatory Visit (HOSPITAL_COMMUNITY): Payer: Self-pay | Admitting: *Deleted

## 2017-12-22 ENCOUNTER — Encounter: Payer: Self-pay | Admitting: Family Medicine

## 2017-12-22 ENCOUNTER — Other Ambulatory Visit: Payer: Self-pay

## 2017-12-22 ENCOUNTER — Ambulatory Visit: Payer: BLUE CROSS/BLUE SHIELD | Admitting: Family Medicine

## 2017-12-22 VITALS — BP 128/82 | HR 78 | Temp 98.1°F | Resp 16 | Ht 68.0 in | Wt 189.5 lb

## 2017-12-22 DIAGNOSIS — E041 Nontoxic single thyroid nodule: Secondary | ICD-10-CM

## 2017-12-22 DIAGNOSIS — E781 Pure hyperglyceridemia: Secondary | ICD-10-CM | POA: Diagnosis not present

## 2017-12-22 DIAGNOSIS — I10 Essential (primary) hypertension: Secondary | ICD-10-CM | POA: Diagnosis not present

## 2017-12-22 DIAGNOSIS — Z23 Encounter for immunization: Secondary | ICD-10-CM | POA: Diagnosis not present

## 2017-12-22 DIAGNOSIS — E119 Type 2 diabetes mellitus without complications: Secondary | ICD-10-CM

## 2017-12-22 DIAGNOSIS — I5022 Chronic systolic (congestive) heart failure: Secondary | ICD-10-CM

## 2017-12-22 LAB — BASIC METABOLIC PANEL
BUN: 15 mg/dL (ref 6–23)
CALCIUM: 9.5 mg/dL (ref 8.4–10.5)
CO2: 28 meq/L (ref 19–32)
Chloride: 101 mEq/L (ref 96–112)
Creatinine, Ser: 1.05 mg/dL (ref 0.40–1.50)
GFR: 78.16 mL/min (ref >60.00)
GLUCOSE: 182 mg/dL — AB (ref 70–99)
Potassium: 4.2 mEq/L (ref 3.5–5.1)
SODIUM: 136 meq/L (ref 135–145)

## 2017-12-22 LAB — CBC WITH DIFFERENTIAL/PLATELET
BASOS ABS: 0 10*3/uL (ref 0.0–0.1)
BASOS PCT: 0.6 % (ref 0.0–3.0)
Eosinophils Absolute: 0.2 10*3/uL (ref 0.0–0.7)
Eosinophils Relative: 2.5 % (ref 0.0–5.0)
HCT: 43.5 % (ref 39.0–52.0)
Hemoglobin: 15.1 g/dL (ref 13.0–17.0)
LYMPHS PCT: 38.4 % (ref 12.0–46.0)
Lymphs Abs: 2.4 10*3/uL (ref 0.7–4.0)
MCHC: 34.7 g/dL (ref 30.0–36.0)
MCV: 93.1 fl (ref 78.0–100.0)
MONO ABS: 0.3 10*3/uL (ref 0.1–1.0)
Monocytes Relative: 4.8 % (ref 3.0–12.0)
NEUTROS ABS: 3.4 10*3/uL (ref 1.4–7.7)
NEUTROS PCT: 53.7 % (ref 43.0–77.0)
PLATELETS: 182 10*3/uL (ref 150.0–400.0)
RBC: 4.67 Mil/uL (ref 4.22–5.81)
RDW: 12.2 % (ref 11.5–15.5)
WBC: 6.3 10*3/uL (ref 4.0–10.5)

## 2017-12-22 LAB — HEPATIC FUNCTION PANEL
ALK PHOS: 37 U/L — AB (ref 39–117)
ALT: 38 U/L (ref 0–53)
AST: 27 U/L (ref 0–37)
Albumin: 4.5 g/dL (ref 3.5–5.2)
BILIRUBIN DIRECT: 0.1 mg/dL (ref 0.0–0.3)
TOTAL PROTEIN: 7.1 g/dL (ref 6.0–8.3)
Total Bilirubin: 0.5 mg/dL (ref 0.2–1.2)

## 2017-12-22 LAB — LDL CHOLESTEROL, DIRECT: Direct LDL: 85 mg/dL

## 2017-12-22 LAB — LIPID PANEL
Cholesterol: 161 mg/dL (ref 0–200)
HDL: 25.8 mg/dL — AB (ref >39.00)
NonHDL: 135.45
Total CHOL/HDL Ratio: 6
Triglycerides: 342 mg/dL — ABNORMAL HIGH (ref 0.0–149.0)
VLDL: 68.4 mg/dL — ABNORMAL HIGH (ref 0.0–40.0)

## 2017-12-22 LAB — TSH: TSH: 0.84 u[IU]/mL (ref 0.35–4.50)

## 2017-12-22 LAB — HEMOGLOBIN A1C: HEMOGLOBIN A1C: 7.7 % — AB (ref 4.6–6.5)

## 2017-12-22 MED ORDER — APIXABAN 2.5 MG PO TABS
2.5000 mg | ORAL_TABLET | Freq: Two times a day (BID) | ORAL | 6 refills | Status: DC
Start: 2017-12-22 — End: 2018-07-12

## 2017-12-22 NOTE — Assessment & Plan Note (Signed)
Chronic problem.  Currently diet controlled.  UTD on foot exam, eye exam, and on ARB for renal protection.  Stressed need for low carb diet and regular exercise.  Check labs and start meds prn.

## 2017-12-22 NOTE — Patient Instructions (Signed)
Schedule your complete physical in 6 months We'll notify you of your lab results and make any changes if needed We'll call you with your thyroid ultrasound appt Continue to work on healthy diet and regular exercise- you can do it! Call with any questions or concerns Happy Fall!!!

## 2017-12-22 NOTE — Assessment & Plan Note (Signed)
Chronic problem.  Tolerating fenofibrate w/o difficulty.  Encouraged healthy diet and regular exercise.  Check labs.  Adjust meds prn

## 2017-12-22 NOTE — Assessment & Plan Note (Signed)
Chronic problem.  Adequate control.  Asymptomatic.  Check labs.  No anticipated med changes.  Will follow. 

## 2017-12-22 NOTE — Progress Notes (Signed)
   Subjective:    Patient ID: Matthew Green, male    DOB: 1965-04-25, 52 y.o.   MRN: [id number]  HPI HTN- chronic problem, on Coreg 12.5mg  BID, Entresto BID, spironolactone 25mg  daily.  'I feel excellent'.  No CP, SOB, HAs, visual changes, edema.  DM- chronic problem, attempting to control w/ diet and exercise.  UTD on eye exam, foot exam.  On ARB for renal protection.  Not as much exercise due to heat but very active.  No numbness/tingling of hands/feet.  CBGs range from 90s-130s.  Hyperlipidemia- chronic problem, on Fenofibrate daily.  No abd pain, N/V.  Review of Systems For ROS see HPI     Objective:   Physical Exam  Constitutional: He is oriented to person, place, and time. He appears well-developed and well-nourished. No distress.  HENT:  Head: Normocephalic and atraumatic.  Eyes: Pupils are equal, round, and reactive to light. Conjunctivae and EOM are normal.  Neck: Normal range of motion. Neck supple. Thyromegaly (L thyroid nodule) present.  Cardiovascular: Normal rate, regular rhythm, normal heart sounds and intact distal pulses.  No murmur heard. Pulmonary/Chest: Effort normal and breath sounds normal. No respiratory distress.  Abdominal: Soft. Bowel sounds are normal. He exhibits no distension.  Musculoskeletal: He exhibits no edema.  Lymphadenopathy:    He has no cervical adenopathy.  Neurological: He is alert and oriented to person, place, and time. No cranial nerve deficit.  Skin: Skin is warm and dry.  Psychiatric: He has a normal mood and affect. His behavior is normal.  Vitals reviewed.         Assessment & Plan:  ? Thyroid nodule- new.  L sided just above clavicle.  Will get [place] and labs to assess.

## 2017-12-25 ENCOUNTER — Encounter: Payer: Self-pay | Admitting: Family Medicine

## 2017-12-27 ENCOUNTER — Ambulatory Visit
Admission: RE | Admit: 2017-12-27 | Discharge: 2017-12-27 | Disposition: A | Discharge status: Home / Self Care | Payer: BLUE CROSS/BLUE SHIELD | Source: Ambulatory Visit | Attending: Family Medicine | Admitting: Family Medicine

## 2017-12-27 DIAGNOSIS — E041 Nontoxic single thyroid nodule: Secondary | ICD-10-CM

## 2018-01-27 ENCOUNTER — Other Ambulatory Visit (HOSPITAL_COMMUNITY): Payer: Self-pay | Admitting: Cardiology

## 2018-01-31 ENCOUNTER — Other Ambulatory Visit (HOSPITAL_COMMUNITY): Payer: Self-pay | Admitting: Cardiology

## 2018-03-13 ENCOUNTER — Other Ambulatory Visit (HOSPITAL_COMMUNITY): Payer: Self-pay | Admitting: Cardiology

## 2018-04-29 ENCOUNTER — Other Ambulatory Visit (HOSPITAL_COMMUNITY): Payer: Self-pay | Admitting: Cardiology

## 2018-04-30 ENCOUNTER — Other Ambulatory Visit (HOSPITAL_COMMUNITY): Payer: Self-pay

## 2018-04-30 MED ORDER — SPIRONOLACTONE 25 MG PO TABS: 25.0000 mg | ORAL_TABLET | Freq: Every day | ORAL | 1 refills | Status: DC

## 2018-05-16 ENCOUNTER — Other Ambulatory Visit: Payer: Self-pay | Admitting: Family Medicine

## 2018-05-24 ENCOUNTER — Other Ambulatory Visit (HOSPITAL_COMMUNITY): Payer: Self-pay | Admitting: Cardiology

## 2018-05-26 ENCOUNTER — Other Ambulatory Visit (HOSPITAL_COMMUNITY): Payer: Self-pay | Admitting: Cardiology

## 2018-06-22 ENCOUNTER — Other Ambulatory Visit (HOSPITAL_COMMUNITY): Payer: Self-pay | Admitting: Cardiology

## 2018-06-29 ENCOUNTER — Other Ambulatory Visit: Payer: Self-pay

## 2018-06-29 ENCOUNTER — Encounter: Payer: BLUE CROSS/BLUE SHIELD | Admitting: Family Medicine

## 2018-06-29 ENCOUNTER — Telehealth (HOSPITAL_COMMUNITY): Payer: Self-pay

## 2018-06-29 ENCOUNTER — Ambulatory Visit (HOSPITAL_COMMUNITY)
Admission: RE | Admit: 2018-06-29 | Discharge: 2018-06-29 | Disposition: A | Discharge status: Home / Self Care | Payer: BLUE CROSS/BLUE SHIELD | Source: Ambulatory Visit | Attending: Cardiology | Admitting: Cardiology

## 2018-06-29 DIAGNOSIS — L570 Actinic keratosis: Secondary | ICD-10-CM | POA: Diagnosis not present

## 2018-06-29 DIAGNOSIS — L812 Freckles: Secondary | ICD-10-CM | POA: Diagnosis not present

## 2018-06-29 DIAGNOSIS — D225 Melanocytic nevi of trunk: Secondary | ICD-10-CM | POA: Diagnosis not present

## 2018-06-29 DIAGNOSIS — I5022 Chronic systolic (congestive) heart failure: Secondary | ICD-10-CM

## 2018-06-29 DIAGNOSIS — L578 Other skin changes due to chronic exposure to nonionizing radiation: Secondary | ICD-10-CM | POA: Diagnosis not present

## 2018-06-29 NOTE — Telephone Encounter (Signed)
CBC/BMET/ECHO on 6/3 930 & 10am  1 year f/u-recall  Pt verbalized understanding and denied any further questions

## 2018-06-29 NOTE — Patient Instructions (Signed)
CBC/BMET/ECHO on 6/3 930 & 10am  1 year f/u-recall

## 2018-07-01 NOTE — Progress Notes (Signed)
Heart Failure TeleHealth Note  Due to national recommendations of social distancing due to Axis 19, Audio/video telehealth visit is felt to be most appropriate for this patient at this time.  See MyChart message from today for patient consent regarding telehealth for [facility].  Date:  07/01/2018   ID:  Matthew Green, DOB 1965/11/13, MRN [id number]  Location: Home  Provider location: [address], [place] Type of Visit: Established patient  PCP:  Midge Minium, MD  Cardiologist: Dr. Aundra Dubin  Chief Complaint: Cardiomyopathy   History of Present Illness: Matthew Green is a 53 y.o. male who presents via audio/video conferencing for a telehealth visit today.     Today, he denies symptoms of cough, fevers, chills, or new SOB worrisome for COVID 19.    Patient with history of tobacco use and heavy ETOH use developed exertional dyspnea and orthopnea about 3 weeks prior to admission to [facility] in 12/15.  No chest pain.  He was admitted 03/21/14 and found to have a PE.  Lower extremity US showed no DVT.  He had no known trigger: no surgery or prolonged immobility.  His father had a DVT after a leg injury.  As part of the workup, he had an echo showing EF 20-25% with diffuse hypokinesis.  He was noted to be volume overloaded and was diuresed.  He was started on apixaban.  His brother and multiple family members on his mother's side have a history of cardiomyopathy.  Of note, he has been a heavy drinker, up to 12 beers/day, since his divorce.  He was also a heavy smoker until around the time of his CHF admission, when he quit.  Echo was repeated in 4/16, showing EF 50-55% with trace MR.    He is doing well overall.  Working full time, active at work.  No exertional dyspnea.  No chest pain.  No orthopnea/PND.  Tolerating apixaban with no BRBPR/melena.  Brother was recently admitted with DVT.   Labs  (03/25/14): K 4.6, creatinine 1.29, HCT 43.5, BNP 987, TnI 0.05,  AST/ALT normal, LDL 64, TSH normal, lupus anticoagulant negative, beta-2 glycoprotein negative, anticardiolipin antibody negative. Factor V Leiden homozygote.  Negative for prothrombin gene mutation.  (03/27/14): Dig level 0.7  (1/16): K 4.2, creatinine 0.9, LFTs normal (3/16): K 3.7, creatinine 1.02, BNP 82 (4/16): K 4.2, creatinine 0.92, digoxin 0.3 (8/17): K 5, creatinine 0.87, LDL 63, TGs 484, HDL 35 (8/18): K 4.2, creatinine 1.05, hgb 14.7, TGs 187, LDL 97, HDL 29 (9/19): K 4.2, creatinine 1.05, LDL 85, TGs 342  PMH: 1. PE: Diagnosed by CTA in 12/15. Lower extremity US was negative.  There was no trigger.  Lupus anticoagulant negative, beta-2 glycoprotein negative, anticardiolipin antibody negative, prothrombin gene mutation negative.  Factor V Leiden heterozygote.  2. Cardiomyopathy: Echo (12/15) with EF 20-25%, mildly dilated LV with diffuse hypokinesis, RV mildly dilated with moderately decreased systolic function, PA systolic pressure 44 mmHg. Cardiac MRI (1/16) with LV EF 34%, global hypokinesis; RV moderately hypokinetic, EF 30%, possible subtle mid-wall LGE in the anterior wall (not a coronary pattern).  Echo (4/16) with EF 50-55%, trace MR.  - Echo (12/17): EF 55-60%, normal RV.  3. 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 .   Current Outpatient Medications  Medication Sig Dispense Refill  . apixaban (ELIQUIS) 2.5 MG TABS tablet Take 1 tablet (2.5 mg total) by mouth 2 (two) times daily.  60 tablet 6  . Blood Glucose Monitoring Suppl (CONTOUR NEXT MONITOR) w/Device KIT 1 Units by Does not apply route daily. Check blood sugars once daily Dx:E11.9 1 kit 0  . carvedilol (COREG) 12.5 MG tablet TAKE 1 TABLET (12.5 MG TOTAL) BY MOUTH 2 (TWO) TIMES DAILY WITH A MEAL. 180 tablet 3  . ENTRESTO 24-26 MG TAKE 1 TABLET BY MOUTH TWICE A DAY 60 tablet 3  . EPINEPHrine 0.3 mg/0.3 mL IJ SOAJ injection USE AS DIRECTED ON PACKAGE AS NEEDED FOR  ALLERGIC REACTION  0  . fenofibrate 160 MG tablet TAKE 1 TABLET BY MOUTH EVERY DAY 90 tablet 1  . glucose blood (CONTOUR NEXT TEST) test strip Check blood sugar once daily Dx: E11.9 100 each 12  . spironolactone (ALDACTONE) 25 MG tablet TAKE 1 TABLET BY MOUTH EVERY DAY 90 tablet 0  . spironolactone (ALDACTONE) 25 MG tablet TAKE 1 TABLET (25 MG TOTAL) BY MOUTH DAILY. NEEDS FOLLOW UP 90 tablet 3  . triamcinolone cream (KENALOG) 0.1 % APPLY TO AFFECTED AREA TWICE A DAY  2   No current facility-administered medications for this encounter.     Allergies:   Other   Social History:  The patient  reports that he quit smoking about 4 years ago. He has never used smokeless tobacco. He reports that he does not drink alcohol or use drugs.   Family History:  The patient's family history includes Deep vein thrombosis in his father; Heart failure in his brother and mother.   ROS:  Please see the history of present illness.   All other systems are personally reviewed and negative.   Exam:  (Video/Tele Health Call; Exam is subjective and or/visual.) BP 125/70 (obtained by patient) General:  Well appearing. No resp difficulty. Lungs: Normal respiratory effort with conversation.  Abdomen: Non-distended. Pt denies tenderness with self palpation.  Extremities: Pt denies edema. Neuro: Alert & oriented x 3.   Recent Labs: 12/22/2017: ALT 38; BUN 15; Creatinine, Ser 1.05; Hemoglobin 15.1; Platelets 182.0; Potassium 4.2; Sodium 136; TSH 0.84  Personally reviewed   Wt Readings from Last 3 Encounters:  12/22/17 86 kg (189 lb 8 oz)  09/01/17 86.5 kg (190 lb 9.6 oz)  05/05/17 87.7 kg (193 lb 4 oz)     ASSESSMENT AND PLAN:  1. PE: Patient developed a PE with no obvious trigger. His father had a DVT but it seems to have happened after a leg injury; brother had DVT. He is a Factor V Leiden heterozygote. - Given spontaneous DVT with Factor V Leiden heterozygosity, would be reasonable to continue apixaban 2.5 mg  bid long-term given no problems with bleeding.    2. Chronic systolic CHF: Suspect nonischemic cardiomyopathy.  Heavy ETOH use may have contributed to cardiomyopathy.  However, he does have a strong family history of CMP (brother, mother and members of mother's family).  This may be a familial cardiomyopathy. ECHO 03/22/14 EF 20-25%.  Cardiac MRI in 1/16 showed LV EF 34%, RV EF 30%.  There was subtle mid-wall anterior LGE.  This did not appear to be a CAD pattern. NYHA class I symptoms currently. Repeat echo in 4/16 showed EF improved considerably to 50-55%, suggesting that heavy ETOH may have played a large role with improvement after cutting back.  Most recent echo in 12/17 with EF 55-60%.  - He will continue to use Lasix only prn.   - Continue current Coreg, spironolactone, and Entresto.  I will arrange for BMET, would like to see him  get BMETs every 3-4 months on this medication regimen.  - I am going to arrange for an echocardiogram to make sure that his EF remains back in the normal range.  3. Ureteral dilation: Noted on CT while in hospital.  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  4. ETOH: I encouraged him to keep ETOH to a minimum.   5. Elevated triglycerides: Continue fenofibrate.  COVID screen The patient does not have any symptoms that suggest any further testing/ screening at this time.  Social distancing reinforced today.  Recommended follow-up:  1 year if echo shows normal EF  Relevant cardiac medications were reviewed at length with the patient today.   The patient does not have concerns regarding their medications at this time.   Patient Risk: After full review of this patients clinical status, I feel that they are at moderate risk for cardiac decompensation at this time.  Today, I have spent 22 minutes with the patient with telehealth technology discussing CHF.    Signed, [name], MD  07/01/2018  [facility] [address] [facility] [place] [address] [phone] (office) [phone] (fax)

## 2018-07-12 ENCOUNTER — Other Ambulatory Visit (HOSPITAL_COMMUNITY): Payer: Self-pay | Admitting: Cardiology

## 2018-07-12 DIAGNOSIS — I5022 Chronic systolic (congestive) heart failure: Secondary | ICD-10-CM

## 2018-07-27 ENCOUNTER — Other Ambulatory Visit: Payer: Self-pay

## 2018-07-27 ENCOUNTER — Encounter: Payer: Self-pay | Admitting: Family Medicine

## 2018-07-27 ENCOUNTER — Ambulatory Visit (INDEPENDENT_AMBULATORY_CARE_PROVIDER_SITE_OTHER): Payer: 59 | Admitting: Family Medicine

## 2018-07-27 VITALS — BP 125/79 | Ht 68.0 in | Wt 185.0 lb

## 2018-07-27 DIAGNOSIS — I10 Essential (primary) hypertension: Secondary | ICD-10-CM

## 2018-07-27 DIAGNOSIS — I5022 Chronic systolic (congestive) heart failure: Secondary | ICD-10-CM

## 2018-07-27 DIAGNOSIS — E119 Type 2 diabetes mellitus without complications: Secondary | ICD-10-CM

## 2018-07-27 DIAGNOSIS — E781 Pure hyperglyceridemia: Secondary | ICD-10-CM

## 2018-07-27 NOTE — Progress Notes (Signed)
I have discussed the procedure for the virtual visit with the patient who has given consent to proceed with assessment and treatment.   BETHANY DILLARD, CMA     

## 2018-07-27 NOTE — Progress Notes (Signed)
Virtual Visit via Video   I connected with patient on 07/27/18 at  9:00 AM EDT by a video enabled telemedicine application and verified that I am speaking with the correct person using two identifiers.  Location patient: Home Location provider: [facility], Office Persons participating in the virtual visit: Patient, Provider, [place] ([place])  I discussed the limitations of evaluation and management by telemedicine and the availability of in person appointments. The patient expressed understanding and agreed to proceed.  Subjective:   HPI:   HTN- chronic problem, on Coreg 12.40m BID, Entresto 24/238mBID, Spironolactone 2551maily w/ good control.  No CP, SOB, HAs, visual changes, edema  CHF- following w/ Cards.  Hyperlipidemia- hx of elevated triglycerides.  On Fenofibrate 160m17mily.  No abd pain, N/V  DM- chronic problem.  Attempting to control w/ diet and exercise.  On ARB.  UTD on eye exam, foot exam.  No numbness/tingling of hands/feet.  Down 4 lbs since last visit.  Pt reports increased activity level.  ROS:   See pertinent positives and negatives per HPI.  Patient Active Problem List   Diagnosis Date Noted   Hypertriglyceridemia 11/11/2016   Physical exam 02/077/93/9030hronic systolic CHF (congestive heart failure) (HCC)[place]/22/2016   ETOH abuse 04/16/2014   Right-sided heart failure (HCC)[place]/27/2015   Diabetes mellitus without complication (HCC)[place]/209/23/3007x of pulmonary embolus 03/21/2014   Smoking 03/21/2014   HTN (hypertension) 03/21/2014   Abdominal distension     Social History   Tobacco Use   Smoking status: Former Smoker    Last attempt to quit: 02/25/2014    Years since quitting: 4.4   Smokeless tobacco: Never Used  Substance Use Topics   Alcohol use: No    Current Outpatient Medications:    Blood Glucose Monitoring Suppl (CONTOUR NEXT MONITOR) w/Device KIT, 1 Units by Does not apply route daily. Check blood sugars  once daily Dx:E11.9, Disp: 1 kit, Rfl: 0   carvedilol (COREG) 12.5 MG tablet, TAKE 1 TABLET (12.5 MG TOTAL) BY MOUTH 2 (TWO) TIMES DAILY WITH A MEAL., Disp: 180 tablet, Rfl: 3   ELIQUIS 2.5 MG TABS tablet, TAKE 1 TABLET BY MOUTH TWICE A DAY, Disp: 60 tablet, Rfl: 6   ENTRESTO 24-26 MG, TAKE 1 TABLET BY MOUTH TWICE A DAY, Disp: 60 tablet, Rfl: 3   EPINEPHrine 0.3 mg/0.3 mL IJ SOAJ injection, USE AS DIRECTED ON PACKAGE AS NEEDED FOR ALLERGIC REACTION, Disp: , Rfl: 0   fenofibrate 160 MG tablet, TAKE 1 TABLET BY MOUTH EVERY DAY, Disp: 90 tablet, Rfl: 1   glucose blood (CONTOUR NEXT TEST) test strip, Check blood sugar once daily Dx: E11.9, Disp: 100 each, Rfl: 12   spironolactone (ALDACTONE) 25 MG tablet, TAKE 1 TABLET BY MOUTH EVERY DAY, Disp: 90 tablet, Rfl: 0   triamcinolone cream (KENALOG) 0.1 %, APPLY TO AFFECTED AREA TWICE A DAY, Disp: , Rfl: 2   spironolactone (ALDACTONE) 25 MG tablet, TAKE 1 TABLET (25 MG TOTAL) BY MOUTH DAILY. NEEDS FOLLOW UP, Disp: 90 tablet, Rfl: 3  Allergies  Allergen Reactions   Other     Allergic to Red Meat    Objective:   BP 125/79    Ht _0  (1.727 m)    Wt 185 lb (83.9 kg)    BMI 28.13 kg/m   AAOx3, NAD NCAT, EOMI No obvious CN deficits Coloring WNL Pt is able to speak clearly, coherently without shortness of breath or increased work of  breathing.  Thought process is linear.  Mood is appropriate.   Assessment and Plan:   DM- chronic problem.  Currently diet controlled.  Previously on Metformin.  UTD on foot exam and eye exam.  On ARB for renal protection.  Check labs and adjust tx prn.  HTN- chronic problem.  Well controlled.  Asymptomatic.  Check labs.  No anticipated med changes  CHF- following w/ Cards  Hypertriglyceridemia- tolerating Fenofibrate w/o difficulty.  Check labs.  Adjust meds prn    Annye Asa, MD 07/27/2018

## 2018-08-08 ENCOUNTER — Other Ambulatory Visit (INDEPENDENT_AMBULATORY_CARE_PROVIDER_SITE_OTHER): Payer: 59

## 2018-08-08 DIAGNOSIS — I10 Essential (primary) hypertension: Secondary | ICD-10-CM | POA: Diagnosis not present

## 2018-08-08 DIAGNOSIS — E781 Pure hyperglyceridemia: Secondary | ICD-10-CM

## 2018-08-08 DIAGNOSIS — E119 Type 2 diabetes mellitus without complications: Secondary | ICD-10-CM

## 2018-08-08 LAB — CBC WITH DIFFERENTIAL/PLATELET
Basophils Absolute: 0 10*3/uL (ref 0.0–0.1)
Basophils Relative: 0.5 % (ref 0.0–3.0)
Eosinophils Absolute: 0.2 10*3/uL (ref 0.0–0.7)
Eosinophils Relative: 3.6 % (ref 0.0–5.0)
HCT: 44.4 % (ref 39.0–52.0)
Hemoglobin: 15.8 g/dL (ref 13.0–17.0)
Lymphocytes Relative: 41.8 % (ref 12.0–46.0)
Lymphs Abs: 2.5 10*3/uL (ref 0.7–4.0)
MCHC: 35.5 g/dL (ref 30.0–36.0)
MCV: 96.8 fl (ref 78.0–100.0)
Monocytes Absolute: 0.3 10*3/uL (ref 0.1–1.0)
Monocytes Relative: 5.5 % (ref 3.0–12.0)
Neutro Abs: 2.9 10*3/uL (ref 1.4–7.7)
Neutrophils Relative %: 48.6 % (ref 43.0–77.0)
Platelets: 193 10*3/uL (ref 150.0–400.0)
RBC: 4.59 Mil/uL (ref 4.22–5.81)
RDW: 12.3 % (ref 11.5–15.5)
WBC: 5.9 10*3/uL (ref 4.0–10.5)

## 2018-08-08 LAB — LIPID PANEL
Cholesterol: 172 mg/dL (ref 0–200)
HDL: 26.8 mg/dL — ABNORMAL LOW (ref >39.00)
NonHDL: 144.76
Total CHOL/HDL Ratio: 6
Triglycerides: 382 mg/dL — ABNORMAL HIGH (ref 0.0–149.0)
VLDL: 76.4 mg/dL — ABNORMAL HIGH (ref 0.0–40.0)

## 2018-08-08 LAB — BASIC METABOLIC PANEL
BUN: 13 mg/dL (ref 6–23)
CO2: 26 mEq/L (ref 19–32)
Calcium: 9.3 mg/dL (ref 8.4–10.5)
Chloride: 99 mEq/L (ref 96–112)
Creatinine, Ser: 1.03 mg/dL (ref 0.40–1.50)
GFR: 75.01 mL/min (ref >60.00)
Glucose, Bld: 208 mg/dL — ABNORMAL HIGH (ref 70–99)
Potassium: 4.4 mEq/L (ref 3.5–5.1)
Sodium: 134 mEq/L — ABNORMAL LOW (ref 135–145)

## 2018-08-08 LAB — HEPATIC FUNCTION PANEL
ALT: 30 U/L (ref 0–53)
AST: 23 U/L (ref 0–37)
Albumin: 4.4 g/dL (ref 3.5–5.2)
Alkaline Phosphatase: 43 U/L (ref 39–117)
Bilirubin, Direct: 0.1 mg/dL (ref 0.0–0.3)
Total Bilirubin: 0.7 mg/dL (ref 0.2–1.2)
Total Protein: 6.9 g/dL (ref 6.0–8.3)

## 2018-08-08 LAB — LDL CHOLESTEROL, DIRECT: Direct LDL: 89 mg/dL

## 2018-08-08 LAB — HEMOGLOBIN A1C: Hgb A1c MFr Bld: 8.5 % — ABNORMAL HIGH (ref 4.6–6.5)

## 2018-08-08 LAB — TSH: TSH: 1.07 u[IU]/mL (ref 0.35–4.50)

## 2018-08-09 ENCOUNTER — Other Ambulatory Visit: Payer: Self-pay | Admitting: General Practice

## 2018-08-09 ENCOUNTER — Encounter: Payer: Self-pay | Admitting: Family Medicine

## 2018-08-09 MED ORDER — EMPAGLIFLOZIN 10 MG PO TABS: 10.0000 mg | ORAL_TABLET | Freq: Every day | ORAL | 1 refills | Status: DC

## 2018-08-09 MED ORDER — ATORVASTATIN CALCIUM 10 MG PO TABS: 10.0000 mg | ORAL_TABLET | Freq: Every day | ORAL | 1 refills | Status: DC

## 2018-08-29 ENCOUNTER — Ambulatory Visit (HOSPITAL_COMMUNITY)
Admission: RE | Admit: 2018-08-29 | Discharge: 2018-08-29 | Disposition: A | Discharge status: Home / Self Care | Payer: 59 | Source: Ambulatory Visit | Attending: Internal Medicine | Admitting: Internal Medicine

## 2018-08-29 ENCOUNTER — Other Ambulatory Visit: Payer: Self-pay

## 2018-08-29 DIAGNOSIS — E119 Type 2 diabetes mellitus without complications: Secondary | ICD-10-CM | POA: Diagnosis not present

## 2018-08-29 DIAGNOSIS — I110 Hypertensive heart disease with heart failure: Secondary | ICD-10-CM | POA: Insufficient documentation

## 2018-08-29 DIAGNOSIS — I5022 Chronic systolic (congestive) heart failure: Secondary | ICD-10-CM

## 2018-08-29 DIAGNOSIS — Z87891 Personal history of nicotine dependence: Secondary | ICD-10-CM | POA: Insufficient documentation

## 2018-08-29 LAB — CBC
HCT: 44.4 % (ref 39.0–52.0)
Hemoglobin: 15.3 g/dL (ref 13.0–17.0)
MCH: 32.9 pg (ref 26.0–34.0)
MCHC: 34.5 g/dL (ref 30.0–36.0)
MCV: 95.5 fL (ref 80.0–100.0)
Platelets: 193 10*3/uL (ref 150–400)
RBC: 4.65 MIL/uL (ref 4.22–5.81)
RDW: 11.4 % — ABNORMAL LOW (ref 11.5–15.5)
WBC: 6.2 10*3/uL (ref 4.0–10.5)
nRBC: 0 % (ref 0.0–0.2)

## 2018-08-29 LAB — BASIC METABOLIC PANEL
Anion gap: 12 (ref 5–15)
BUN: 14 mg/dL (ref 6–20)
CO2: 24 mmol/L (ref 22–32)
Calcium: 9.7 mg/dL (ref 8.9–10.3)
Chloride: 100 mmol/L (ref 98–111)
Creatinine, Ser: 1.07 mg/dL (ref 0.61–1.24)
GFR calc Af Amer: >60 mL/min (ref >60)
GFR calc non Af Amer: >60 mL/min (ref >60)
Glucose, Bld: 165 mg/dL — ABNORMAL HIGH (ref 70–99)
Potassium: 4.8 mmol/L (ref 3.5–5.1)
Sodium: 136 mmol/L (ref 135–145)

## 2018-08-29 NOTE — Progress Notes (Signed)
  Echocardiogram 2D Echocardiogram has been performed.  Matthew Green 08/29/2018, 10:43 AM

## 2018-08-31 ENCOUNTER — Other Ambulatory Visit (HOSPITAL_COMMUNITY): Payer: Self-pay | Admitting: *Deleted

## 2018-08-31 DIAGNOSIS — I5022 Chronic systolic (congestive) heart failure: Secondary | ICD-10-CM

## 2018-08-31 MED ORDER — APIXABAN 2.5 MG PO TABS: 2.5000 mg | ORAL_TABLET | Freq: Two times a day (BID) | ORAL | 6 refills | Status: DC

## 2018-08-31 MED ORDER — SACUBITRIL-VALSARTAN 24-26 MG PO TABS: 1.0000 | ORAL_TABLET | Freq: Two times a day (BID) | ORAL | 3 refills | Status: DC

## 2018-09-06 ENCOUNTER — Other Ambulatory Visit: Payer: Self-pay | Admitting: Family Medicine

## 2018-09-06 DIAGNOSIS — E119 Type 2 diabetes mellitus without complications: Secondary | ICD-10-CM

## 2018-09-10 ENCOUNTER — Encounter: Payer: Self-pay | Admitting: Family Medicine

## 2018-09-11 ENCOUNTER — Other Ambulatory Visit: Payer: Self-pay | Admitting: General Practice

## 2018-09-11 ENCOUNTER — Telehealth (HOSPITAL_COMMUNITY): Payer: Self-pay

## 2018-09-11 MED ORDER — LISINOPRIL 20 MG PO TABS: 20.0000 mg | ORAL_TABLET | Freq: Every day | ORAL | 3 refills | Status: DC

## 2018-09-11 MED ORDER — EMPAGLIFLOZIN 10 MG PO TABS: 10.0000 mg | ORAL_TABLET | Freq: Every day | ORAL | 1 refills | Status: DC

## 2018-09-11 NOTE — Telephone Encounter (Signed)
Spoke with patient, he was aware his insurance denied coverage of entresto  Per Dr Aundra Dubin, recommended discontinuing entresto and starting lisinopril 20mg  daily. Pt verbalized understanding. Sent to pharmacy

## 2018-09-11 NOTE — Telephone Encounter (Signed)
-----   Message from Larey Dresser, MD sent at 09/10/2018  8:32 PM EDT ----- Regarding: RE: Can stop Entresto, start lisinopril 20 mg daily ----- Message ----- From: Valeda Malm, RN Sent: 09/10/2018  12:44 PM EDT To: Larey Dresser, MD  Pt was denied Delene Loll because his EF is 55% and he is Class I. He has been on medicine for a while, do you want to change med?

## 2018-09-14 ENCOUNTER — Encounter: Payer: Self-pay | Admitting: Family Medicine

## 2018-09-25 ENCOUNTER — Telehealth (HOSPITAL_COMMUNITY): Payer: Self-pay | Admitting: Pharmacy Technician

## 2018-09-25 NOTE — Telephone Encounter (Signed)
Advanced Heart Failure Patient Advocate Encounter  Prior Authorization for Delene Loll has been approved.    PA# [id number] Effective dates: 07/11/2014 through 03/27/2038  Jodelle Gross (CPhT) [place] Patient Advocate  09/25/2018 1:52 PM

## 2018-10-12 ENCOUNTER — Encounter: Payer: BLUE CROSS/BLUE SHIELD | Admitting: Family Medicine

## 2018-11-07 ENCOUNTER — Other Ambulatory Visit: Payer: Self-pay | Admitting: Family Medicine

## 2018-11-27 ENCOUNTER — Other Ambulatory Visit: Payer: Self-pay | Admitting: Family Medicine

## 2019-02-01 ENCOUNTER — Other Ambulatory Visit: Payer: Self-pay

## 2019-02-01 ENCOUNTER — Ambulatory Visit (INDEPENDENT_AMBULATORY_CARE_PROVIDER_SITE_OTHER): Payer: 59 | Admitting: Family Medicine

## 2019-02-01 ENCOUNTER — Encounter: Payer: Self-pay | Admitting: Family Medicine

## 2019-02-01 VITALS — BP 130/82 | HR 78 | Temp 98.0°F | Resp 16 | Ht 68.0 in | Wt 185.4 lb

## 2019-02-01 DIAGNOSIS — Z125 Encounter for screening for malignant neoplasm of prostate: Secondary | ICD-10-CM

## 2019-02-01 DIAGNOSIS — Z1211 Encounter for screening for malignant neoplasm of colon: Secondary | ICD-10-CM | POA: Diagnosis not present

## 2019-02-01 DIAGNOSIS — Z23 Encounter for immunization: Secondary | ICD-10-CM | POA: Diagnosis not present

## 2019-02-01 DIAGNOSIS — E119 Type 2 diabetes mellitus without complications: Secondary | ICD-10-CM | POA: Diagnosis not present

## 2019-02-01 DIAGNOSIS — Z0000 Encounter for general adult medical examination without abnormal findings: Secondary | ICD-10-CM

## 2019-02-01 LAB — CBC WITH DIFFERENTIAL/PLATELET
Basophils Absolute: 0.1 10*3/uL (ref 0.0–0.1)
Basophils Relative: 0.7 % (ref 0.0–3.0)
Eosinophils Absolute: 0.2 10*3/uL (ref 0.0–0.7)
Eosinophils Relative: 2.5 % (ref 0.0–5.0)
HCT: 47.5 % (ref 39.0–52.0)
Hemoglobin: 16.1 g/dL (ref 13.0–17.0)
Lymphocytes Relative: 26.6 % (ref 12.0–46.0)
Lymphs Abs: 2.3 10*3/uL (ref 0.7–4.0)
MCHC: 34 g/dL (ref 30.0–36.0)
MCV: 97.6 fl (ref 78.0–100.0)
Monocytes Absolute: 0.5 10*3/uL (ref 0.1–1.0)
Monocytes Relative: 5.4 % (ref 3.0–12.0)
Neutro Abs: 5.5 10*3/uL (ref 1.4–7.7)
Neutrophils Relative %: 64.8 % (ref 43.0–77.0)
Platelets: 270 10*3/uL (ref 150.0–400.0)
RBC: 4.87 Mil/uL (ref 4.22–5.81)
RDW: 12.7 % (ref 11.5–15.5)
WBC: 8.5 10*3/uL (ref 4.0–10.5)

## 2019-02-01 LAB — LIPID PANEL
Cholesterol: 140 mg/dL (ref 0–200)
HDL: 28.6 mg/dL — ABNORMAL LOW (ref >39.00)
LDL Cholesterol: 81 mg/dL (ref 0–99)
NonHDL: 111.13
Total CHOL/HDL Ratio: 5
Triglycerides: 152 mg/dL — ABNORMAL HIGH (ref 0.0–149.0)
VLDL: 30.4 mg/dL (ref 0.0–40.0)

## 2019-02-01 LAB — HEPATIC FUNCTION PANEL
ALT: 26 U/L (ref 0–53)
AST: 21 U/L (ref 0–37)
Albumin: 4.7 g/dL (ref 3.5–5.2)
Alkaline Phosphatase: 50 U/L (ref 39–117)
Bilirubin, Direct: 0.1 mg/dL (ref 0.0–0.3)
Total Bilirubin: 0.5 mg/dL (ref 0.2–1.2)
Total Protein: 7.6 g/dL (ref 6.0–8.3)

## 2019-02-01 LAB — PSA: PSA: 0.6 ng/mL (ref 0.10–4.00)

## 2019-02-01 LAB — BASIC METABOLIC PANEL
BUN: 17 mg/dL (ref 6–23)
CO2: 26 mEq/L (ref 19–32)
Calcium: 9.9 mg/dL (ref 8.4–10.5)
Chloride: 101 mEq/L (ref 96–112)
Creatinine, Ser: 1 mg/dL (ref 0.40–1.50)
GFR: 77.48 mL/min (ref >60.00)
Glucose, Bld: 156 mg/dL — ABNORMAL HIGH (ref 70–99)
Potassium: 5 mEq/L (ref 3.5–5.1)
Sodium: 136 mEq/L (ref 135–145)

## 2019-02-01 LAB — HEMOGLOBIN A1C: Hgb A1c MFr Bld: 7.2 % — ABNORMAL HIGH (ref 4.6–6.5)

## 2019-02-01 LAB — TSH: TSH: 0.9 u[IU]/mL (ref 0.35–4.50)

## 2019-02-01 NOTE — Assessment & Plan Note (Signed)
Chronic problem.  UTD on eye exam, on ACE for renal protection.  On Jardiance.  Not a candidate for metformin due to heart failure.  Check labs.  Adjust meds prn

## 2019-02-01 NOTE — Patient Instructions (Signed)
Follow up in 3-4 months to recheck diabetes We'll notify you of your lab results and make any changes if needed Continue to work on healthy diet and regular exercise- you look great! Complete and return the Cologuard as directed Call with any questions or concerns Stay Safe!  Stay Healthy!

## 2019-02-01 NOTE — Progress Notes (Signed)
   Subjective:    Patient ID: Matthew Green, male    DOB: [birth year], 53 y.o.   MRN: [id number]  HPI CPE- Due for cologuard.  UTD on eye exam.  Due for foot exam.  Due for flu.  UTD on Tdap and Pneumovax.  Very active job, no formal exercise.   Review of Systems Patient reports no vision/hearing changes, anorexia, fever ,adenopathy, persistant/recurrent hoarseness, swallowing issues, chest pain, palpitations, edema, persistant/recurrent cough, hemoptysis, dyspnea (rest,exertional, paroxysmal nocturnal), gastrointestinal  bleeding (melena, rectal bleeding), abdominal pain, excessive heart burn, GU symptoms (dysuria, hematuria, voiding/incontinence issues) syncope, focal weakness, memory loss, numbness & tingling, skin/hair/nail changes, depression, anxiety, abnormal bruising/bleeding, musculoskeletal symptoms/signs.     Objective:   Physical Exam BP 130/82   Pulse 78   Temp 98 F (36.7 C) (Tympanic)   Resp 16   Ht 5\' 8"  (1.727 m)   Wt 185 lb 6 oz (84.1 kg)   SpO2 98%   BMI 28.19 kg/m   General Appearance:    Alert, cooperative, no distress, appears stated age  Head:    Normocephalic, without obvious abnormality, atraumatic  Eyes:    PERRL, conjunctiva/corneas clear, EOM's intact, fundi    benign, both eyes       Ears:    Normal TM's and external ear canals, both ears  Nose:   Nares normal, septum midline, mucosa normal, no drainage   or sinus tenderness  Throat:   Deferred due to COVID  Neck:   Supple, symmetrical, trachea midline, no adenopathy;       thyroid:  No enlargement/tenderness/nodules  Back:     Symmetric, no curvature, ROM normal, no CVA tenderness  Lungs:     Clear to auscultation bilaterally, respirations unlabored  Chest wall:    No tenderness or deformity  Heart:    Regular rate and rhythm, S1 and S2 normal, no murmur, rub   or gallop  Abdomen:     Soft, non-tender, bowel sounds active all four quadrants,    no masses, no organomegaly  Genitalia:    Normal  male without lesion, discharge or tenderness  Rectal:    Normal tone, normal prostate, no masses or tenderness  Extremities:   Extremities normal, atraumatic, no cyanosis or edema  Pulses:   2+ and symmetric all extremities  Skin:   Skin color, texture, turgor normal, no rashes or lesions  Lymph nodes:   Cervical, supraclavicular, and axillary nodes normal  Neurologic:   CNII-XII intact. Normal strength, sensation and reflexes      throughout          Assessment & Plan:

## 2019-02-01 NOTE — Assessment & Plan Note (Signed)
Pt's PE WNL w/ exception of being mildly overweight.  Due for cologuard- ordered.  UTD on immunizations- flu given today.  Check labs.  Anticipatory guidance provided.

## 2019-02-02 ENCOUNTER — Encounter: Payer: Self-pay | Admitting: General Practice

## 2019-03-07 ENCOUNTER — Other Ambulatory Visit: Payer: Self-pay | Admitting: Family Medicine

## 2019-03-12 ENCOUNTER — Other Ambulatory Visit (HOSPITAL_COMMUNITY): Payer: Self-pay | Admitting: Cardiology

## 2019-03-12 LAB — COLOGUARD: Cologuard: POSITIVE — AB

## 2019-03-26 ENCOUNTER — Encounter: Payer: Self-pay | Admitting: Family Medicine

## 2019-03-26 DIAGNOSIS — R195 Other fecal abnormalities: Secondary | ICD-10-CM

## 2019-04-12 ENCOUNTER — Telehealth: Payer: Self-pay

## 2019-04-12 ENCOUNTER — Encounter: Payer: Self-pay | Admitting: Nurse Practitioner

## 2019-04-12 ENCOUNTER — Ambulatory Visit: Payer: 59 | Admitting: Nurse Practitioner

## 2019-04-12 VITALS — BP 128/86 | HR 88 | Temp 97.8°F | Ht 68.0 in | Wt 186.4 lb

## 2019-04-12 DIAGNOSIS — R195 Other fecal abnormalities: Secondary | ICD-10-CM | POA: Diagnosis not present

## 2019-04-12 DIAGNOSIS — Z01818 Encounter for other preprocedural examination: Secondary | ICD-10-CM

## 2019-04-12 DIAGNOSIS — Z7901 Long term (current) use of anticoagulants: Secondary | ICD-10-CM

## 2019-04-12 MED ORDER — NA SULFATE-K SULFATE-MG SULF 17.5-3.13-1.6 GM/177ML PO SOLN: ORAL | 0 refills | Status: DC

## 2019-04-12 NOTE — Telephone Encounter (Signed)
   Primary Cardiologist: [name], MD  Chart reviewed as part of pre-operative protocol coverage. Per pharmacy recommendation, patient can hold eliquis 1 day prior to his colonoscopy given high risk off anticoagulation.   I will route this recommendation to the requesting party via Epic fax function and remove from pre-op pool.  Please call with questions.  Callback: - Please reach out to the requesting surgeons office to see if additional time off eliquis is necessary, in which case this request will be sent to Dr. Aundra Dubin to review.   Abigail Butts, PA-C 04/12/2019, 1:16 PM

## 2019-04-12 NOTE — Telephone Encounter (Signed)
Patient with diagnosis of PE on Eliquis for anticoagulation.    Procedure: colonoscopy Date of procedure: 05/08/2019  Patient has a history of a single, unprovoked PE. He is factor V Leiden heterozygote.   CrCl 75 ml/min  Patient is at higher risk off anticoagulation due to factor V leiden. Due to low risk procedure, would recommend patient hold anticoagulation for 1 day.  If GI still wants a 2 day hold, then I will defer to Dr. Aundra Dubin.

## 2019-04-12 NOTE — Patient Instructions (Addendum)
If you are age 56 or older, your body mass index should be between 23-30. Your Body mass index is 28.34 kg/m. If this is out of the aforementioned range listed, please consider follow up with your Primary Care Provider.  If you are age 10 or younger, your body mass index should be between 19-25. Your Body mass index is 28.34 kg/m. If this is out of the aformentioned range listed, please consider follow up with your Primary Care Provider.   You have been scheduled for a colonoscopy. Please follow written instructions given to you at your visit today.  Please pick up your prep supplies at the pharmacy within the next 1-3 days. If you use inhalers (even only as needed), please bring them with you on the day of your procedure. Your physician has requested that you go to www.startemmi.com and enter the access code given to you at your visit today. This web site gives a general overview about your procedure. However, you should still follow specific instructions given to you by our office regarding your preparation for the procedure.  We have sent the following medications to your pharmacy for you to pick up at your convenience: [place] will be contacted by our office prior to your procedure for directions on holding your Eliquis.  If you do not hear from our office 1 week prior to your scheduled procedure, please call [phone] to discuss.    Thank you for choosing me and [facility].   [name], NP

## 2019-04-12 NOTE — Progress Notes (Signed)
ASSESSMENT / PLAN:   54 year old male with PMH significant for DM2, chronic systolic CHF, HTN, hyperlipidemia, history of PE on chronic Eliquis   63.  54 year old male with positive Cologuard.  No overt GI bleeding.  No GI symptoms.  Normal hemoglobin in November 2020.  -For further evaluation patient will be scheduled for colonoscopy.   The risks and benefits of colonoscopy with possible polypectomy / biopsies were discussed and the patient agrees to proceed.   2. History of pulmonary embolism, on Eliquis.  --Hold Eliquis for 2 days before procedure - will instruct when and how to resume after procedure. Patient understands that there is a low but real risk of a cardiovascular event such as heart attack, stroke, or recurrent embolism /  thrombosis while off blood thinner. The patient consents to proceed. Will communicate by phone or EMR with patient's prescribing provider to confirm that holding Eliquis is reasonable in this case.   3. DM2, last hemoglobin A1c early November was 7.2  4. Chronic systolic heart failure, EF 55% on echo in June 2020  HPI:    Referring Provider:   Annye Asa, MD    Reason for referral:    Positive Cologuard test  Chief Complaint:   None  Patient is a 54 year old male referred here for a positive Cologuard test.  He has not had any overt GI bleeding.  No bowel changes, no blood in stool.  No nausea, vomiting or other GI complaints.  No family history colon cancer.  Normal hemoglobin in November  Matthew Green has a history of chronic systolic heart failure, followed by Dr.McLean.  No complaints of chest pain or shortness of breath.  He is on Eliquis for history of P  Data Reviewed:  02/01/19 Glucose 156, CMET o/w normal CBC normal   Past Medical History:  Diagnosis Date  . CHF (congestive heart failure) ([place])   . Diabetes mellitus without complication ([place])   . Hypertension      Past Surgical History:  Procedure Laterality Date    . NO PAST SURGERIES     Family History  Problem Relation Age of Onset  . Heart failure Mother   . Heart failure Brother   . Deep vein thrombosis Father   . Esophageal cancer Neg Hx   . Colon cancer Neg Hx   . Stomach cancer Neg Hx   . Pancreatic cancer Neg Hx    Social History   Tobacco Use  . Smoking status: Former Smoker    Types: Cigarettes    Quit date: 02/25/2014    Years since quitting: 5.1  . Smokeless tobacco: Current User    Types: Snuff  Substance Use Topics  . Alcohol use: Yes    Comment: occ  . Drug use: No   Current Outpatient Medications  Medication Sig Dispense Refill  . apixaban (ELIQUIS) 2.5 MG TABS tablet Take 1 tablet (2.5 mg total) by mouth 2 (two) times daily. 60 tablet 6  . atorvastatin (LIPITOR) 10 MG tablet TAKE 1 TABLET BY MOUTH EVERY DAY 90 tablet 1  . Blood Glucose Monitoring Suppl (CONTOUR NEXT MONITOR) w/Device KIT 1 Units by Does not apply route daily. Check blood sugars once daily Dx:E11.9 1 kit 0  . carvedilol (COREG) 12.5 MG tablet TAKE 1 TABLET (12.5 MG TOTAL) BY MOUTH 2 (TWO) TIMES DAILY WITH A MEAL. 180 tablet 3  . CONTOUR NEXT TEST test strip CHECK  BLOOD SUGAR ONCE DAILY DX: E11.9 100 each 12  . EPINEPHrine 0.3 mg/0.3 mL IJ SOAJ injection USE AS DIRECTED ON PACKAGE AS NEEDED FOR ALLERGIC REACTION  0  . fenofibrate 160 MG tablet TAKE 1 TABLET BY MOUTH EVERY DAY 90 tablet 1  . JARDIANCE 10 MG TABS tablet TAKE 1 TABLET BY MOUTH EVERY DAY. MANUAL PA SEND 09/18/2018 90 tablet 1  . lisinopril (ZESTRIL) 20 MG tablet Take 1 tablet (20 mg total) by mouth daily. 90 tablet 3  . spironolactone (ALDACTONE) 25 MG tablet TAKE 1 TABLET BY MOUTH EVERY DAY 90 tablet 0  . triamcinolone cream (KENALOG) 0.1 % APPLY TO AFFECTED AREA TWICE A DAY  2  . Na Sulfate-K Sulfate-Mg Sulf 17.5-3.13-1.6 GM/177ML SOLN Suprep-Use as directed 354 mL 0   No current facility-administered medications for this visit.   Allergies  Allergen Reactions  . Other     Allergic to  Red Meat    Review of Systems: All systems reviewed and negative except where noted in HPI.    Physical Exam:    Wt Readings from Last 3 Encounters:  04/12/19 186 lb 6 oz (84.5 kg)  02/01/19 185 lb 6 oz (84.1 kg)  07/27/18 185 lb (83.9 kg)    BP 128/86 (BP Location: Left Arm, Patient Position: Sitting, Cuff Size: Normal)   Pulse 88   Temp 97.8 F (36.6 C)   Ht 5' 8"  (1.727 m)   Wt 186 lb 6 oz (84.5 kg)   SpO2 97%   BMI 28.34 kg/m  Constitutional:  Pleasant male in no acute distress. Psychiatric: Normal mood and affect. Behavior is normal. EENT: Pupils normal.  Conjunctivae are normal. No scleral icterus. Neck supple.  Cardiovascular: Normal rate, regular rhythm. No edema Pulmonary/chest: Effort normal and breath sounds normal. No wheezing, rales or rhonchi. Abdominal: Soft, nondistended, nontender. Bowel sounds active throughout. There are no masses palpable. No hepatomegaly. Neurological: Alert and oriented to person place and time. Skin: Skin is warm and dry. No rashes noted.  [name], NP  04/12/2019, 1:36 PM  Cc: Midge Minium, MD

## 2019-04-12 NOTE — Telephone Encounter (Signed)
[facility] Pre-operative Risk Assessment     Request for surgical clearance:     Endoscopy Procedure  What type of surgery is being performed?     Colonoscopy  When is this surgery scheduled?     05/08/19  What type of clearance is required ?   Pharmacy  Are there any medications that need to be held prior to surgery and how long? HOLD ELIQUIS FOR TWO DAYS PRIOR  Practice name and name of physician performing surgery?      [facility]/Dr. Cranford Mon  What is your office phone and fax number?      Phone- [phone]  Fax- [phone]  Attn: Peter Congo, RMA  Anesthesia type (None, local, MAC, general) ?       MAC Attn: Dr. Aundra Dubin

## 2019-04-16 NOTE — Progress Notes (Signed)
Reviewed and agree with documentation and assessment and plan. K. Veena Demiana Crumbley , MD   

## 2019-04-16 NOTE — Telephone Encounter (Signed)
That's fine, thanks!

## 2019-04-17 NOTE — Telephone Encounter (Signed)
Spoke with patient this afternoon.  Advised patient to HOLD Eliquis only one day prior to procedure per clearance instructions.  Patient verbalized understanding.

## 2019-05-06 ENCOUNTER — Ambulatory Visit (INDEPENDENT_AMBULATORY_CARE_PROVIDER_SITE_OTHER): Payer: 59

## 2019-05-06 ENCOUNTER — Encounter: Payer: Self-pay | Admitting: Gastroenterology

## 2019-05-06 ENCOUNTER — Other Ambulatory Visit: Payer: Self-pay | Admitting: Gastroenterology

## 2019-05-06 DIAGNOSIS — Z1159 Encounter for screening for other viral diseases: Secondary | ICD-10-CM

## 2019-05-06 LAB — SARS CORONAVIRUS 2 (TAT 6-24 HRS): SARS Coronavirus 2: NEGATIVE

## 2019-05-08 ENCOUNTER — Encounter: Payer: Self-pay | Admitting: Gastroenterology

## 2019-05-08 ENCOUNTER — Other Ambulatory Visit: Payer: Self-pay

## 2019-05-08 ENCOUNTER — Ambulatory Visit (AMBULATORY_SURGERY_CENTER): Payer: 59 | Admitting: Gastroenterology

## 2019-05-08 VITALS — BP 107/72 | HR 75 | Temp 96.9°F | Resp 11 | Ht 68.0 in | Wt 186.0 lb

## 2019-05-08 DIAGNOSIS — D128 Benign neoplasm of rectum: Secondary | ICD-10-CM | POA: Diagnosis not present

## 2019-05-08 DIAGNOSIS — D125 Benign neoplasm of sigmoid colon: Secondary | ICD-10-CM

## 2019-05-08 DIAGNOSIS — D122 Benign neoplasm of ascending colon: Secondary | ICD-10-CM

## 2019-05-08 DIAGNOSIS — K648 Other hemorrhoids: Secondary | ICD-10-CM | POA: Diagnosis not present

## 2019-05-08 DIAGNOSIS — R195 Other fecal abnormalities: Secondary | ICD-10-CM | POA: Diagnosis present

## 2019-05-08 MED ORDER — SODIUM CHLORIDE 0.9 % IV SOLN: 500.0000 mL | Freq: Once | INTRAVENOUS | Status: DC

## 2019-05-08 NOTE — Progress Notes (Signed)
To PACU, VSS. Report to [email or web address].tb 

## 2019-05-08 NOTE — Patient Instructions (Signed)
Resume Eliquis (apixaban) at prior dose in 2 days.   YOU HAD AN ENDOSCOPIC PROCEDURE TODAY AT [facility]:   Refer to the procedure report that was given to you for any specific questions about what was found during the examination.  If the procedure report does not answer your questions, please call your gastroenterologist to clarify.  If you requested that your care partner not be given the details of your procedure findings, then the procedure report has been included in a sealed envelope for you to review at your convenience later.  YOU SHOULD EXPECT: Some feelings of bloating in the abdomen. Passage of more gas than usual.  Walking can help get rid of the air that was put into your GI tract during the procedure and reduce the bloating. If you had a lower endoscopy (such as a colonoscopy or flexible sigmoidoscopy) you may notice spotting of blood in your stool or on the toilet paper. If you underwent a bowel prep for your procedure, you may not have a normal bowel movement for a few days.  Please Note:  You might notice some irritation and congestion in your nose or some drainage.  This is from the oxygen used during your procedure.  There is no need for concern and it should clear up in a day or so.  SYMPTOMS TO REPORT IMMEDIATELY:   Following lower endoscopy (colonoscopy or flexible sigmoidoscopy):  Excessive amounts of blood in the stool  Significant tenderness or worsening of abdominal pains  Swelling of the abdomen that is new, acute  Fever of 100F or higher   For urgent or emergent issues, a gastroenterologist can be reached at any hour by calling [phone].   DIET:  We do recommend a small meal at first, but then you may proceed to your regular diet.  Drink plenty of fluids but you should avoid alcoholic beverages for 24 hours.  ACTIVITY:  You should plan to take it easy for the rest of today and you should NOT DRIVE or use heavy machinery until tomorrow  (because of the sedation medicines used during the test).    FOLLOW UP: Our staff will call the number listed on your records 48-72 hours following your procedure to check on you and address any questions or concerns that you may have regarding the information given to you following your procedure. If we do not reach you, we will leave a message.  We will attempt to reach you two times.  During this call, we will ask if you have developed any symptoms of COVID 19. If you develop any symptoms (ie: fever, flu-like symptoms, shortness of breath, cough etc.) before then, please call [phone].  If you test positive for Covid 19 in the 2 weeks post procedure, please call and report this information to [place].    If any biopsies were taken you will be contacted by phone or by letter within the next 1-3 weeks.  Please call us at [phone] if you have not heard about the biopsies in 3 weeks.    SIGNATURES/CONFIDENTIALITY: You and/or your care partner have signed paperwork which will be entered into your electronic medical record.  These signatures attest to the fact that that the information above on your After Visit Summary has been reviewed and is understood.  Full responsibility of the confidentiality of this discharge information lies with you and/or your care-partner.   Thank you for allowing [place] to provide your healthcare today.

## 2019-05-08 NOTE — Progress Notes (Signed)
Temp by Ledora Bottcher by KA

## 2019-05-08 NOTE — Op Note (Addendum)
[facility] Patient Name: Matthew Green Procedure Date: 05/08/2019 1:20 PM MRN: [id number] Endoscopist: Mauri Pole , MD Age: 54 Referring MD:  Date of Birth: [birth year] Gender: Male Account #: [id number] Procedure:                Colonoscopy Indications:              Positive Cologuard test Medicines:                Monitored Anesthesia Care Procedure:                Pre-Anesthesia Assessment:                           - Prior to the procedure, a History and Physical                            was performed, and patient medications and                            allergies were reviewed. The patient's tolerance of                            previous anesthesia was also reviewed. The risks                            and benefits of the procedure and the sedation                            options and risks were discussed with the patient.                            All questions were answered, and informed consent                            was obtained. Prior Anticoagulants: The patient                            last took Eliquis (apixaban) 2 days prior to the                            procedure. ASA Grade Assessment: II - A patient                            with mild systemic disease. After reviewing the                            risks and benefits, the patient was deemed in                            satisfactory condition to undergo the procedure.                           After obtaining informed consent, the colonoscope  was passed under direct vision. Throughout the                            procedure, the patient's blood pressure, pulse, and                            oxygen saturations were monitored continuously. The                            Colonoscope was introduced through the anus and                            advanced to the the cecum, identified by                            appendiceal orifice and ileocecal valve.  The                            colonoscopy was performed without difficulty. The                            patient tolerated the procedure well. The quality                            of the bowel preparation was excellent. The                            ileocecal valve, appendiceal orifice, and rectum                            were photographed. Scope In: 1:30:54 PM Scope Out: 1:55:01 PM Scope Withdrawal Time: 0 hours 19 minutes 36 seconds  Total Procedure Duration: 0 hours 24 minutes 7 seconds  Findings:                 The perianal and digital rectal examinations were                            normal.                           A less than 1 mm polyp was found in the ascending                            colon. The polyp was sessile. The polyp was removed                            with a cold biopsy forceps. Resection and retrieval                            were complete.                           Four sessile polyps were found in the rectum and  ascending colon. The polyps were 4 to 12 mm in                            size. These polyps were removed with a cold snare.                            Resection and retrieval were complete.                           Two pedunculated polyps were found in the sigmoid                            colon. The polyps were 9 to 14 mm in size. These                            polyps were removed with a hot snare. Resection and                            retrieval were complete.                           Non-bleeding internal hemorrhoids were found during                            retroflexion. The hemorrhoids were small. Complications:            No immediate complications. Estimated Blood Loss:     Estimated blood loss was minimal. Impression:               - One less than 1 mm polyp in the ascending colon,                            removed with a cold biopsy forceps. Resected and                             retrieved.                           - Four 4 to 12 mm polyps in the rectum and in the                            ascending colon, removed with a cold snare.                            Resected and retrieved.                           - Two 9 to 14 mm polyps in the sigmoid colon,                            removed with a hot snare. Resected and retrieved.                           - Non-bleeding  internal hemorrhoids. Recommendation:           - Patient has a contact number available for                            emergencies. The signs and symptoms of potential                            delayed complications were discussed with the                            patient. Return to normal activities tomorrow.                            Written discharge instructions were provided to the                            patient.                           - Resume previous diet.                           - Continue present medications.                           - Await pathology results.                           - Repeat colonoscopy in 3 years for surveillance                            based on pathology results.                           - Resume Eliquis (apixaban) at prior dose in 2                            days. Refer to managing physician for further                            adjustment of therapy. Mauri Pole, MD 05/08/2019 2:04:17 PM This report has been signed electronically.

## 2019-05-08 NOTE — Progress Notes (Signed)
Called to room to assist during endoscopic procedure.  Patient ID and intended procedure confirmed with present staff. Received instructions for my participation in the procedure from the performing physician.  

## 2019-05-10 ENCOUNTER — Telehealth: Payer: Self-pay | Admitting: *Deleted

## 2019-05-10 NOTE — Telephone Encounter (Signed)
  Follow up Call-  Call back number 05/08/2019  Post procedure Call Back phone  # [phone]  Permission to leave phone message Yes  Some recent data might be hidden     Patient questions:  Do you have a fever, pain , or abdominal swelling? No. Pain Score  0 *  Have you tolerated food without any problems? Yes.    Have you been able to return to your normal activities? Yes.    Do you have any questions about your discharge instructions: Diet   No. Medications  No. Follow up visit  No.  Do you have questions or concerns about your Care? No.  Actions: * If pain score is 4 or above: No action needed, pain <4.  1. Have you developed a fever since your procedure? no  2.   Have you had an respiratory symptoms (SOB or cough) since your procedure? no  3.   Have you tested positive for COVID 19 since your procedure no  4.   Have you had any family members/close contacts diagnosed with the COVID 19 since your procedure?  no   If yes to any of these questions please route to Joylene John, RN and Alphonsa Gin, Therapist, sports.

## 2019-05-11 ENCOUNTER — Other Ambulatory Visit: Payer: Self-pay | Admitting: Family Medicine

## 2019-05-16 ENCOUNTER — Encounter: Payer: Self-pay | Admitting: Gastroenterology

## 2019-05-31 ENCOUNTER — Other Ambulatory Visit: Payer: Self-pay

## 2019-05-31 ENCOUNTER — Encounter: Payer: Self-pay | Admitting: General Practice

## 2019-05-31 ENCOUNTER — Ambulatory Visit: Payer: 59 | Admitting: Family Medicine

## 2019-05-31 ENCOUNTER — Encounter: Payer: Self-pay | Admitting: Family Medicine

## 2019-05-31 VITALS — BP 121/80 | HR 77 | Temp 97.9°F | Resp 17 | Ht 68.0 in | Wt 182.2 lb

## 2019-05-31 DIAGNOSIS — E119 Type 2 diabetes mellitus without complications: Secondary | ICD-10-CM

## 2019-05-31 LAB — BASIC METABOLIC PANEL
BUN: 14 mg/dL (ref 6–23)
CO2: 27 mEq/L (ref 19–32)
Calcium: 9.9 mg/dL (ref 8.4–10.5)
Chloride: 100 mEq/L (ref 96–112)
Creatinine, Ser: 0.95 mg/dL (ref 0.40–1.50)
GFR: 82.1 mL/min (ref >60.00)
Glucose, Bld: 169 mg/dL — ABNORMAL HIGH (ref 70–99)
Potassium: 5 mEq/L (ref 3.5–5.1)
Sodium: 136 mEq/L (ref 135–145)

## 2019-05-31 LAB — HEMOGLOBIN A1C: Hgb A1c MFr Bld: 7.3 % — ABNORMAL HIGH (ref 4.6–6.5)

## 2019-05-31 NOTE — Assessment & Plan Note (Signed)
Chronic problem for pt.  Hx of adequate control.  Currently asymptomatic.  Due for eye exam- pt to schedule.  Foot exam done today.  Check labs.  Adjust meds prn

## 2019-05-31 NOTE — Patient Instructions (Signed)
Follow up in 3-4 months We'll notify you of your lab results and make any changes if needed Continue to work on healthy diet and regular exercise Schedule your eye exam at your convenience Call with any questions or concerns Stay Safe!  Stay Healthy!

## 2019-05-31 NOTE — Progress Notes (Signed)
   Subjective:    Patient ID: Matthew Green, male    DOB: 08-19-1965, 54 y.o.   MRN: [id number]  HPI DM- chronic problem, on Jardiance 10mg .  Last A1C 7.2.  On ACE for renal protection.  Due for eye exam, foot exam.  Pt is down 4 lbs.  'I feel great'.  Pt is very active.  No CP, SOB, HAs, visual changes, abd pain, N/V.  Denies symptomatic lows.  No numbness/tingling of hands/feet.   Review of Systems For ROS see HPI   This visit occurred during the SARS-CoV-2 public health emergency.  Safety protocols were in place, including screening questions prior to the visit, additional usage of staff PPE, and extensive cleaning of exam room while observing appropriate contact time as indicated for disinfecting solutions.       Objective:   Physical Exam Vitals reviewed.  Constitutional:      General: He is not in acute distress.    Appearance: He is well-developed.  HENT:     Head: Normocephalic and atraumatic.  Eyes:     Conjunctiva/sclera: Conjunctivae normal.     Pupils: Pupils are equal, round, and reactive to light.  Neck:     Thyroid: No thyromegaly.  Cardiovascular:     Rate and Rhythm: Normal rate and regular rhythm.     Heart sounds: Normal heart sounds. No murmur.  Pulmonary:     Effort: Pulmonary effort is normal. No respiratory distress.     Breath sounds: Normal breath sounds.  Abdominal:     General: Bowel sounds are normal. There is no distension.     Palpations: Abdomen is soft.  Musculoskeletal:     Cervical back: Normal range of motion and neck supple.  Lymphadenopathy:     Cervical: No cervical adenopathy.  Skin:    General: Skin is warm and dry.  Neurological:     Mental Status: He is alert and oriented to person, place, and time.     Cranial Nerves: No cranial nerve deficit.  Psychiatric:        Behavior: Behavior normal.           Assessment & Plan:

## 2019-07-06 ENCOUNTER — Other Ambulatory Visit (HOSPITAL_COMMUNITY): Payer: Self-pay | Admitting: Cardiology

## 2019-07-24 ENCOUNTER — Other Ambulatory Visit: Payer: Self-pay | Admitting: Family Medicine

## 2019-09-04 ENCOUNTER — Other Ambulatory Visit: Payer: Self-pay | Admitting: Family Medicine

## 2019-09-04 ENCOUNTER — Other Ambulatory Visit (HOSPITAL_COMMUNITY): Payer: Self-pay | Admitting: Cardiology

## 2019-09-04 NOTE — Telephone Encounter (Signed)
Please advise, when I go to fill it tells me it is not preferred. It will show you the preferred.

## 2019-09-09 ENCOUNTER — Other Ambulatory Visit (HOSPITAL_COMMUNITY): Payer: Self-pay | Admitting: Cardiology

## 2019-09-09 DIAGNOSIS — I5022 Chronic systolic (congestive) heart failure: Secondary | ICD-10-CM

## 2019-09-24 ENCOUNTER — Ambulatory Visit: Payer: BC Managed Care – PPO | Admitting: Family Medicine

## 2019-09-24 ENCOUNTER — Other Ambulatory Visit: Payer: Self-pay

## 2019-09-24 ENCOUNTER — Encounter: Payer: Self-pay | Admitting: Family Medicine

## 2019-09-24 VITALS — BP 123/83 | HR 82 | Temp 97.9°F | Resp 16 | Ht 68.0 in | Wt 184.0 lb

## 2019-09-24 DIAGNOSIS — I5022 Chronic systolic (congestive) heart failure: Secondary | ICD-10-CM | POA: Diagnosis not present

## 2019-09-24 DIAGNOSIS — I10 Essential (primary) hypertension: Secondary | ICD-10-CM

## 2019-09-24 DIAGNOSIS — E781 Pure hyperglyceridemia: Secondary | ICD-10-CM

## 2019-09-24 DIAGNOSIS — E119 Type 2 diabetes mellitus without complications: Secondary | ICD-10-CM

## 2019-09-24 LAB — HEPATIC FUNCTION PANEL
ALT: 25 U/L (ref 0–53)
AST: 24 U/L (ref 0–37)
Albumin: 4.7 g/dL (ref 3.5–5.2)
Alkaline Phosphatase: 58 U/L (ref 39–117)
Bilirubin, Direct: 0.1 mg/dL (ref 0.0–0.3)
Total Bilirubin: 0.8 mg/dL (ref 0.2–1.2)
Total Protein: 7.2 g/dL (ref 6.0–8.3)

## 2019-09-24 LAB — LIPID PANEL
Cholesterol: 146 mg/dL (ref 0–200)
HDL: 32.6 mg/dL — ABNORMAL LOW (ref >39.00)
NonHDL: 113.86
Total CHOL/HDL Ratio: 4
Triglycerides: 295 mg/dL — ABNORMAL HIGH (ref 0.0–149.0)
VLDL: 59 mg/dL — ABNORMAL HIGH (ref 0.0–40.0)

## 2019-09-24 LAB — CBC WITH DIFFERENTIAL/PLATELET
Basophils Absolute: 0 10*3/uL (ref 0.0–0.1)
Basophils Relative: 0.2 % (ref 0.0–3.0)
Eosinophils Absolute: 0.3 10*3/uL (ref 0.0–0.7)
Eosinophils Relative: 3.7 % (ref 0.0–5.0)
HCT: 47.9 % (ref 39.0–52.0)
Hemoglobin: 16.6 g/dL (ref 13.0–17.0)
Lymphocytes Relative: 29.7 % (ref 12.0–46.0)
Lymphs Abs: 2.3 10*3/uL (ref 0.7–4.0)
MCHC: 34.7 g/dL (ref 30.0–36.0)
MCV: 96.5 fl (ref 78.0–100.0)
Monocytes Absolute: 0.4 10*3/uL (ref 0.1–1.0)
Monocytes Relative: 5.3 % (ref 3.0–12.0)
Neutro Abs: 4.8 10*3/uL (ref 1.4–7.7)
Neutrophils Relative %: 61.1 % (ref 43.0–77.0)
Platelets: 158 10*3/uL (ref 150.0–400.0)
RBC: 4.96 Mil/uL (ref 4.22–5.81)
RDW: 12.8 % (ref 11.5–15.5)
WBC: 7.8 10*3/uL (ref 4.0–10.5)

## 2019-09-24 LAB — BASIC METABOLIC PANEL
BUN: 11 mg/dL (ref 6–23)
CO2: 27 mEq/L (ref 19–32)
Calcium: 9.8 mg/dL (ref 8.4–10.5)
Chloride: 99 mEq/L (ref 96–112)
Creatinine, Ser: 0.88 mg/dL (ref 0.40–1.50)
GFR: 89.58 mL/min (ref >60.00)
Glucose, Bld: 154 mg/dL — ABNORMAL HIGH (ref 70–99)
Potassium: 4.6 mEq/L (ref 3.5–5.1)
Sodium: 135 mEq/L (ref 135–145)

## 2019-09-24 LAB — LDL CHOLESTEROL, DIRECT: Direct LDL: 76 mg/dL

## 2019-09-24 LAB — HEMOGLOBIN A1C: Hgb A1c MFr Bld: 7.3 % — ABNORMAL HIGH (ref 4.6–6.5)

## 2019-09-24 LAB — TSH: TSH: 1.05 u[IU]/mL (ref 0.35–4.50)

## 2019-09-24 NOTE — Assessment & Plan Note (Signed)
Chronic problem.  On Jardiance daily.  Pt will have some dizziness if he takes medication and doesn't eat.  Encouraged him to eat breakfast regularly.  Pt to schedule eye exam.  UTD on foot exam.  On ACE.  Check labs.  Adjust meds prn

## 2019-09-24 NOTE — Patient Instructions (Signed)
Follow up in 3-4 months to recheck diabetes We'll notify you of your lab results and make any changes if needed Continue to work on healthy diet and regular exercise- you're doing great! Call with any questions or concerns Have a great summer!!  

## 2019-09-24 NOTE — Assessment & Plan Note (Signed)
Following w/ Cards.  Currently asymptomatic.  Due for f/u appt.

## 2019-09-24 NOTE — Progress Notes (Signed)
   Subjective:    Patient ID: [name], male    DOB: 07/23/1965, 54 y.o.   MRN: [id number]  HPI DM- chronic problem, last A1C 7.3  On ACE for renal protection.  UTD on foot exam.  Due for eye exam (pt to schedule).  + increased urination (on Jardiance and Spironolactone)  No numbness/tingling of hands/feet.  Will have symptomatic lows if he doesn't eat after medication.  HTN- chronic problem, on Coreg 12.5mg  BID, Lisinopril 20mg  daily, Spironolactone 25mg  daily w/ good control.  Denies CP, SOB, HAs, visual changes, edema.  Hypertriglyceridemia- chronic problem, on Fenofibrate 160mg  daily and Lipitor 10mg  daily.  No abd pain, N/V.  CHF- chronic problem, following w/ Cards.  On Eliquis, Coreg, Lisinopril, Spironolactone   Review of Systems For ROS see HPI   This visit occurred during the SARS-CoV-2 public health emergency.  Safety protocols were in place, including screening questions prior to the visit, additional usage of staff PPE, and extensive cleaning of exam room while observing appropriate contact time as indicated for disinfecting solutions.       Objective:   Physical Exam Vitals reviewed.  Constitutional:      General: He is not in acute distress.    Appearance: Normal appearance. He is well-developed.  HENT:     Head: Normocephalic and atraumatic.  Eyes:     Conjunctiva/sclera: Conjunctivae normal.     Pupils: Pupils are equal, round, and reactive to light.  Neck:     Thyroid: No thyromegaly.  Cardiovascular:     Rate and Rhythm: Normal rate and regular rhythm.     Heart sounds: Normal heart sounds. No murmur heard.   Pulmonary:     Effort: Pulmonary effort is normal. No respiratory distress.     Breath sounds: Normal breath sounds.  Abdominal:     General: Bowel sounds are normal. There is no distension.     Palpations: Abdomen is soft.  Musculoskeletal:     Cervical back: Normal range of motion and neck supple.  Lymphadenopathy:     Cervical: No  cervical adenopathy.  Skin:    General: Skin is warm and dry.  Neurological:     Mental Status: He is alert and oriented to person, place, and time.     Cranial Nerves: No cranial nerve deficit.  Psychiatric:        Behavior: Behavior normal.           Assessment & Plan:

## 2019-09-24 NOTE — Assessment & Plan Note (Signed)
Chronic problem.  Well controlled.  Asymptomatic.  Check labs.  No anticipated med changes.  Will follow. 

## 2019-09-24 NOTE — Assessment & Plan Note (Signed)
Chronic problem.  Tolerating medication w/o difficulty.  Check labs.  Adjust meds prn

## 2019-09-25 ENCOUNTER — Encounter: Payer: Self-pay | Admitting: General Practice

## 2019-11-07 ENCOUNTER — Other Ambulatory Visit: Payer: Self-pay | Admitting: Family Medicine

## 2019-12-01 ENCOUNTER — Other Ambulatory Visit (HOSPITAL_COMMUNITY): Payer: Self-pay | Admitting: Cardiology

## 2019-12-01 DIAGNOSIS — I5022 Chronic systolic (congestive) heart failure: Secondary | ICD-10-CM

## 2020-01-01 ENCOUNTER — Ambulatory Visit: Payer: BC Managed Care – PPO | Admitting: Family Medicine

## 2020-01-22 ENCOUNTER — Other Ambulatory Visit: Payer: Self-pay | Admitting: Family Medicine

## 2020-02-07 ENCOUNTER — Other Ambulatory Visit: Payer: Self-pay

## 2020-02-07 ENCOUNTER — Encounter: Payer: Self-pay | Admitting: Family Medicine

## 2020-02-07 ENCOUNTER — Ambulatory Visit: Payer: BC Managed Care – PPO | Admitting: Family Medicine

## 2020-02-07 ENCOUNTER — Other Ambulatory Visit (INDEPENDENT_AMBULATORY_CARE_PROVIDER_SITE_OTHER): Payer: BC Managed Care – PPO

## 2020-02-07 VITALS — BP 130/78 | HR 79 | Temp 97.2°F | Resp 20 | Ht 68.0 in | Wt 182.2 lb

## 2020-02-07 DIAGNOSIS — E119 Type 2 diabetes mellitus without complications: Secondary | ICD-10-CM | POA: Diagnosis not present

## 2020-02-07 DIAGNOSIS — Z23 Encounter for immunization: Secondary | ICD-10-CM

## 2020-02-07 LAB — BASIC METABOLIC PANEL
BUN: 16 mg/dL (ref 6–23)
CO2: 27 mEq/L (ref 19–32)
Calcium: 9.7 mg/dL (ref 8.4–10.5)
Chloride: 101 mEq/L (ref 96–112)
Creatinine, Ser: 1.03 mg/dL (ref 0.40–1.50)
GFR: 81.28 mL/min (ref >60.00)
Glucose, Bld: 123 mg/dL — ABNORMAL HIGH (ref 70–99)
Potassium: 4.4 mEq/L (ref 3.5–5.1)
Sodium: 136 mEq/L (ref 135–145)

## 2020-02-07 LAB — HEMOGLOBIN A1C: Hgb A1c MFr Bld: 7.5 % — ABNORMAL HIGH (ref 4.6–6.5)

## 2020-02-07 NOTE — Progress Notes (Signed)
° °  Subjective:    Patient ID: Matthew Green, male    DOB: December 30, 1965, 54 y.o.   MRN: [id number]  HPI DM- chronic problem.  Last A1C 7.3% on Jardiance.  On Lisinopril for renal protection.  UTD on foot exam.  Due for eye exam.  'I haven't felt this good in a long time'.  No CP, SOB, HAs, visual changes, abd pain, N/V, no numbness/tingling of hands/feet.  Denies symptomatic lows.  Will get flu shot today.  Has not had COVID vaccines   Review of Systems For ROS see HPI   This visit occurred during the SARS-CoV-2 public health emergency.  Safety protocols were in place, including screening questions prior to the visit, additional usage of staff PPE, and extensive cleaning of exam room while observing appropriate contact time as indicated for disinfecting solutions.       Objective:   Physical Exam Vitals reviewed.  Constitutional:      General: He is not in acute distress.    Appearance: Normal appearance. He is well-developed.  HENT:     Head: Normocephalic and atraumatic.  Eyes:     Conjunctiva/sclera: Conjunctivae normal.     Pupils: Pupils are equal, round, and reactive to light.  Neck:     Thyroid: No thyromegaly.  Cardiovascular:     Rate and Rhythm: Normal rate and regular rhythm.     Pulses: Normal pulses.     Heart sounds: Normal heart sounds. No murmur heard.   Pulmonary:     Effort: Pulmonary effort is normal. No respiratory distress.     Breath sounds: Normal breath sounds.  Abdominal:     General: Bowel sounds are normal. There is no distension.     Palpations: Abdomen is soft.  Musculoskeletal:     Cervical back: Normal range of motion and neck supple.     Right lower leg: No edema.     Left lower leg: No edema.  Lymphadenopathy:     Cervical: No cervical adenopathy.  Skin:    General: Skin is warm and dry.  Neurological:     Mental Status: He is alert and oriented to person, place, and time.     Cranial Nerves: No cranial nerve deficit.  Psychiatric:         Behavior: Behavior normal.           Assessment & Plan:

## 2020-02-07 NOTE — Patient Instructions (Addendum)
Please schedule your complete physical in 3-4 months Either schedule a lab visit at your convenience or go to [facility] and get them drawn any time (across from [employer]) Continue to work on [employer] and regular exercise- you can do it! Call and schedule your eye exam Call with any questions or concerns Stay Safe!  Stay Healthy! Happy Holidays!

## 2020-02-07 NOTE — Assessment & Plan Note (Signed)
Ongoing issue for pt.  A1C has been adequately controlled.  Currently on Jardiance.  Asymptomatic.  States he will schedule eye exam.  Check labs.  Adjust meds prn

## 2020-02-12 ENCOUNTER — Other Ambulatory Visit (HOSPITAL_COMMUNITY): Payer: Self-pay | Admitting: Cardiology

## 2020-02-12 ENCOUNTER — Other Ambulatory Visit: Payer: Self-pay | Admitting: Family Medicine

## 2020-02-14 ENCOUNTER — Other Ambulatory Visit (HOSPITAL_COMMUNITY): Payer: Self-pay

## 2020-02-14 ENCOUNTER — Other Ambulatory Visit (HOSPITAL_COMMUNITY): Payer: Self-pay | Admitting: Cardiology

## 2020-02-26 ENCOUNTER — Other Ambulatory Visit (HOSPITAL_COMMUNITY): Payer: Self-pay | Admitting: Cardiology

## 2020-02-26 DIAGNOSIS — I5022 Chronic systolic (congestive) heart failure: Secondary | ICD-10-CM

## 2020-02-29 ENCOUNTER — Other Ambulatory Visit (HOSPITAL_COMMUNITY): Payer: Self-pay | Admitting: Cardiology

## 2020-03-19 ENCOUNTER — Other Ambulatory Visit (HOSPITAL_COMMUNITY): Payer: Self-pay | Admitting: Cardiology

## 2020-03-19 DIAGNOSIS — I5022 Chronic systolic (congestive) heart failure: Secondary | ICD-10-CM

## 2020-04-09 LAB — HM DIABETES EYE EXAM

## 2020-04-24 ENCOUNTER — Other Ambulatory Visit (HOSPITAL_COMMUNITY): Payer: Self-pay | Admitting: Cardiology

## 2020-04-24 DIAGNOSIS — I5022 Chronic systolic (congestive) heart failure: Secondary | ICD-10-CM

## 2020-05-01 ENCOUNTER — Other Ambulatory Visit: Payer: Self-pay | Admitting: Family Medicine

## 2020-05-09 ENCOUNTER — Other Ambulatory Visit (HOSPITAL_COMMUNITY): Payer: Self-pay | Admitting: Cardiology

## 2020-05-12 ENCOUNTER — Telehealth (HOSPITAL_COMMUNITY): Payer: Self-pay | Admitting: Cardiology

## 2020-05-12 DIAGNOSIS — I5022 Chronic systolic (congestive) heart failure: Secondary | ICD-10-CM

## 2020-05-12 MED ORDER — APIXABAN 2.5 MG PO TABS: ORAL_TABLET | ORAL | 2 refills | Status: DC

## 2020-05-12 MED ORDER — SPIRONOLACTONE 25 MG PO TABS
ORAL_TABLET | ORAL | 0 refills | Status: DC
Start: 2020-05-12 — End: 2020-09-11

## 2020-05-12 MED ORDER — CARVEDILOL 12.5 MG PO TABS
12.5000 mg | ORAL_TABLET | Freq: Two times a day (BID) | ORAL | 0 refills | Status: DC
Start: 2020-05-12 — End: 2020-08-03

## 2020-05-12 NOTE — Telephone Encounter (Signed)
Pt request refills on all medications that he needs to get him by until next appt 04/15, pt can be reached [phone]. Thanks

## 2020-06-04 ENCOUNTER — Other Ambulatory Visit: Payer: Self-pay

## 2020-06-05 ENCOUNTER — Encounter: Payer: Self-pay | Admitting: Family Medicine

## 2020-06-05 ENCOUNTER — Ambulatory Visit (INDEPENDENT_AMBULATORY_CARE_PROVIDER_SITE_OTHER): Payer: BC Managed Care – PPO | Admitting: Family Medicine

## 2020-06-05 VITALS — BP 128/80 | HR 86 | Temp 97.3°F | Resp 18 | Ht 68.0 in | Wt 185.0 lb

## 2020-06-05 DIAGNOSIS — E119 Type 2 diabetes mellitus without complications: Secondary | ICD-10-CM

## 2020-06-05 DIAGNOSIS — E781 Pure hyperglyceridemia: Secondary | ICD-10-CM

## 2020-06-05 DIAGNOSIS — Z0000 Encounter for general adult medical examination without abnormal findings: Secondary | ICD-10-CM | POA: Diagnosis not present

## 2020-06-05 DIAGNOSIS — Z125 Encounter for screening for malignant neoplasm of prostate: Secondary | ICD-10-CM | POA: Diagnosis not present

## 2020-06-05 LAB — HEPATIC FUNCTION PANEL
ALT: 21 U/L (ref 0–53)
AST: 18 U/L (ref 0–37)
Albumin: 4.5 g/dL (ref 3.5–5.2)
Alkaline Phosphatase: 55 U/L (ref 39–117)
Bilirubin, Direct: 0.1 mg/dL (ref 0.0–0.3)
Total Bilirubin: 0.4 mg/dL (ref 0.2–1.2)
Total Protein: 7.6 g/dL (ref 6.0–8.3)

## 2020-06-05 LAB — BASIC METABOLIC PANEL
BUN: 15 mg/dL (ref 6–23)
CO2: 25 mEq/L (ref 19–32)
Calcium: 9.7 mg/dL (ref 8.4–10.5)
Chloride: 100 mEq/L (ref 96–112)
Creatinine, Ser: 1.07 mg/dL (ref 0.40–1.50)
GFR: 77.47 mL/min (ref >60.00)
Glucose, Bld: 149 mg/dL — ABNORMAL HIGH (ref 70–99)
Potassium: 4.3 mEq/L (ref 3.5–5.1)
Sodium: 135 mEq/L (ref 135–145)

## 2020-06-05 LAB — LIPID PANEL
Cholesterol: 170 mg/dL (ref 0–200)
HDL: 29.3 mg/dL — ABNORMAL LOW (ref >39.00)
NonHDL: 141.02
Total CHOL/HDL Ratio: 6
Triglycerides: 203 mg/dL — ABNORMAL HIGH (ref 0.0–149.0)
VLDL: 40.6 mg/dL — ABNORMAL HIGH (ref 0.0–40.0)

## 2020-06-05 LAB — HEMOGLOBIN A1C: Hgb A1c MFr Bld: 7.4 % — ABNORMAL HIGH (ref 4.6–6.5)

## 2020-06-05 LAB — CBC WITH DIFFERENTIAL/PLATELET
Basophils Absolute: 0.1 10*3/uL (ref 0.0–0.1)
Basophils Relative: 0.9 % (ref 0.0–3.0)
Eosinophils Absolute: 0.3 10*3/uL (ref 0.0–0.7)
Eosinophils Relative: 4.2 % (ref 0.0–5.0)
HCT: 46.9 % (ref 39.0–52.0)
Hemoglobin: 16.2 g/dL (ref 13.0–17.0)
Lymphocytes Relative: 30.5 % (ref 12.0–46.0)
Lymphs Abs: 2.4 10*3/uL (ref 0.7–4.0)
MCHC: 34.6 g/dL (ref 30.0–36.0)
MCV: 96.1 fl (ref 78.0–100.0)
Monocytes Absolute: 0.4 10*3/uL (ref 0.1–1.0)
Monocytes Relative: 5 % (ref 3.0–12.0)
Neutro Abs: 4.7 10*3/uL (ref 1.4–7.7)
Neutrophils Relative %: 59.4 % (ref 43.0–77.0)
Platelets: 203 10*3/uL (ref 150.0–400.0)
RBC: 4.88 Mil/uL (ref 4.22–5.81)
RDW: 12.3 % (ref 11.5–15.5)
WBC: 7.8 10*3/uL (ref 4.0–10.5)

## 2020-06-05 LAB — TSH: TSH: 0.89 u[IU]/mL (ref 0.35–4.50)

## 2020-06-05 LAB — LDL CHOLESTEROL, DIRECT: Direct LDL: 115 mg/dL

## 2020-06-05 LAB — PSA: PSA: 0.53 ng/mL (ref 0.10–4.00)

## 2020-06-05 MED ORDER — EPINEPHRINE 0.3 MG/0.3ML IJ SOAJ: INTRAMUSCULAR | 3 refills | Status: DC

## 2020-06-05 MED ORDER — ROSUVASTATIN CALCIUM 5 MG PO TABS: 5.0000 mg | ORAL_TABLET | Freq: Every day | ORAL | 1 refills | Status: DC

## 2020-06-05 NOTE — Patient Instructions (Addendum)
Follow up in 3-4 months to recheck diabetes We'll notify you of your lab results and make any changes if needed STOP the Atorvastatin START the Rosuvastatin nightly Continue to work on healthy diet and regular exercise- you can do it! Call with any questions or concerns Have a great weekend!!!

## 2020-06-05 NOTE — Assessment & Plan Note (Signed)
Pt's PE WNL w/ exception of being overweight.  UTD on colonoscopy, Tdap, flu, Pneumovax.  Declines COVID.  Check labs.  Anticipatory guidance provided.

## 2020-06-05 NOTE — Assessment & Plan Note (Signed)
Ongoing issue for pt.  Having myalgias w/ Atorvastatin.  Will switch to low dose Crestor and see if pt can tolerate.  Pt expressed understanding and is in agreement w/ plan.

## 2020-06-05 NOTE — Assessment & Plan Note (Signed)
Chronic problem.  UTD on eye exam.  On ACE for renal protection.  Foot exam done today.  Check labs.  Adjust meds prn

## 2020-06-05 NOTE — Progress Notes (Signed)
   Subjective:    Patient ID: Matthew Green, male    DOB: 03-Nov-1965, 55 y.o.   MRN: [id number]  HPI CPE- UTD on colonoscopy, UTD on eye exam (done in Feb).  Due for foot exam.  UTD on Tdap, flu, pneumovax.  + myalgias w/ Atorvastatin.  Stopped medication temporarily and sxs improved.  Restarted med and sxs returned.  Now taking sporadically.    Reviewed past medical, surgical, family and social histories.   Health Maintenance  Topic Date Due  . OPHTHALMOLOGY EXAM  05/28/2019  . FOOT EXAM  05/30/2020  . COVID-19 Vaccine (1) 06/21/2020 (Originally 09/20/1968)  . HIV Screening  02/06/2021 (Originally 09/21/1978)  . HEMOGLOBIN A1C  08/06/2020  . Fecal DNA (Cologuard)  03/04/2022  . TETANUS/TDAP  06/03/2028  . INFLUENZA VACCINE  Completed  . PNEUMOCOCCAL POLYSACCHARIDE VACCINE AGE 39-64 HIGH RISK  Completed  . Hepatitis C Screening  Completed  . HPV VACCINES  Aged Out    Patient Care Team    Relationship Specialty Notifications Start End  Midge Minium, MD PCP - General Family Medicine  04/03/14   Larey Dresser, MD PCP - Advanced Heart Failure Cardiology Admissions 04/12/19   Mosetta Anis, MD Referring Physician Allergy  11/11/16   Thelma Comp, [place] Physician Optometry  11/11/16       Review of Systems Patient reports no vision/hearing changes, anorexia, fever ,adenopathy, persistant/recurrent hoarseness, swallowing issues, chest pain, palpitations, edema, persistant/recurrent cough, hemoptysis, dyspnea (rest,exertional, paroxysmal nocturnal), gastrointestinal  bleeding (melena, rectal bleeding), abdominal pain, excessive heart burn, GU symptoms (dysuria, hematuria, voiding/incontinence issues) syncope, focal weakness, memory loss, numbness & tingling, skin/hair/nail changes, depression, anxiety, abnormal bruising/bleeding, musculoskeletal symptoms/signs.   This visit occurred during the SARS-CoV-2 public health emergency.  Safety protocols were in place,  including screening questions prior to the visit, additional usage of staff PPE, and extensive cleaning of exam room while observing appropriate contact time as indicated for disinfecting solutions.       Objective:   Physical Exam General Appearance:    Alert, cooperative, no distress, appears stated age  Head:    Normocephalic, without obvious abnormality, atraumatic  Eyes:    PERRL, conjunctiva/corneas clear, EOM's intact, fundi    benign, both eyes       Ears:    Normal TM's and external ear canals, both ears  Nose:   Deferred due to COVID  Throat:   Neck:   Supple, symmetrical, trachea midline, no adenopathy;       thyroid:  No enlargement/tenderness/nodules  Back:     Symmetric, no curvature, ROM normal, no CVA tenderness  Lungs:     Clear to auscultation bilaterally, respirations unlabored  Chest wall:    No tenderness or deformity  Heart:    Regular rate and rhythm, S1 and S2 normal, no murmur, rub   or gallop  Abdomen:     Soft, non-tender, bowel sounds active all four quadrants,    no masses, no organomegaly  Genitalia:    deferred  Rectal:    Extremities:   Extremities normal, atraumatic, no cyanosis or edema  Pulses:   2+ and symmetric all extremities  Skin:   Skin color, texture, turgor normal, no rashes or lesions  Lymph nodes:   Cervical, supraclavicular, and axillary nodes normal  Neurologic:   CNII-XII intact. Normal strength, sensation and reflexes      throughout          Assessment & Plan:

## 2020-07-08 DIAGNOSIS — D2271 Melanocytic nevi of right lower limb, including hip: Secondary | ICD-10-CM | POA: Diagnosis not present

## 2020-07-08 DIAGNOSIS — L821 Other seborrheic keratosis: Secondary | ICD-10-CM | POA: Diagnosis not present

## 2020-07-08 DIAGNOSIS — C44319 Basal cell carcinoma of skin of other parts of face: Secondary | ICD-10-CM | POA: Diagnosis not present

## 2020-07-08 DIAGNOSIS — L578 Other skin changes due to chronic exposure to nonionizing radiation: Secondary | ICD-10-CM | POA: Diagnosis not present

## 2020-07-08 DIAGNOSIS — L570 Actinic keratosis: Secondary | ICD-10-CM | POA: Diagnosis not present

## 2020-07-08 DIAGNOSIS — D225 Melanocytic nevi of trunk: Secondary | ICD-10-CM | POA: Diagnosis not present

## 2020-07-10 ENCOUNTER — Other Ambulatory Visit: Payer: Self-pay

## 2020-07-10 ENCOUNTER — Encounter (HOSPITAL_COMMUNITY): Payer: Self-pay | Admitting: Cardiology

## 2020-07-10 ENCOUNTER — Ambulatory Visit (HOSPITAL_COMMUNITY)
Admission: RE | Admit: 2020-07-10 | Discharge: 2020-07-10 | Disposition: A | Discharge status: Home / Self Care | Payer: BC Managed Care – PPO | Source: Ambulatory Visit | Attending: Cardiology | Admitting: Cardiology

## 2020-07-10 VITALS — BP 140/80 | HR 92 | Wt 185.5 lb

## 2020-07-10 DIAGNOSIS — I5022 Chronic systolic (congestive) heart failure: Secondary | ICD-10-CM | POA: Insufficient documentation

## 2020-07-10 DIAGNOSIS — Z8249 Family history of ischemic heart disease and other diseases of the circulatory system: Secondary | ICD-10-CM | POA: Diagnosis not present

## 2020-07-10 DIAGNOSIS — E785 Hyperlipidemia, unspecified: Secondary | ICD-10-CM | POA: Diagnosis not present

## 2020-07-10 DIAGNOSIS — I2699 Other pulmonary embolism without acute cor pulmonale: Secondary | ICD-10-CM | POA: Insufficient documentation

## 2020-07-10 DIAGNOSIS — Z7289 Other problems related to lifestyle: Secondary | ICD-10-CM | POA: Insufficient documentation

## 2020-07-10 DIAGNOSIS — Z7901 Long term (current) use of anticoagulants: Secondary | ICD-10-CM | POA: Diagnosis not present

## 2020-07-10 DIAGNOSIS — I429 Cardiomyopathy, unspecified: Secondary | ICD-10-CM | POA: Diagnosis not present

## 2020-07-10 DIAGNOSIS — Z79899 Other long term (current) drug therapy: Secondary | ICD-10-CM | POA: Insufficient documentation

## 2020-07-10 DIAGNOSIS — Z87891 Personal history of nicotine dependence: Secondary | ICD-10-CM | POA: Insufficient documentation

## 2020-07-10 NOTE — Patient Instructions (Signed)
EKG done today.  No Labs done today.   No medication changes were made. Please continue all current medications as prescribed.  Your physician recommends that you schedule a follow-up appointment in: 1 year. Please contact our office in March 2023 for an April 2023 appointment.   If you have any questions or concerns before your next appointment please send [place] a message through [place] or call our office at [phone].    TO LEAVE A MESSAGE FOR THE NURSE SELECT OPTION 2, PLEASE LEAVE A MESSAGE INCLUDING: . YOUR NAME . DATE OF BIRTH . CALL BACK NUMBER . REASON FOR CALL**this is important as we prioritize the call backs  YOU WILL RECEIVE A CALL BACK THE SAME DAY AS LONG AS YOU CALL BEFORE 4:00 PM   Do the following things EVERYDAY: 1) Weigh yourself in the morning before breakfast. Write it down and keep it in a log. 2) Take your medicines as prescribed 3) Eat low salt foods--Limit salt (sodium) to 2000 mg per day.  4) Stay as active as you can everyday 5) Limit all fluids for the day to less than 2 liters   At the [facility], you and your health needs are our priority. As part of our continuing mission to provide you with exceptional heart care, we have created designated Provider Care Teams. These Care Teams include your primary Cardiologist (physician) and Advanced Practice Providers (APPs- Physician Assistants and Nurse Practitioners) who all work together to provide you with the care you need, when you need it.   You may see any of the following providers on your designated Care Team at your next follow up: Marland Kitchen Dr Glori Bickers . Dr [name] . Darrick Grinder, NP . Lyda Jester, PA . Audry Riles, PharmD   Please be sure to bring in all your medications bottles to every appointment.

## 2020-07-12 NOTE — Progress Notes (Signed)
Date:  07/12/2020   ID:  Matthew Green, DOB September 30, 1965, MRN [id number]   Provider location: [address] [place] Type of Visit: Established patient  PCP:  Midge Minium, MD  Cardiologist: Dr. Aundra Dubin   History of Present Illness: Matthew Green is a 55 y.o. male who has history of tobacco use and heavy ETOH use developed exertional dyspnea and orthopnea about 3 weeks prior to admission to [facility] in 12/15.  No chest pain.  He was admitted 03/21/14 and found to have a PE.  Lower extremity US showed no DVT.  He had no known trigger: no surgery or prolonged immobility.  His father had a DVT after a leg injury.  As part of the workup, he had an echo showing EF 20-25% with diffuse hypokinesis.  He was noted to be volume overloaded and was diuresed.  He was started on apixaban.  His brother and multiple family members on his mother's side have a history of cardiomyopathy.  Of note, he has been a heavy drinker, up to 12 beers/day, since his divorce.  He was also a heavy smoker until around the time of his CHF admission, when he quit.  Echo was repeated in 4/16, showing EF 50-55% with trace MR.    Echo in 6/20 showed EF 55%, normal RV.   He returns for followup of cardiomyopathy. He continues to do very well.  He is working full time, able to do everything he wants without exertional dyspnea or chest pain.  No lightheadedness/palpitations.  No orthopnea/PND.  He had myalgias with atorvastatin and subsequently switched to Crestor, no problems so far with Crestor.    Labs  (03/25/14): K 4.6, creatinine 1.29, HCT 43.5, BNP 987, TnI 0.05, AST/ALT normal, LDL 64, TSH normal, lupus anticoagulant negative, beta-2 glycoprotein negative, anticardiolipin antibody negative. Factor V Leiden homozygote.  Negative for prothrombin gene mutation.  (03/27/14): Dig level 0.7  (1/16): K 4.2, creatinine 0.9, LFTs normal (3/16): K 3.7, creatinine 1.02, BNP 82 (4/16): K 4.2, creatinine 0.92,  digoxin 0.3 (8/17): K 5, creatinine 0.87, LDL 63, TGs 484, HDL 35 (8/18): K 4.2, creatinine 1.05, hgb 14.7, TGs 187, LDL 97, HDL 29 (9/19): K 4.2, creatinine 1.05, LDL 85, TGs 342 (3/22): LDL 115, K 4.3, creatinine 1.01  PMH: 1. PE: Diagnosed by CTA in 12/15. Lower extremity US was negative.  There was no trigger.  Lupus anticoagulant negative, beta-2 glycoprotein negative, anticardiolipin antibody negative, prothrombin gene mutation negative.  Factor V Leiden heterozygote.  2. Cardiomyopathy: Echo (12/15) with EF 20-25%, mildly dilated LV with diffuse hypokinesis, RV mildly dilated with moderately decreased systolic function, PA systolic pressure 44 mmHg. Cardiac MRI (1/16) with LV EF 34%, global hypokinesis; RV moderately hypokinetic, EF 30%, possible subtle mid-wall LGE in the anterior wall (not a coronary pattern).  Echo (4/16) with EF 50-55%, trace MR.  - Echo (12/17): EF 55-60%, normal RV.  - Echo (6/20): EF 55%, normal RV 3. 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  4. Hyperlipidemia: Myalgias with atorvastatin.    Current Outpatient Medications  Medication Sig Dispense Refill  . apixaban (ELIQUIS) 2.5 MG TABS tablet TAKE 1 TABLET (2.5 MG TOTAL) BY MOUTH 2 (TWO) TIMES DAILY. 60 tablet 2  . Blood Glucose Monitoring Suppl (CONTOUR NEXT MONITOR) w/Device KIT 1 Units by Does not apply route daily. Check blood sugars once daily Dx:E11.9 1 kit 0  . carvedilol (COREG) 12.5  MG tablet Take 1 tablet (12.5 mg total) by mouth 2 (two) times daily with a meal. 180 tablet 0  . CONTOUR NEXT TEST test strip CHECK BLOOD SUGAR ONCE DAILY DX: E11.9 100 each 12  . EPINEPHrine 0.3 mg/0.3 mL IJ SOAJ injection USE AS DIRECTED ON PACKAGE AS NEEDED FOR ALLERGIC REACTION 1 each 3  . fenofibrate 160 MG tablet TAKE 1 TABLET BY MOUTH EVERY DAY 90 tablet 1  . JARDIANCE 10 MG TABS tablet TAKE 1 TABLET BY MOUTH EVERY DAY 90 tablet 1  . lisinopril (ZESTRIL) 20 MG  tablet TAKE 1 TABLET (20 MG TOTAL) BY MOUTH DAILY. NEEDS APPT FOR FURTHER REFILLS 90 tablet 0  . rosuvastatin (CRESTOR) 5 MG tablet Take 1 tablet (5 mg total) by mouth daily. 90 tablet 1  . spironolactone (ALDACTONE) 25 MG tablet TAKE 1 TABLET (25 MG TOTAL) BY MOUTH DAILY. 90 tablet 0   No current facility-administered medications for this encounter.    Allergies:   Other   Social History:  The patient  reports that he quit smoking about 6 years ago. His smoking use included cigarettes. His smokeless tobacco use includes snuff. He reports current alcohol use. He reports that he does not use drugs.   Family History:  The patient's family history includes Deep vein thrombosis in his father; Heart failure in his brother and mother.   ROS:  Please see the history of present illness.   All other systems are personally reviewed and negative.   Exam:   BP 140/80   Pulse 92   Wt 84.1 kg (185 lb 8 oz)   SpO2 96%   BMI 28.21 kg/m  General: NAD Neck: No JVD, no thyromegaly or thyroid nodule.  Lungs: Clear to auscultation bilaterally with normal respiratory effort. CV: Nondisplaced PMI.  Heart regular S1/S2, no S3/S4, no murmur.  No peripheral edema.  No carotid bruit.  Normal pedal pulses.  Abdomen: Soft, nontender, no hepatosplenomegaly, no distention.  Skin: Intact without lesions or rashes.  Neurologic: Alert and oriented x 3.  Psych: Normal affect. Extremities: No clubbing or cyanosis.  HEENT: Normal.   Recent Labs: 06/05/2020: ALT 21; BUN 15; Creatinine, Ser 1.07; Hemoglobin 16.2; Platelets 203.0; Potassium 4.3; Sodium 135; TSH 0.89  Personally reviewed   Wt Readings from Last 3 Encounters:  07/10/20 84.1 kg (185 lb 8 oz)  06/05/20 83.9 kg (185 lb)  02/07/20 82.6 kg (182 lb 3.2 oz)     ASSESSMENT AND PLAN:  1. PE: Patient developed a PE with no obvious trigger. His father had a DVT but it seems to have happened after a leg injury; brother had DVT. He is a Factor V Leiden  heterozygote. - Given spontaneous DVT with Factor V Leiden heterozygosity, would be reasonable to continue apixaban 2.5 mg bid long-term given no problems with bleeding.    2. Chronic systolic CHF: Suspect nonischemic cardiomyopathy.  Heavy ETOH use may have contributed to cardiomyopathy.  However, he does have a strong family history of CMP (brother, mother and members of mother's family).  This may be a familial cardiomyopathy. ECHO 03/22/14 EF 20-25%.  Cardiac MRI in 1/16 showed LV EF 34%, RV EF 30%.  There was subtle mid-wall anterior LGE.  This did not appear to be a CAD pattern. NYHA class I symptoms currently. Repeat echo in 4/16 showed EF improved considerably to 50-55%, suggesting that heavy ETOH may have played a large role with improvement after cutting back.  Most recent echo in 6/20 with  EF 55%.  - Continue current Coreg, spironolactone, and lisinopril.  Recent BMET was stable, would like to see him get BMETs every 3-4 months on this medication regimen.  3. Ureteral dilation: Noted on CT while in hospital.  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  4. ETOH: I encouraged him to keep ETOH to a minimum.   5. Hyperlipidemia: Continue fenofibrate and Crestor (myalgias with atorvastatin).   - Lipids/LFTs in 1 month.   Followup in 1 year.   Signed, [name], MD  07/12/2020  [facility] [address] [facility] [place] [address] [phone] (office) [phone] (fax)

## 2020-07-15 ENCOUNTER — Other Ambulatory Visit: Payer: Self-pay | Admitting: Family Medicine

## 2020-08-03 ENCOUNTER — Other Ambulatory Visit (HOSPITAL_COMMUNITY): Payer: Self-pay | Admitting: Cardiology

## 2020-08-06 ENCOUNTER — Other Ambulatory Visit: Payer: Self-pay | Admitting: Family Medicine

## 2020-08-06 ENCOUNTER — Encounter: Payer: Self-pay | Admitting: Family Medicine

## 2020-08-17 DIAGNOSIS — C44319 Basal cell carcinoma of skin of other parts of face: Secondary | ICD-10-CM | POA: Diagnosis not present

## 2020-08-17 IMAGING — US US THYROID
1 series · 13 of 25 positions shown · non-contrast
Comparison: None.

CLINICAL DATA: Thyroid nodule on exam

EXAM:
THYROID ULTRASOUND
TECHNIQUE: Ultrasound examination of the thyroid gland and adjacent soft
tissues was performed.

[Series 1: us thyroid · 0.07mm/px · 13 of 44 slices shown]
[im 1/44]
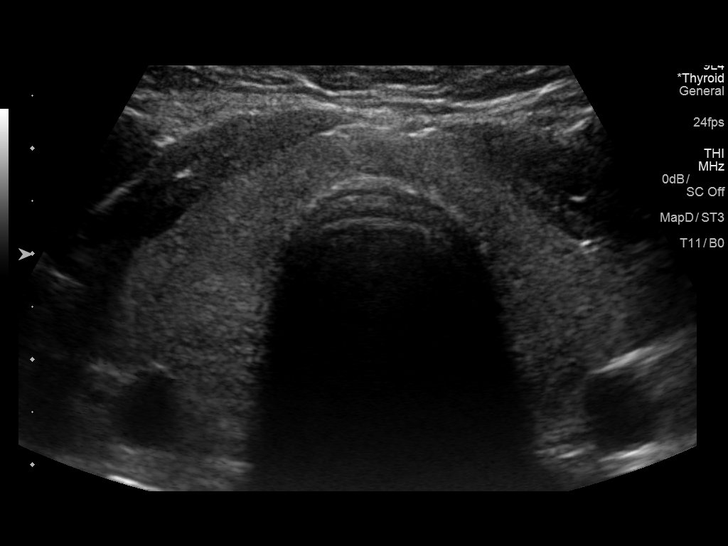
[im 4/44]
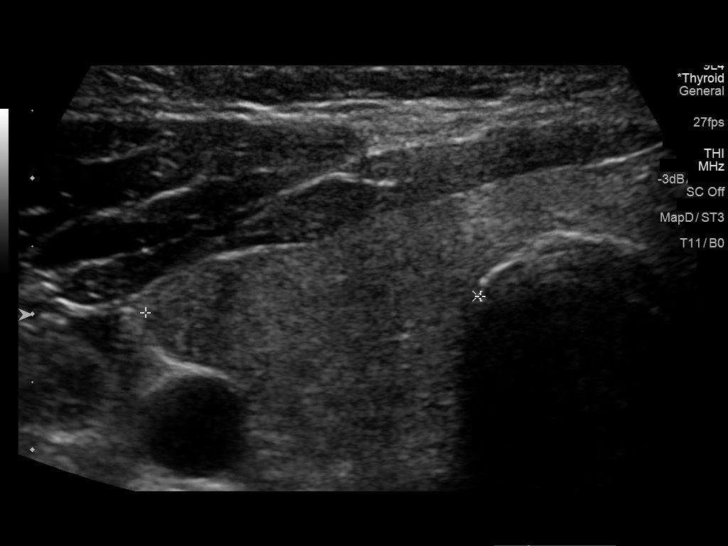
[im 8/44]
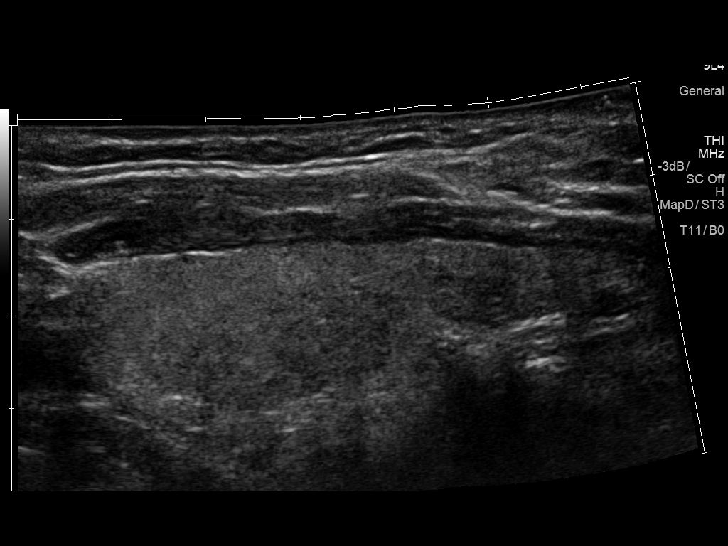
[im 11/44]
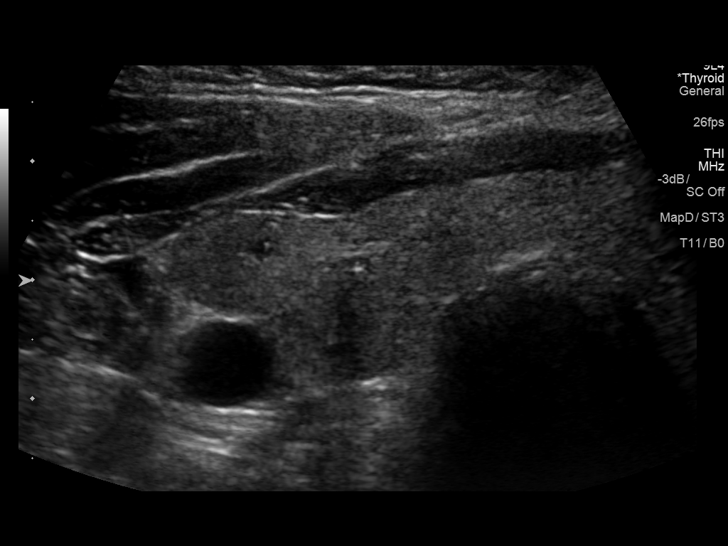
[im 15/44]
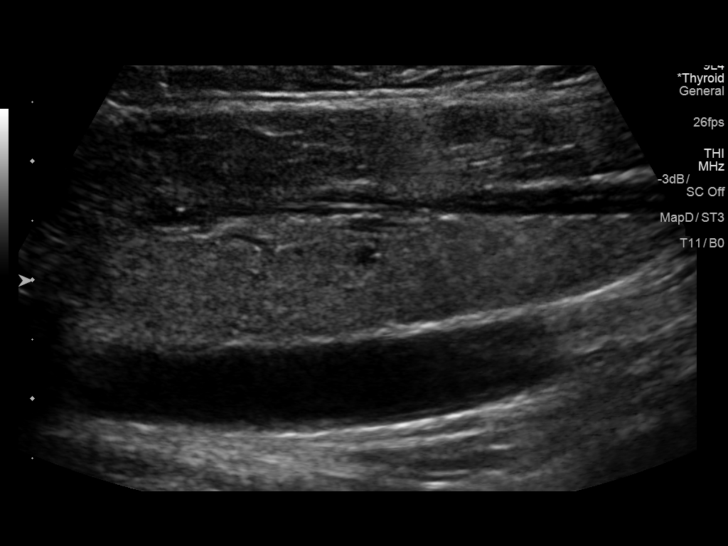
[im 18/44]
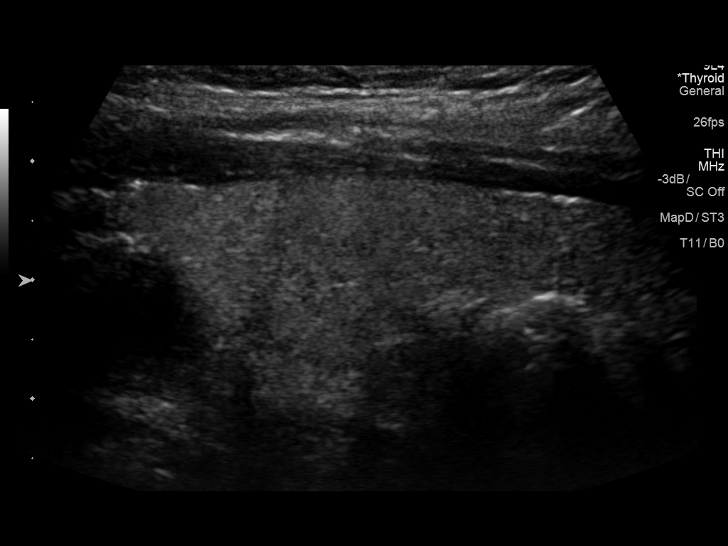
[im 22/44]
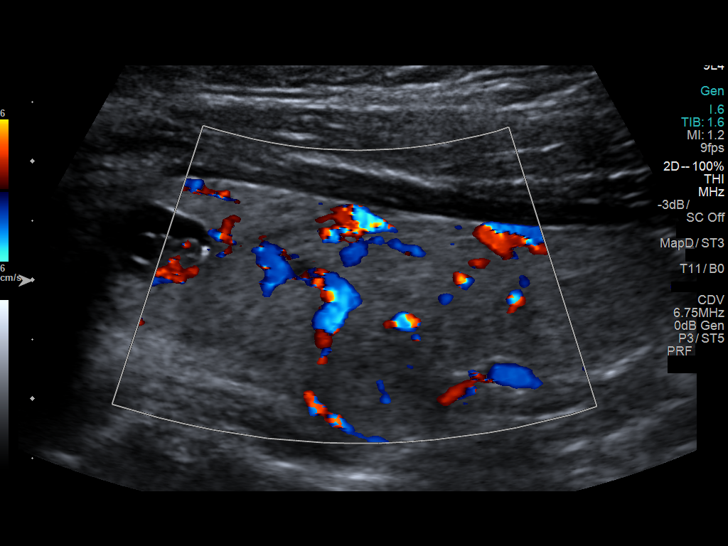
[im 26/44]
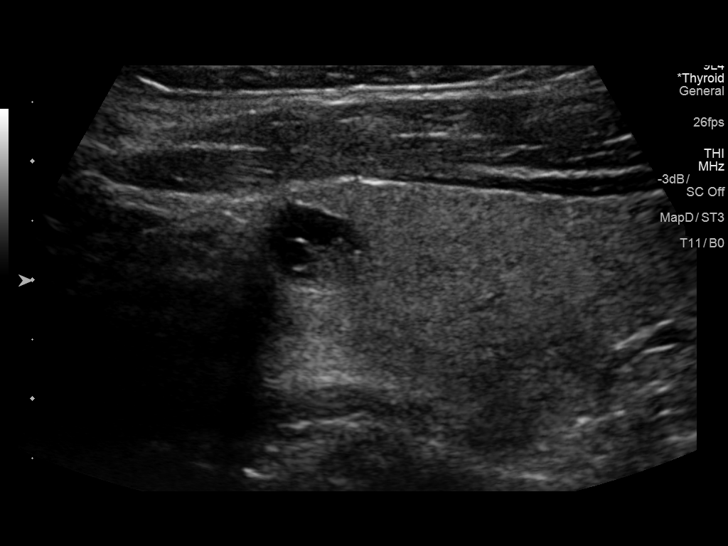
[im 29/44]
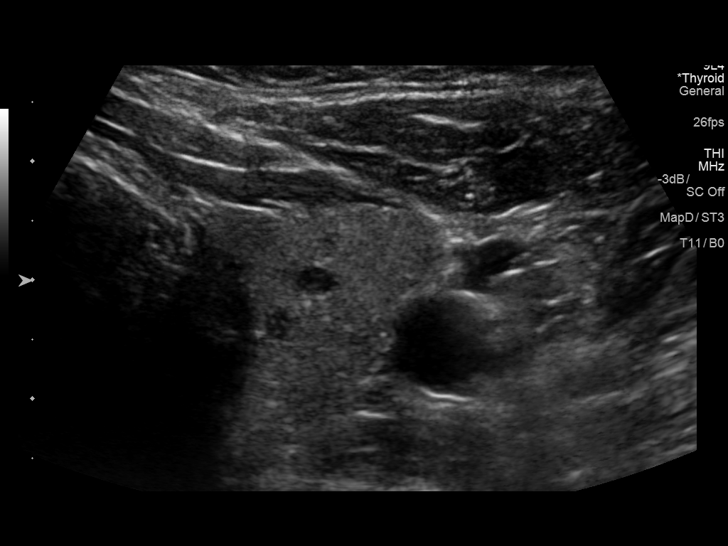
[im 33/44]
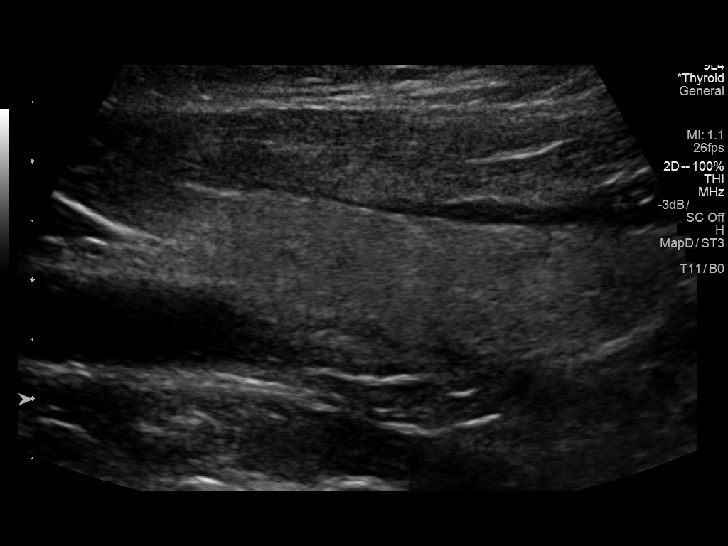
[im 36/44]
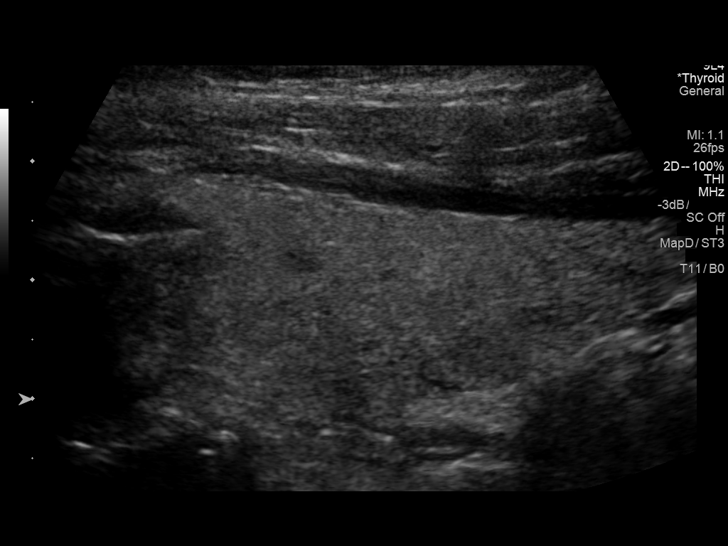
[im 40/44]
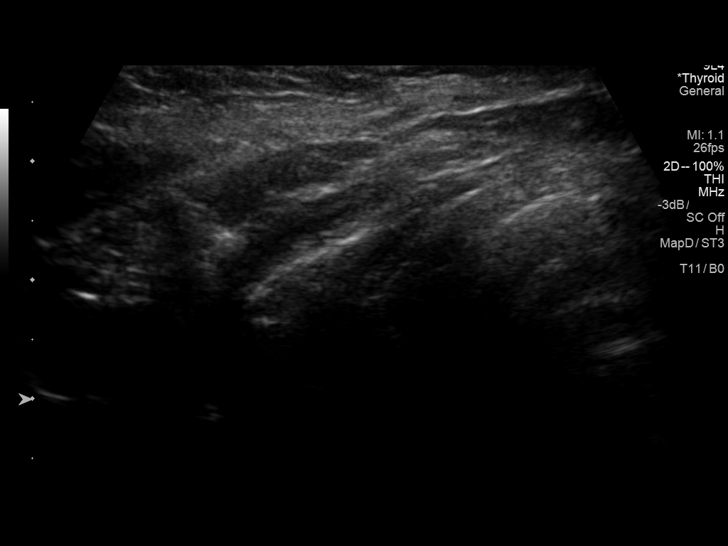
[im 44/44]
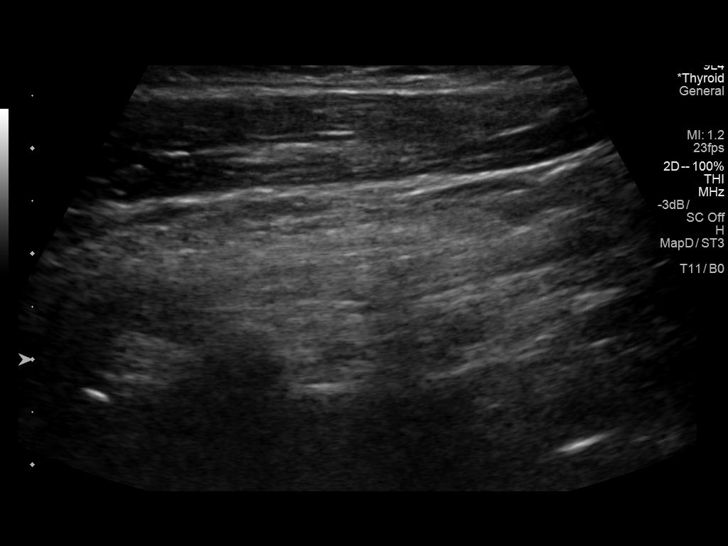

[13 of 25 positions shown; findings below may reference images not displayed]

FINDINGS: Parenchymal Echotexture: Normal

Isthmus: 1 cm

Right lobe: 6.1 x 1.8 x 2.5 cm

Left lobe: 5.2 x 1.7 x 2.0 cm

_________________________________________________________

Estimated total number of nodules >/= 1 cm: 0

Number of spongiform nodules >/=  2 cm not described below (TR1): 0

Number of mixed cystic and solid nodules >/= 1.5 cm not described
below (TR2): 0

_________________________________________________________

Nodule # 1:

Location: Left; Superior

Maximum size: 0.8 cm; Other 2 dimensions: 0.7 x 0.6 cm

Composition: mixed cystic and solid (1)

Echogenicity: isoechoic (1)

Shape: not taller-than-wide (0)

Margins: ill-defined (0)

Echogenic foci: none (0)

ACR TI-RADS total points: 2.

ACR TI-RADS risk category: TR2 (2 points).

ACR TI-RADS recommendations:

This nodule does NOT meet TI-RADS criteria for biopsy or dedicated
follow-up.

_________________________________________________________

There are a few scattered subcentimeter hypoechoic and cystic
nodules bilaterally, all measuring 5 mm or less. These would not
meet criteria for any biopsy or follow-up. Normal vascularity. No
adenopathy.
IMPRESSION: 0.8 cm left superior TR 2 nodule does not meet criteria for biopsy
or follow-up.

Additional benign-appearing subcentimeter nodules as above.

No significant thyroid abnormality.  No adenopathy.

The above is in keeping with the ACR TI-RADS recommendations - [HOSPITAL] 0480;[DATE].

## 2020-09-08 ENCOUNTER — Other Ambulatory Visit (HOSPITAL_COMMUNITY): Payer: Self-pay | Admitting: Cardiology

## 2020-09-08 DIAGNOSIS — I5022 Chronic systolic (congestive) heart failure: Secondary | ICD-10-CM

## 2020-09-11 ENCOUNTER — Other Ambulatory Visit (HOSPITAL_COMMUNITY): Payer: Self-pay | Admitting: Cardiology

## 2020-09-23 ENCOUNTER — Encounter: Payer: Self-pay | Admitting: *Deleted

## 2020-10-09 ENCOUNTER — Other Ambulatory Visit: Payer: Self-pay

## 2020-10-09 ENCOUNTER — Ambulatory Visit: Payer: BC Managed Care – PPO | Admitting: Family Medicine

## 2020-10-09 ENCOUNTER — Encounter: Payer: Self-pay | Admitting: Family Medicine

## 2020-10-09 VITALS — BP 128/80 | HR 81 | Temp 97.7°F | Resp 18 | Ht 68.0 in | Wt 184.2 lb

## 2020-10-09 DIAGNOSIS — E119 Type 2 diabetes mellitus without complications: Secondary | ICD-10-CM | POA: Diagnosis not present

## 2020-10-09 LAB — BASIC METABOLIC PANEL
BUN: 18 mg/dL (ref 6–23)
CO2: 25 mEq/L (ref 19–32)
Calcium: 9.5 mg/dL (ref 8.4–10.5)
Chloride: 99 mEq/L (ref 96–112)
Creatinine, Ser: 1.03 mg/dL (ref 0.40–1.50)
GFR: 80.9 mL/min (ref >60.00)
Glucose, Bld: 127 mg/dL — ABNORMAL HIGH (ref 70–99)
Potassium: 4.1 mEq/L (ref 3.5–5.1)
Sodium: 134 mEq/L — ABNORMAL LOW (ref 135–145)

## 2020-10-09 LAB — HEMOGLOBIN A1C: Hgb A1c MFr Bld: 7.3 % — ABNORMAL HIGH (ref 4.6–6.5)

## 2020-10-09 NOTE — Assessment & Plan Note (Signed)
Chronic problem.  On Jardiance daily w/o difficulty.  UTD on eye exam, foot exam, and on ACE for renal protection.  Encouraged low carb diet and regular exercise.  Check labs.  Adjust meds prn

## 2020-10-09 NOTE — Patient Instructions (Signed)
Follow up in 3-4 months to recheck diabetes and cholesterol We'll notify you of your lab results and make any changes if needed Continue to work on healthy diet and regular exercise- you're doing great! Call with any questions or concerns Stay Safe!  Stay Healthy! Have a great summer!!!

## 2020-10-09 NOTE — Progress Notes (Signed)
   Subjective:    Patient ID: [name], male    DOB: 1965/06/08, 55 y.o.   MRN: [id number]  HPI Diabetes- chronic problem, on Jardiance 10mg  daily w/ hx of good control.  'i feel excellent'.  On ACE for renal protection.  UTD on eye exam, foot exam.  No CP, SOB, HAs, visual changes, abd pain, N/V.  Denies symptomatic lows unless he skips breakfast.  No numbness/tingling of hands/feet.   Review of Systems For ROS see HPI   This visit occurred during the SARS-CoV-2 public health emergency.  Safety protocols were in place, including screening questions prior to the visit, additional usage of staff PPE, and extensive cleaning of exam room while observing appropriate contact time as indicated for disinfecting solutions.      Objective:   Physical Exam Vitals reviewed.  Constitutional:      General: He is not in acute distress.    Appearance: Normal appearance. He is well-developed. He is not ill-appearing.  HENT:     Head: Normocephalic and atraumatic.  Eyes:     Extraocular Movements: Extraocular movements intact.     Conjunctiva/sclera: Conjunctivae normal.     Pupils: Pupils are equal, round, and reactive to light.  Neck:     Thyroid: No thyromegaly.  Cardiovascular:     Rate and Rhythm: Normal rate and regular rhythm.     Pulses: Normal pulses.     Heart sounds: Normal heart sounds. No murmur heard. Pulmonary:     Effort: Pulmonary effort is normal. No respiratory distress.     Breath sounds: Normal breath sounds.  Abdominal:     General: Bowel sounds are normal. There is no distension.     Palpations: Abdomen is soft.  Musculoskeletal:     Cervical back: Normal range of motion and neck supple.     Right lower leg: No edema.     Left lower leg: No edema.  Lymphadenopathy:     Cervical: No cervical adenopathy.  Skin:    General: Skin is warm and dry.  Neurological:     General: No focal deficit present.     Mental Status: He is alert and oriented to person, place,  and time.     Cranial Nerves: No cranial nerve deficit.  Psychiatric:        Mood and Affect: Mood normal.        Behavior: Behavior normal.          Assessment & Plan:

## 2020-10-31 ENCOUNTER — Other Ambulatory Visit: Payer: Self-pay | Admitting: Family Medicine

## 2021-01-25 DIAGNOSIS — H10231 Serous conjunctivitis, except viral, right eye: Secondary | ICD-10-CM | POA: Diagnosis not present

## 2021-01-29 ENCOUNTER — Other Ambulatory Visit: Payer: Self-pay | Admitting: Family Medicine

## 2021-02-12 ENCOUNTER — Encounter: Payer: Self-pay | Admitting: Family Medicine

## 2021-02-12 ENCOUNTER — Ambulatory Visit (INDEPENDENT_AMBULATORY_CARE_PROVIDER_SITE_OTHER): Payer: BC Managed Care – PPO | Admitting: Family Medicine

## 2021-02-12 VITALS — BP 132/90 | HR 92 | Temp 98.0°F | Resp 16 | Wt 185.6 lb

## 2021-02-12 DIAGNOSIS — E119 Type 2 diabetes mellitus without complications: Secondary | ICD-10-CM

## 2021-02-12 DIAGNOSIS — M6281 Muscle weakness (generalized): Secondary | ICD-10-CM | POA: Diagnosis not present

## 2021-02-12 DIAGNOSIS — I10 Essential (primary) hypertension: Secondary | ICD-10-CM | POA: Diagnosis not present

## 2021-02-12 DIAGNOSIS — E781 Pure hyperglyceridemia: Secondary | ICD-10-CM

## 2021-02-12 LAB — BASIC METABOLIC PANEL
BUN: 12 mg/dL (ref 6–23)
CO2: 23 mEq/L (ref 19–32)
Calcium: 9.3 mg/dL (ref 8.4–10.5)
Chloride: 99 mEq/L (ref 96–112)
Creatinine, Ser: 0.83 mg/dL (ref 0.40–1.50)
GFR: 97.23 mL/min (ref >60.00)
Glucose, Bld: 164 mg/dL — ABNORMAL HIGH (ref 70–99)
Potassium: 4.2 mEq/L (ref 3.5–5.1)
Sodium: 134 mEq/L — ABNORMAL LOW (ref 135–145)

## 2021-02-12 LAB — CBC WITH DIFFERENTIAL/PLATELET
Basophils Absolute: 0.1 10*3/uL (ref 0.0–0.1)
Basophils Relative: 1.3 % (ref 0.0–3.0)
Eosinophils Absolute: 0.2 10*3/uL (ref 0.0–0.7)
Eosinophils Relative: 2.7 % (ref 0.0–5.0)
HCT: 48.5 % (ref 39.0–52.0)
Hemoglobin: 16.3 g/dL (ref 13.0–17.0)
Lymphocytes Relative: 24.4 % (ref 12.0–46.0)
Lymphs Abs: 1.9 10*3/uL (ref 0.7–4.0)
MCHC: 33.7 g/dL (ref 30.0–36.0)
MCV: 98.7 fl (ref 78.0–100.0)
Monocytes Absolute: 0.4 10*3/uL (ref 0.1–1.0)
Monocytes Relative: 5.6 % (ref 3.0–12.0)
Neutro Abs: 5.1 10*3/uL (ref 1.4–7.7)
Neutrophils Relative %: 66 % (ref 43.0–77.0)
Platelets: 184 10*3/uL (ref 150.0–400.0)
RBC: 4.91 Mil/uL (ref 4.22–5.81)
RDW: 12.3 % (ref 11.5–15.5)
WBC: 7.7 10*3/uL (ref 4.0–10.5)

## 2021-02-12 LAB — HEPATIC FUNCTION PANEL
ALT: 32 U/L (ref 0–53)
AST: 21 U/L (ref 0–37)
Albumin: 4.4 g/dL (ref 3.5–5.2)
Alkaline Phosphatase: 73 U/L (ref 39–117)
Bilirubin, Direct: 0.1 mg/dL (ref 0.0–0.3)
Total Bilirubin: 0.7 mg/dL (ref 0.2–1.2)
Total Protein: 7 g/dL (ref 6.0–8.3)

## 2021-02-12 LAB — LIPID PANEL
Cholesterol: 170 mg/dL (ref 0–200)
HDL: 29.8 mg/dL — ABNORMAL LOW (ref >39.00)
Total CHOL/HDL Ratio: 6
Triglycerides: 545 mg/dL — ABNORMAL HIGH (ref 0.0–149.0)

## 2021-02-12 LAB — LDL CHOLESTEROL, DIRECT: Direct LDL: 65 mg/dL

## 2021-02-12 LAB — TESTOSTERONE: Testosterone: 326.01 ng/dL (ref 300.00–890.00)

## 2021-02-12 LAB — HEMOGLOBIN A1C: Hgb A1c MFr Bld: 7.7 % — ABNORMAL HIGH (ref 4.6–6.5)

## 2021-02-12 LAB — TSH: TSH: 0.87 u[IU]/mL (ref 0.35–5.50)

## 2021-02-12 NOTE — Progress Notes (Signed)
   Subjective:    Patient ID: Matthew Green, male    DOB: 1965-09-04, 55 y.o.   MRN: [id number]  HPI DM- chronic problem, on Jardiance 10mg  daily.  Last A1C 7.3%.  UTD on eye exam, foot exam.  On ACE for renal protection.  Denies symptomatic lows.  No numbness/tingling of hands/feet  HTN- chronic problem, on Coreg 12.5mg  BID, Lisinopril 20mg  daily, Spironolactone 25mg  daily.  No CP, SOB, HAs, visual changes, edema.  Hyperlipidemia- chronic problem, on Crestor 5mg  and Fenofibrate 160mg  daily.  Denies abd pain, N/V.  Low energy, losing muscle mass- pt is concerned about low T   Review of Systems For ROS see HPI   This visit occurred during the SARS-CoV-2 public health emergency.  Safety protocols were in place, including screening questions prior to the visit, additional usage of staff PPE, and extensive cleaning of exam room while observing appropriate contact time as indicated for disinfecting solutions.      Objective:   Physical Exam Vitals reviewed.  Constitutional:      General: He is not in acute distress.    Appearance: Normal appearance. He is well-developed. He is not ill-appearing.  HENT:     Head: Normocephalic and atraumatic.  Eyes:     Extraocular Movements: Extraocular movements intact.     Conjunctiva/sclera: Conjunctivae normal.     Pupils: Pupils are equal, round, and reactive to light.  Neck:     Thyroid: No thyromegaly.  Cardiovascular:     Rate and Rhythm: Normal rate and regular rhythm.     Pulses: Normal pulses.     Heart sounds: Normal heart sounds. No murmur heard. Pulmonary:     Effort: Pulmonary effort is normal. No respiratory distress.     Breath sounds: Normal breath sounds.  Abdominal:     General: Bowel sounds are normal. There is no distension.     Palpations: Abdomen is soft.  Musculoskeletal:     Cervical back: Normal range of motion and neck supple.     Right lower leg: No edema.     Left lower leg: No edema.  Lymphadenopathy:      Cervical: No cervical adenopathy.  Skin:    General: Skin is warm and dry.  Neurological:     General: No focal deficit present.     Mental Status: He is alert and oriented to person, place, and time.     Cranial Nerves: No cranial nerve deficit.  Psychiatric:        Mood and Affect: Mood normal.        Behavior: Behavior normal.          Assessment & Plan:   Decreased muscle mass/strength- new.  Pt has been doing some reading and feels that his change in muscle mass and decreased energy is due to low T.  Check labs and tx if needed.

## 2021-02-12 NOTE — Patient Instructions (Signed)
Schedule your complete physical after 06/05/21 We'll notify you of your lab results and make any changes if needed Continue to work on healthy diet and regular exercise- you can do it! Call with any questions or concerns Stay Safe!  Stay Healthy! Happy Holidays!

## 2021-02-12 NOTE — Assessment & Plan Note (Signed)
Chronic problem.  Adequate control on Coreg, Lisinopril, and Spironolactone.  Currently asymptomatic.  Check labs.  No anticipated med changes.

## 2021-02-12 NOTE — Assessment & Plan Note (Signed)
Chronic problem.  On Crestor 5mg  daily and Fenofibrate 160mg  daily w/o difficulty.  Check labs.  Adjust meds prn

## 2021-02-12 NOTE — Assessment & Plan Note (Signed)
Chronic problem, on Jardiance 10mg  daily.  UTD on eye exam, foot exam.  On ACE for renal protection.  Currently asymptomatic.  Check labs.  Adjust meds prn

## 2021-02-25 ENCOUNTER — Telehealth: Payer: Self-pay

## 2021-02-25 NOTE — Telephone Encounter (Signed)
Patient is aware of labs and instructions

## 2021-02-25 NOTE — Telephone Encounter (Signed)
-----   Message from Midge Minium, MD sent at 02/22/2021  7:48 AM EST ----- Your Triglycerides (fatty part of blood) are MUCH too high.  This will improve w/ healthy diet and regular exercise, but in the meantime, please make sure you are taking your Fenofibrate and your Crestor daily as directed.  A1C has also jumped from 7.3 --> 7.7  Please make sure you are working on a low carb diet and taking your Jardiance daily  Testosterone level is normal- great news.  Remainder of labs are stable

## 2021-04-05 DIAGNOSIS — L570 Actinic keratosis: Secondary | ICD-10-CM | POA: Diagnosis not present

## 2021-04-05 DIAGNOSIS — L820 Inflamed seborrheic keratosis: Secondary | ICD-10-CM | POA: Diagnosis not present

## 2021-04-05 DIAGNOSIS — Z85828 Personal history of other malignant neoplasm of skin: Secondary | ICD-10-CM | POA: Diagnosis not present

## 2021-06-04 ENCOUNTER — Other Ambulatory Visit: Payer: Self-pay | Admitting: Family Medicine

## 2021-06-18 ENCOUNTER — Encounter: Payer: Self-pay | Admitting: Family Medicine

## 2021-06-18 ENCOUNTER — Ambulatory Visit (INDEPENDENT_AMBULATORY_CARE_PROVIDER_SITE_OTHER): Payer: BC Managed Care – PPO | Admitting: Family Medicine

## 2021-06-18 VITALS — BP 142/80 | HR 87 | Temp 97.4°F | Resp 16 | Ht 68.5 in | Wt 185.2 lb

## 2021-06-18 DIAGNOSIS — Z0000 Encounter for general adult medical examination without abnormal findings: Secondary | ICD-10-CM

## 2021-06-18 DIAGNOSIS — Z125 Encounter for screening for malignant neoplasm of prostate: Secondary | ICD-10-CM | POA: Diagnosis not present

## 2021-06-18 DIAGNOSIS — E119 Type 2 diabetes mellitus without complications: Secondary | ICD-10-CM | POA: Diagnosis not present

## 2021-06-18 LAB — CBC WITH DIFFERENTIAL/PLATELET
Basophils Absolute: 0 10*3/uL (ref 0.0–0.1)
Basophils Relative: 0.5 % (ref 0.0–3.0)
Eosinophils Absolute: 0.2 10*3/uL (ref 0.0–0.7)
Eosinophils Relative: 3.6 % (ref 0.0–5.0)
HCT: 45.5 % (ref 39.0–52.0)
Hemoglobin: 15.9 g/dL (ref 13.0–17.0)
Lymphocytes Relative: 34.2 % (ref 12.0–46.0)
Lymphs Abs: 2.1 10*3/uL (ref 0.7–4.0)
MCHC: 34.8 g/dL (ref 30.0–36.0)
MCV: 96.7 fl (ref 78.0–100.0)
Monocytes Absolute: 0.4 10*3/uL (ref 0.1–1.0)
Monocytes Relative: 6 % (ref 3.0–12.0)
Neutro Abs: 3.4 10*3/uL (ref 1.4–7.7)
Neutrophils Relative %: 55.7 % (ref 43.0–77.0)
Platelets: 173 10*3/uL (ref 150.0–400.0)
RBC: 4.71 Mil/uL (ref 4.22–5.81)
RDW: 12.4 % (ref 11.5–15.5)
WBC: 6 10*3/uL (ref 4.0–10.5)

## 2021-06-18 LAB — LIPID PANEL
Cholesterol: 142 mg/dL (ref 0–200)
HDL: 27.3 mg/dL — ABNORMAL LOW (ref >39.00)
NonHDL: 114.81
Total CHOL/HDL Ratio: 5
Triglycerides: 330 mg/dL — ABNORMAL HIGH (ref 0.0–149.0)
VLDL: 66 mg/dL — ABNORMAL HIGH (ref 0.0–40.0)

## 2021-06-18 LAB — HEPATIC FUNCTION PANEL
ALT: 23 U/L (ref 0–53)
AST: 19 U/L (ref 0–37)
Albumin: 4.7 g/dL (ref 3.5–5.2)
Alkaline Phosphatase: 53 U/L (ref 39–117)
Bilirubin, Direct: 0.1 mg/dL (ref 0.0–0.3)
Total Bilirubin: 0.6 mg/dL (ref 0.2–1.2)
Total Protein: 7 g/dL (ref 6.0–8.3)

## 2021-06-18 LAB — BASIC METABOLIC PANEL
BUN: 16 mg/dL (ref 6–23)
CO2: 25 mEq/L (ref 19–32)
Calcium: 9.5 mg/dL (ref 8.4–10.5)
Chloride: 100 mEq/L (ref 96–112)
Creatinine, Ser: 0.95 mg/dL (ref 0.40–1.50)
GFR: 88.71 mL/min (ref >60.00)
Glucose, Bld: 161 mg/dL — ABNORMAL HIGH (ref 70–99)
Potassium: 4.4 mEq/L (ref 3.5–5.1)
Sodium: 135 mEq/L (ref 135–145)

## 2021-06-18 LAB — PSA: PSA: 0.4 ng/mL (ref 0.10–4.00)

## 2021-06-18 LAB — LDL CHOLESTEROL, DIRECT: Direct LDL: 77 mg/dL

## 2021-06-18 LAB — TSH: TSH: 1.04 u[IU]/mL (ref 0.35–5.50)

## 2021-06-18 LAB — HEMOGLOBIN A1C: Hgb A1c MFr Bld: 7.8 % — ABNORMAL HIGH (ref 4.6–6.5)

## 2021-06-18 NOTE — Progress Notes (Signed)
? ?  Subjective:  ? ? Patient ID: Matthew Green, male    DOB: [birth year], 56 y.o.   MRN: [id number] ? ?HPI ?CPE-UTD on colonoscopy, Tdap.  BP is mildly elevated- had coffee and was watching news prior to arrival.  Pt reports feeling 'great'.  No concerns today.  UTD on eye exam ? ?Patient Care Team  ?  Relationship Specialty Notifications Start End  ?Midge Minium, MD PCP - General Family Medicine  04/03/14   ?Larey Dresser, MD PCP - Advanced Heart Failure Cardiology Admissions 04/12/19   ?Mosetta Anis, MD Referring Physician Allergy  11/11/16   ?Thelma Comp, [place] Physician Optometry  11/11/16   ?  ? ?Health Maintenance  ?Topic Date Due  ? OPHTHALMOLOGY EXAM  05/08/2021  ? FOOT EXAM  06/05/2021  ? INFLUENZA VACCINE  06/25/2021 (Originally 10/26/2020)  ? HIV Screening  06/19/2022 (Originally 09/21/1978)  ? HEMOGLOBIN A1C  08/12/2021  ? Fecal DNA (Cologuard)  03/04/2022  ? TETANUS/TDAP  06/03/2028  ? Hepatitis C Screening  Completed  ? HPV VACCINES  Aged Out  ? COVID-19 Vaccine  Discontinued  ? Zoster Vaccines- Shingrix  Discontinued  ?  ? ? ?Review of Systems ?Patient reports no vision/hearing changes, anorexia, fever ,adenopathy, persistant/recurrent hoarseness, swallowing issues, chest pain, palpitations, edema, persistant/recurrent cough, hemoptysis, dyspnea (rest,exertional, paroxysmal nocturnal), gastrointestinal  bleeding (melena, rectal bleeding), abdominal pain, excessive heart burn, GU symptoms (dysuria, hematuria, voiding/incontinence issues) syncope, focal weakness, memory loss, numbness & tingling, skin/hair/nail changes, depression, anxiety, abnormal bruising/bleeding, musculoskeletal symptoms/signs.  ? ?This visit occurred during the SARS-CoV-2 public health emergency.  Safety protocols were in place, including screening questions prior to the visit, additional usage of staff PPE, and extensive cleaning of exam room while observing appropriate contact time as indicated for disinfecting  solutions.   ?   ?Objective:  ? Physical Exam ?General Appearance:    Alert, cooperative, no distress, appears stated age  ?Head:    Normocephalic, without obvious abnormality, atraumatic  ?Eyes:    PERRL, conjunctiva/corneas clear, EOM's intact, fundi  ?  benign, both eyes       ?Ears:    Normal TM's and external ear canals, both ears  ?Nose:   Deferred due to COVID  ?Throat:   ?Neck:   Supple, symmetrical, trachea midline, no adenopathy;     ?  thyroid:  No enlargement/tenderness/nodules  ?Back:     Symmetric, no curvature, ROM normal, no CVA tenderness  ?Lungs:     Clear to auscultation bilaterally, respirations unlabored  ?Chest wall:    No tenderness or deformity  ?Heart:    Regular rate and rhythm, S1 and S2 normal, no murmur, rub ?  or gallop  ?Abdomen:     Soft, non-tender, bowel sounds active all four quadrants,  ?  no masses, no organomegaly  ?Genitalia:    Deferred due to COVID  ?Rectal:    ?Extremities:   Extremities normal, atraumatic, no cyanosis or edema  ?Pulses:   2+ and symmetric all extremities  ?Skin:   Skin color, texture, turgor normal, no rashes or lesions  ?Lymph nodes:   Cervical, supraclavicular, and axillary nodes normal  ?Neurologic:   CNII-XII intact. Normal strength, sensation and reflexes    ?  throughout  ?  ? ? ? ?   ?Assessment & Plan:  ? ? ?

## 2021-06-18 NOTE — Assessment & Plan Note (Signed)
Chronic problem.  UTD on eye exam, on ACE.  Foot exam done today.  Check labs.  Adjust meds prn  ?

## 2021-06-18 NOTE — Assessment & Plan Note (Signed)
Pt's PE WNL and unchanged from previous.  UTD on colonoscopy, Tdap.  BP elevated today but he just got confirmation that mom has dementia and is overwhelmed.  Check labs.  Anticipatory guidance provided.  ?

## 2021-06-18 NOTE — Patient Instructions (Addendum)
Follow up in 3-4 months to recheck diabetes and BP ?We'll notify you of your lab results and make any changes if needed ?Continue to work on healthy diet and regular exercise- you can do it!!! ?Call with any questions or concerns ?Stay Safe!  Stay Healthy! ?Happy Spring!!! ?

## 2021-07-27 ENCOUNTER — Encounter: Payer: Self-pay | Admitting: Family Medicine

## 2021-07-30 ENCOUNTER — Other Ambulatory Visit: Payer: Self-pay | Admitting: Family Medicine

## 2021-07-30 ENCOUNTER — Other Ambulatory Visit (HOSPITAL_COMMUNITY): Payer: Self-pay | Admitting: Cardiology

## 2021-08-11 ENCOUNTER — Other Ambulatory Visit (HOSPITAL_COMMUNITY): Payer: Self-pay | Admitting: Cardiology

## 2021-09-13 DIAGNOSIS — D225 Melanocytic nevi of trunk: Secondary | ICD-10-CM | POA: Diagnosis not present

## 2021-09-13 DIAGNOSIS — Z85828 Personal history of other malignant neoplasm of skin: Secondary | ICD-10-CM | POA: Diagnosis not present

## 2021-09-13 DIAGNOSIS — D2261 Melanocytic nevi of right upper limb, including shoulder: Secondary | ICD-10-CM | POA: Diagnosis not present

## 2021-09-13 DIAGNOSIS — D2262 Melanocytic nevi of left upper limb, including shoulder: Secondary | ICD-10-CM | POA: Diagnosis not present

## 2021-09-13 DIAGNOSIS — L570 Actinic keratosis: Secondary | ICD-10-CM | POA: Diagnosis not present

## 2021-10-22 ENCOUNTER — Ambulatory Visit: Payer: BC Managed Care – PPO | Admitting: Family Medicine

## 2021-10-22 ENCOUNTER — Encounter: Payer: Self-pay | Admitting: Family Medicine

## 2021-10-22 VITALS — BP 122/82 | HR 88 | Temp 97.4°F | Resp 17 | Ht 68.5 in | Wt 183.0 lb

## 2021-10-22 DIAGNOSIS — I5022 Chronic systolic (congestive) heart failure: Secondary | ICD-10-CM

## 2021-10-22 DIAGNOSIS — I10 Essential (primary) hypertension: Secondary | ICD-10-CM

## 2021-10-22 DIAGNOSIS — E119 Type 2 diabetes mellitus without complications: Secondary | ICD-10-CM | POA: Diagnosis not present

## 2021-10-22 LAB — BASIC METABOLIC PANEL
BUN: 18 mg/dL (ref 6–23)
CO2: 25 mEq/L (ref 19–32)
Calcium: 9.4 mg/dL (ref 8.4–10.5)
Chloride: 97 mEq/L (ref 96–112)
Creatinine, Ser: 1.03 mg/dL (ref 0.40–1.50)
GFR: 80.31 mL/min (ref >60.00)
Glucose, Bld: 148 mg/dL — ABNORMAL HIGH (ref 70–99)
Potassium: 4.3 mEq/L (ref 3.5–5.1)
Sodium: 135 mEq/L (ref 135–145)

## 2021-10-22 LAB — MICROALBUMIN / CREATININE URINE RATIO
Creatinine,U: 25.1 mg/dL
Microalb Creat Ratio: 2.8 mg/g (ref 0.0–30.0)
Microalb, Ur: <0.7 mg/dL (ref 0.0–1.9)

## 2021-10-22 LAB — HEMOGLOBIN A1C: Hgb A1c MFr Bld: 7.5 % — ABNORMAL HIGH (ref 4.6–6.5)

## 2021-10-22 NOTE — Assessment & Plan Note (Signed)
Chronic problem.  Currently following w/ Cardiology and on all appropriate medications- beta blocker, ACE, Spironolactone, Jardiance, Eliquis.  Asymptomatic.  Will continue to follow along.

## 2021-10-22 NOTE — Progress Notes (Signed)
   Subjective:    Patient ID: Matthew Green, male    DOB: Mar 28, 1966, 56 y.o.   MRN: [id number]  HPI DM- chronic problem, on Jardiance '10mg'$  daily.  Last A1C 7.8%.  UTD on foot exam.  Due for eye exam (has eye exam scheduled for Tuesday), microalbumin.  'i feel excellent!'.  No symptomatic lows.  Denies abd pain, N/V.  HTN- chronic problem, on Coreg 12.'5mg'$  BID, Lisinopril '20mg'$  daily, Spironolactone '25mg'$  daily.  BP is much better today than last visit.  Denies CP, SOB, HAs, visual changes, edema.  CHF- chronic problem, on beta blocker, ACE, Spironolactone, Jardiance, Eliquis.  Following regularly w/ Cards  Review of Systems For ROS see HPI     Objective:   Physical Exam Vitals reviewed.  Constitutional:      General: He is not in acute distress.    Appearance: Normal appearance. He is well-developed. He is not ill-appearing.  HENT:     Head: Normocephalic and atraumatic.  Eyes:     Extraocular Movements: Extraocular movements intact.     Conjunctiva/sclera: Conjunctivae normal.     Pupils: Pupils are equal, round, and reactive to light.  Neck:     Thyroid: No thyromegaly.  Cardiovascular:     Rate and Rhythm: Normal rate and regular rhythm.     Pulses: Normal pulses.     Heart sounds: Normal heart sounds. No murmur heard. Pulmonary:     Effort: Pulmonary effort is normal. No respiratory distress.     Breath sounds: Normal breath sounds.  Abdominal:     General: Bowel sounds are normal. There is no distension.     Palpations: Abdomen is soft.  Musculoskeletal:     Cervical back: Normal range of motion and neck supple.     Right lower leg: No edema.     Left lower leg: No edema.  Lymphadenopathy:     Cervical: No cervical adenopathy.  Skin:    General: Skin is warm and dry.  Neurological:     General: No focal deficit present.     Mental Status: He is alert and oriented to person, place, and time.     Cranial Nerves: No cranial nerve deficit.  Psychiatric:        Mood  and Affect: Mood normal.        Behavior: Behavior normal.           Assessment & Plan:

## 2021-10-22 NOTE — Patient Instructions (Signed)
Follow up in 3-4 months to recheck sugar and cholesterol We'll notify you of your lab results and make any changes if needed Keep up the good work on healthy diet and regular exercise- you look great! Have your eye doctor send me a copy of their report Call with any questions or concerns Enjoy the rest of your summer!!!

## 2021-10-22 NOTE — Assessment & Plan Note (Signed)
Chronic problem.  UTD on foot exam.  Due for eye exam (scheduled) and microalbumin.  Tolerating Jardiance '10mg'$  daily w/o difficulty.  Check labs.  Adjust meds prn

## 2021-10-22 NOTE — Assessment & Plan Note (Signed)
Chronic problem.  Currently well controlled on Coreg 12.'5mg'$  BID, Lisinopril '20mg'$  daily, and Spironolactone '25mg'$  daily.  Asymptomatic.  Check labs due to ACE and diuretic.  No anticipated med changes.

## 2021-10-25 NOTE — Progress Notes (Signed)
Pt seen results via my chart  

## 2021-10-26 DIAGNOSIS — E119 Type 2 diabetes mellitus without complications: Secondary | ICD-10-CM | POA: Diagnosis not present

## 2021-10-26 DIAGNOSIS — H524 Presbyopia: Secondary | ICD-10-CM | POA: Diagnosis not present

## 2021-10-26 DIAGNOSIS — H52223 Regular astigmatism, bilateral: Secondary | ICD-10-CM | POA: Diagnosis not present

## 2021-10-26 LAB — HM DIABETES EYE EXAM

## 2021-10-27 ENCOUNTER — Encounter: Payer: Self-pay | Admitting: Family Medicine

## 2021-11-28 ENCOUNTER — Other Ambulatory Visit: Payer: Self-pay | Admitting: Family Medicine

## 2021-12-01 ENCOUNTER — Other Ambulatory Visit (HOSPITAL_COMMUNITY): Payer: Self-pay | Admitting: Cardiology

## 2021-12-01 DIAGNOSIS — I5022 Chronic systolic (congestive) heart failure: Secondary | ICD-10-CM

## 2022-01-28 ENCOUNTER — Other Ambulatory Visit: Payer: Self-pay | Admitting: Family Medicine

## 2022-03-01 ENCOUNTER — Ambulatory Visit: Payer: BC Managed Care – PPO | Admitting: Family Medicine

## 2022-03-01 ENCOUNTER — Encounter: Payer: Self-pay | Admitting: Family Medicine

## 2022-03-01 VITALS — BP 126/80 | HR 84 | Temp 98.9°F | Resp 17 | Ht 68.5 in | Wt 184.1 lb

## 2022-03-01 DIAGNOSIS — E781 Pure hyperglyceridemia: Secondary | ICD-10-CM | POA: Diagnosis not present

## 2022-03-01 DIAGNOSIS — I10 Essential (primary) hypertension: Secondary | ICD-10-CM

## 2022-03-01 DIAGNOSIS — E119 Type 2 diabetes mellitus without complications: Secondary | ICD-10-CM

## 2022-03-01 LAB — LIPID PANEL
Cholesterol: 151 mg/dL (ref 0–200)
HDL: 28.8 mg/dL — ABNORMAL LOW (ref >39.00)
NonHDL: 121.93
Total CHOL/HDL Ratio: 5
Triglycerides: 380 mg/dL — ABNORMAL HIGH (ref 0.0–149.0)
VLDL: 76 mg/dL — ABNORMAL HIGH (ref 0.0–40.0)

## 2022-03-01 LAB — CBC WITH DIFFERENTIAL/PLATELET
Basophils Absolute: 0 10*3/uL (ref 0.0–0.1)
Basophils Relative: 0.8 % (ref 0.0–3.0)
Eosinophils Absolute: 0.3 10*3/uL (ref 0.0–0.7)
Eosinophils Relative: 4.3 % (ref 0.0–5.0)
HCT: 47.5 % (ref 39.0–52.0)
Hemoglobin: 16.6 g/dL (ref 13.0–17.0)
Lymphocytes Relative: 26.8 % (ref 12.0–46.0)
Lymphs Abs: 1.7 10*3/uL (ref 0.7–4.0)
MCHC: 34.9 g/dL (ref 30.0–36.0)
MCV: 96.8 fl (ref 78.0–100.0)
Monocytes Absolute: 0.4 10*3/uL (ref 0.1–1.0)
Monocytes Relative: 6.2 % (ref 3.0–12.0)
Neutro Abs: 3.9 10*3/uL (ref 1.4–7.7)
Neutrophils Relative %: 61.9 % (ref 43.0–77.0)
Platelets: 203 10*3/uL (ref 150.0–400.0)
RBC: 4.91 Mil/uL (ref 4.22–5.81)
RDW: 12.5 % (ref 11.5–15.5)
WBC: 6.3 10*3/uL (ref 4.0–10.5)

## 2022-03-01 LAB — BASIC METABOLIC PANEL
BUN: 21 mg/dL (ref 6–23)
CO2: 24 mEq/L (ref 19–32)
Calcium: 9.3 mg/dL (ref 8.4–10.5)
Chloride: 98 mEq/L (ref 96–112)
Creatinine, Ser: 1 mg/dL (ref 0.40–1.50)
GFR: 83 mL/min (ref >60.00)
Glucose, Bld: 149 mg/dL — ABNORMAL HIGH (ref 70–99)
Potassium: 4.4 mEq/L (ref 3.5–5.1)
Sodium: 133 mEq/L — ABNORMAL LOW (ref 135–145)

## 2022-03-01 LAB — HEPATIC FUNCTION PANEL
ALT: 24 U/L (ref 0–53)
AST: 23 U/L (ref 0–37)
Albumin: 4.6 g/dL (ref 3.5–5.2)
Alkaline Phosphatase: 43 U/L (ref 39–117)
Bilirubin, Direct: 0.1 mg/dL (ref 0.0–0.3)
Total Bilirubin: 0.5 mg/dL (ref 0.2–1.2)
Total Protein: 7.1 g/dL (ref 6.0–8.3)

## 2022-03-01 LAB — TSH: TSH: 1.25 u[IU]/mL (ref 0.35–5.50)

## 2022-03-01 LAB — LDL CHOLESTEROL, DIRECT: Direct LDL: 75 mg/dL

## 2022-03-01 LAB — HEMOGLOBIN A1C: Hgb A1c MFr Bld: 7.9 % — ABNORMAL HIGH (ref 4.6–6.5)

## 2022-03-01 NOTE — Assessment & Plan Note (Signed)
Chronic problem.  On Jardiance '10mg'$  daily w/o difficulty.  UTD on foot exam, eye exam, and microalbumin.  Check labs.  Adjust meds prn

## 2022-03-01 NOTE — Patient Instructions (Signed)
Schedule your complete physical in April We'll notify you of your lab results and make any changes if needed Continue to work on healthy diet and regular exercise- you're doing great!!! Call with any questions or concerns Stay Safe!  Stay Healthy!! Happy Holidays!!

## 2022-03-01 NOTE — Assessment & Plan Note (Signed)
Chronic problem.  Tolerating Crestor '5mg'$  daily or Fenofibrate '160mg'$  daily w/o difficulty.  Check labs.  Adjust meds prn

## 2022-03-01 NOTE — Progress Notes (Signed)
   Subjective:    Patient ID: [name], male    DOB: [birth year], 56 y.o.   MRN: [id number]  HPI DM- chronic problem, on Jardiance '10mg'$  daily.  UTD on foot exam, eye exam, and microalbumin.  Last A1C 7.5%  'i feel wonderful'.  Denies symptomatic lows.  No numbness/tingling of hands or feet.  Hyperlipidemia- chronic problem, on Crestor '5mg'$  daily and Fenofibrate '160mg'$  daily.  No abd pain, N/V.  HTN- chronic problem, on Coreg 12.'5mg'$  BID, Lisinopril '20mg'$  daily, and Spironolactone '25mg'$  daily w/ adequate control.  No CP, SOB, HA's, visual changes, edema.  Review of Systems For ROS see HPI     Objective:   Physical Exam Vitals reviewed.  Constitutional:      General: He is not in acute distress.    Appearance: Normal appearance. He is well-developed. He is not ill-appearing.  HENT:     Head: Normocephalic and atraumatic.  Eyes:     Extraocular Movements: Extraocular movements intact.     Conjunctiva/sclera: Conjunctivae normal.     Pupils: Pupils are equal, round, and reactive to light.  Neck:     Thyroid: No thyromegaly.  Cardiovascular:     Rate and Rhythm: Normal rate and regular rhythm.     Pulses: Normal pulses.     Heart sounds: Normal heart sounds. No murmur heard. Pulmonary:     Effort: Pulmonary effort is normal. No respiratory distress.     Breath sounds: Normal breath sounds.  Abdominal:     General: Bowel sounds are normal. There is no distension.     Palpations: Abdomen is soft.  Musculoskeletal:     Cervical back: Normal range of motion and neck supple.     Right lower leg: No edema.     Left lower leg: No edema.  Lymphadenopathy:     Cervical: No cervical adenopathy.  Skin:    General: Skin is warm and dry.  Neurological:     General: No focal deficit present.     Mental Status: He is alert and oriented to person, place, and time.     Cranial Nerves: No cranial nerve deficit.  Psychiatric:        Mood and Affect: Mood normal.        Behavior: Behavior  normal.           Assessment & Plan:

## 2022-03-01 NOTE — Assessment & Plan Note (Signed)
Chronic problem.  On Coreg 12.'5mg'$  BID, Lisinopril '20mg'$  daily, and Spironolactone '25mg'$  daily w/ adequate BP control.  Currently asymptomatic.  Check labs due to ACE and diuretic but no anticipated med changes.  Will follow.

## 2022-03-04 ENCOUNTER — Other Ambulatory Visit (HOSPITAL_COMMUNITY): Payer: Self-pay | Admitting: Cardiology

## 2022-03-04 DIAGNOSIS — I5022 Chronic systolic (congestive) heart failure: Secondary | ICD-10-CM

## 2022-03-11 ENCOUNTER — Telehealth (HOSPITAL_COMMUNITY): Payer: Self-pay | Admitting: Cardiology

## 2022-03-11 ENCOUNTER — Other Ambulatory Visit (HOSPITAL_COMMUNITY): Payer: Self-pay | Admitting: *Deleted

## 2022-03-11 DIAGNOSIS — I5022 Chronic systolic (congestive) heart failure: Secondary | ICD-10-CM

## 2022-03-11 MED ORDER — APIXABAN 2.5 MG PO TABS: ORAL_TABLET | ORAL | 11 refills | Status: DC

## 2022-03-11 NOTE — Telephone Encounter (Signed)
Pt  stated he need eliquis refill, but is not due for a 1 yr f/u until April, please advise

## 2022-05-26 ENCOUNTER — Other Ambulatory Visit: Payer: Self-pay | Admitting: Family Medicine

## 2022-06-17 ENCOUNTER — Other Ambulatory Visit: Payer: Self-pay | Admitting: Family Medicine

## 2022-06-17 ENCOUNTER — Other Ambulatory Visit (HOSPITAL_COMMUNITY): Payer: Self-pay | Admitting: Family Medicine

## 2022-06-17 NOTE — Telephone Encounter (Signed)
Pt aware rx has been sent in  

## 2022-06-17 NOTE — Telephone Encounter (Signed)
'  it looks like Dr Aundra Dubin sent this in the last time . Can we refill for pt ?

## 2022-07-08 ENCOUNTER — Encounter: Payer: Self-pay | Admitting: Family Medicine

## 2022-07-08 ENCOUNTER — Telehealth: Payer: Self-pay

## 2022-07-08 ENCOUNTER — Ambulatory Visit (INDEPENDENT_AMBULATORY_CARE_PROVIDER_SITE_OTHER): Payer: BC Managed Care – PPO | Admitting: Family Medicine

## 2022-07-08 VITALS — BP 138/80 | HR 80 | Temp 97.9°F | Resp 16 | Ht 68.5 in | Wt 181.0 lb

## 2022-07-08 DIAGNOSIS — Z0000 Encounter for general adult medical examination without abnormal findings: Secondary | ICD-10-CM | POA: Diagnosis not present

## 2022-07-08 DIAGNOSIS — E119 Type 2 diabetes mellitus without complications: Secondary | ICD-10-CM | POA: Diagnosis not present

## 2022-07-08 DIAGNOSIS — Z125 Encounter for screening for malignant neoplasm of prostate: Secondary | ICD-10-CM | POA: Diagnosis not present

## 2022-07-08 LAB — CBC WITH DIFFERENTIAL/PLATELET
Basophils Absolute: 0 10*3/uL (ref 0.0–0.1)
Basophils Relative: 0.5 % (ref 0.0–3.0)
Eosinophils Absolute: 0.2 10*3/uL (ref 0.0–0.7)
Eosinophils Relative: 3.1 % (ref 0.0–5.0)
HCT: 48.5 % (ref 39.0–52.0)
Hemoglobin: 16.9 g/dL (ref 13.0–17.0)
Lymphocytes Relative: 24.5 % (ref 12.0–46.0)
Lymphs Abs: 1.8 10*3/uL (ref 0.7–4.0)
MCHC: 34.8 g/dL (ref 30.0–36.0)
MCV: 96.9 fl (ref 78.0–100.0)
Monocytes Absolute: 0.4 10*3/uL (ref 0.1–1.0)
Monocytes Relative: 4.9 % (ref 3.0–12.0)
Neutro Abs: 5 10*3/uL (ref 1.4–7.7)
Neutrophils Relative %: 67 % (ref 43.0–77.0)
Platelets: 188 10*3/uL (ref 150.0–400.0)
RBC: 5.01 Mil/uL (ref 4.22–5.81)
RDW: 12.4 % (ref 11.5–15.5)
WBC: 7.4 10*3/uL (ref 4.0–10.5)

## 2022-07-08 LAB — BASIC METABOLIC PANEL
BUN: 14 mg/dL (ref 6–23)
CO2: 27 mEq/L (ref 19–32)
Calcium: 9.8 mg/dL (ref 8.4–10.5)
Chloride: 100 mEq/L (ref 96–112)
Creatinine, Ser: 0.91 mg/dL (ref 0.40–1.50)
GFR: 92.72 mL/min (ref >60.00)
Glucose, Bld: 156 mg/dL — ABNORMAL HIGH (ref 70–99)
Potassium: 4.5 mEq/L (ref 3.5–5.1)
Sodium: 137 mEq/L (ref 135–145)

## 2022-07-08 LAB — HEPATIC FUNCTION PANEL
ALT: 21 U/L (ref 0–53)
AST: 16 U/L (ref 0–37)
Albumin: 4.7 g/dL (ref 3.5–5.2)
Alkaline Phosphatase: 61 U/L (ref 39–117)
Bilirubin, Direct: 0.1 mg/dL (ref 0.0–0.3)
Total Bilirubin: 0.7 mg/dL (ref 0.2–1.2)
Total Protein: 7.1 g/dL (ref 6.0–8.3)

## 2022-07-08 LAB — TSH: TSH: 1 u[IU]/mL (ref 0.35–5.50)

## 2022-07-08 LAB — LIPID PANEL
Cholesterol: 133 mg/dL (ref 0–200)
HDL: 28.9 mg/dL — ABNORMAL LOW (ref >39.00)
NonHDL: 104.58
Total CHOL/HDL Ratio: 5
Triglycerides: 216 mg/dL — ABNORMAL HIGH (ref 0.0–149.0)
VLDL: 43.2 mg/dL — ABNORMAL HIGH (ref 0.0–40.0)

## 2022-07-08 LAB — PSA: PSA: 0.78 ng/mL (ref 0.10–4.00)

## 2022-07-08 LAB — LDL CHOLESTEROL, DIRECT: Direct LDL: 78 mg/dL

## 2022-07-08 LAB — HEMOGLOBIN A1C: Hgb A1c MFr Bld: 7.9 % — ABNORMAL HIGH (ref 4.6–6.5)

## 2022-07-08 NOTE — Patient Instructions (Addendum)
Follow up in 3-4 months to recheck sugar We'll notify you of your lab results and make any changes if needed Keep up the good work on healthy diet and regular exercise- you can do it!!! Call GI and schedule your repeat colonoscopy [phone] Call with any questions or concerns Stay Safe!  Stay Healthy! CONGRATS ON THE PUPPY!!!

## 2022-07-08 NOTE — Assessment & Plan Note (Signed)
Chronic problem.  Last A1C 7.2%  UTD on eye exam, microalbumin.  Foot exam done today.  Check labs.  Adjust meds prn

## 2022-07-08 NOTE — Assessment & Plan Note (Signed)
Pt's PE WNL.  Due for colonoscopy- pt to schedule.  Foot exam done today.  Check labs.  Anticipatory guidance provided.

## 2022-07-08 NOTE — Progress Notes (Signed)
   Subjective:    Patient ID: Matthew Green, male    DOB: 06-Mar-1966, 57 y.o.   MRN: [id number]  HPI CPE- UTD on microalbumin, eye exam, Tdap.  Due for colonoscopy and foot exam.  Pt reports feeling well.  No concerns  Patient Care Team    Relationship Specialty Notifications Start End  Sheliah Hatch, MD PCP - General Family Medicine  04/03/14   Laurey Morale, MD PCP - Advanced Heart Failure Cardiology Admissions 04/12/19   Sidney Ace, MD Referring Physician Allergy  11/11/16   Blair Promise, OD Consulting Physician Optometry  11/11/16      Health Maintenance  Topic Date Due   COLONOSCOPY (Pts 45-72yrs Insurance coverage will need to be confirmed)  05/07/2022   FOOT EXAM  06/19/2022   HEMOGLOBIN A1C  08/31/2022   Diabetic kidney evaluation - Urine ACR  10/23/2022   INFLUENZA VACCINE  10/27/2022   OPHTHALMOLOGY EXAM  10/27/2022   Diabetic kidney evaluation - eGFR measurement  03/02/2023   DTaP/Tdap/Td (3 - Td or Tdap) 06/03/2028   Hepatitis C Screening  Completed   HPV VACCINES  Aged Out   COVID-19 Vaccine  Discontinued   Fecal DNA (Cologuard)  Discontinued   HIV Screening  Discontinued   Zoster Vaccines- Shingrix  Discontinued      Review of Systems Patient reports no vision/hearing changes, anorexia, fever ,adenopathy, persistant/recurrent hoarseness, swallowing issues, chest pain, palpitations, edema, persistant/recurrent cough, hemoptysis, dyspnea (rest,exertional, paroxysmal nocturnal), gastrointestinal  bleeding (melena, rectal bleeding), abdominal pain, excessive heart burn, GU symptoms (dysuria, hematuria, voiding/incontinence issues) syncope, focal weakness, memory loss, numbness & tingling, skin/hair/nail changes, depression, anxiety, abnormal bruising/bleeding, musculoskeletal symptoms/signs.     Objective:   Physical Exam General Appearance:    Alert, cooperative, no distress, appears stated age  Head:    Normocephalic, without obvious abnormality,  atraumatic  Eyes:    PERRL, conjunctiva/corneas clear, EOM's intact both eyes       Ears:    Normal TM's and external ear canals, both ears  Nose:   Nares normal, septum midline, mucosa normal, no drainage   or sinus tenderness  Throat:   Lips, mucosa, and tongue normal; teeth and gums normal  Neck:   Supple, symmetrical, trachea midline, no adenopathy;       thyroid:  No enlargement/tenderness/nodules  Back:     Symmetric, no curvature, ROM normal, no CVA tenderness  Lungs:     Clear to auscultation bilaterally, respirations unlabored  Chest wall:    No tenderness or deformity  Heart:    Regular rate and rhythm, S1 and S2 normal, no murmur, rub   or gallop  Abdomen:     Soft, non-tender, bowel sounds active all four quadrants,    no masses, no organomegaly  Genitalia:    deferred  Rectal:    Extremities:   Extremities normal, atraumatic, no cyanosis or edema  Pulses:   2+ and symmetric all extremities  Skin:   Skin color, texture, turgor normal, no rashes or lesions  Lymph nodes:   Cervical, supraclavicular, and axillary nodes normal  Neurologic:   CNII-XII intact. Normal strength, sensation and reflexes      throughout          Assessment & Plan:

## 2022-07-08 NOTE — Telephone Encounter (Signed)
Left pt a VM with lab results  

## 2022-07-08 NOTE — Telephone Encounter (Signed)
-----   Message from Sheliah Hatch, MD sent at 07/08/2022  3:25 PM EDT ----- Labs are stable.  A1C is still above goal- I would love to see this under 7.5%- so please be mindful of your sugar and carb intake.  No med changes at this time

## 2022-07-27 ENCOUNTER — Encounter: Payer: Self-pay | Admitting: Gastroenterology

## 2022-08-10 ENCOUNTER — Other Ambulatory Visit (HOSPITAL_COMMUNITY): Payer: Self-pay | Admitting: Cardiology

## 2022-10-03 ENCOUNTER — Other Ambulatory Visit (HOSPITAL_COMMUNITY): Payer: Self-pay | Admitting: Cardiology

## 2022-10-05 DIAGNOSIS — L821 Other seborrheic keratosis: Secondary | ICD-10-CM | POA: Diagnosis not present

## 2022-10-05 DIAGNOSIS — L570 Actinic keratosis: Secondary | ICD-10-CM | POA: Diagnosis not present

## 2022-10-05 DIAGNOSIS — D2372 Other benign neoplasm of skin of left lower limb, including hip: Secondary | ICD-10-CM | POA: Diagnosis not present

## 2022-10-05 DIAGNOSIS — D225 Melanocytic nevi of trunk: Secondary | ICD-10-CM | POA: Diagnosis not present

## 2022-10-05 DIAGNOSIS — Z85828 Personal history of other malignant neoplasm of skin: Secondary | ICD-10-CM | POA: Diagnosis not present

## 2022-10-26 ENCOUNTER — Encounter (INDEPENDENT_AMBULATORY_CARE_PROVIDER_SITE_OTHER): Payer: Self-pay

## 2022-11-02 ENCOUNTER — Other Ambulatory Visit: Payer: Self-pay | Admitting: Family Medicine

## 2022-11-03 DIAGNOSIS — E119 Type 2 diabetes mellitus without complications: Secondary | ICD-10-CM | POA: Diagnosis not present

## 2022-11-03 DIAGNOSIS — H524 Presbyopia: Secondary | ICD-10-CM | POA: Diagnosis not present

## 2022-11-03 DIAGNOSIS — H52223 Regular astigmatism, bilateral: Secondary | ICD-10-CM | POA: Diagnosis not present

## 2022-11-03 LAB — HM DIABETES EYE EXAM

## 2022-11-13 ENCOUNTER — Other Ambulatory Visit (HOSPITAL_COMMUNITY): Payer: Self-pay | Admitting: Cardiology

## 2022-11-18 ENCOUNTER — Ambulatory Visit: Payer: BC Managed Care – PPO | Admitting: Family Medicine

## 2022-11-18 ENCOUNTER — Telehealth: Payer: Self-pay

## 2022-11-18 ENCOUNTER — Encounter: Payer: Self-pay | Admitting: Family Medicine

## 2022-11-18 VITALS — BP 128/72 | HR 89 | Temp 98.1°F | Ht 68.5 in | Wt 177.0 lb

## 2022-11-18 DIAGNOSIS — E119 Type 2 diabetes mellitus without complications: Secondary | ICD-10-CM

## 2022-11-18 DIAGNOSIS — Z7984 Long term (current) use of oral hypoglycemic drugs: Secondary | ICD-10-CM | POA: Diagnosis not present

## 2022-11-18 LAB — BASIC METABOLIC PANEL
BUN: 14 mg/dL (ref 6–23)
CO2: 25 mEq/L (ref 19–32)
Calcium: 9.4 mg/dL (ref 8.4–10.5)
Chloride: 100 mEq/L (ref 96–112)
Creatinine, Ser: 0.88 mg/dL (ref 0.40–1.50)
GFR: 94.35 mL/min (ref >60.00)
Glucose, Bld: 156 mg/dL — ABNORMAL HIGH (ref 70–99)
Potassium: 4.5 mEq/L (ref 3.5–5.1)
Sodium: 135 mEq/L (ref 135–145)

## 2022-11-18 LAB — MICROALBUMIN / CREATININE URINE RATIO
Creatinine,U: 28.9 mg/dL
Microalb Creat Ratio: 2.4 mg/g (ref 0.0–30.0)
Microalb, Ur: <0.7 mg/dL (ref 0.0–1.9)

## 2022-11-18 LAB — HEMOGLOBIN A1C: Hgb A1c MFr Bld: 6.9 % — ABNORMAL HIGH (ref 4.6–6.5)

## 2022-11-18 NOTE — Assessment & Plan Note (Signed)
Chronic problem.  Pt has been doing quite well.  Tolerating Jardiance w/o difficulty.  UTD on foot exam, eye exam.  Microalbumin ordered.  He is more physically active since getting his puppy and is down 4 lbs.  Check labs.  Adjust meds prn

## 2022-11-18 NOTE — Telephone Encounter (Signed)
-----   Message from Neena Rhymes sent at 11/18/2022  2:14 PM EDT ----- Labs look great!  No changes at this time

## 2022-11-18 NOTE — Progress Notes (Signed)
   Subjective:    Patient ID: Matthew Green, male    DOB: [birth year], 57 y.o.   MRN: [id number]  HPI DM- chronic problem.  On Jardiance 10mg  daily.  On ACE for renal protection (due for microalbumin).  UTD on eye exam, foot exam.  Pt is down 4 lbs since last visit.  Pt is much more active since getting a puppy.  Pt reports feeling 'excellent'.  No CP, SOB, HA's, visual changes, abd pain, N/V.  No numbness/tingling of hands or feet.  Denies symptomatic lows.   Review of Systems For ROS see HPI     Objective:   Physical Exam Vitals reviewed.  Constitutional:      General: He is not in acute distress.    Appearance: Normal appearance. He is well-developed.  HENT:     Head: Normocephalic and atraumatic.  Eyes:     Extraocular Movements: Extraocular movements intact.     Conjunctiva/sclera: Conjunctivae normal.     Pupils: Pupils are equal, round, and reactive to light.  Neck:     Thyroid: No thyromegaly.  Cardiovascular:     Rate and Rhythm: Normal rate and regular rhythm.     Pulses: Normal pulses.     Heart sounds: Normal heart sounds. No murmur heard. Pulmonary:     Effort: Pulmonary effort is normal. No respiratory distress.     Breath sounds: Normal breath sounds.  Abdominal:     General: Bowel sounds are normal. There is no distension.     Palpations: Abdomen is soft.  Musculoskeletal:     Cervical back: Normal range of motion and neck supple.     Right lower leg: No edema.     Left lower leg: No edema.  Lymphadenopathy:     Cervical: No cervical adenopathy.  Skin:    General: Skin is warm and dry.  Neurological:     General: No focal deficit present.     Mental Status: He is alert and oriented to person, place, and time.     Cranial Nerves: No cranial nerve deficit.  Psychiatric:        Mood and Affect: Mood normal.        Behavior: Behavior normal.           Assessment & Plan:

## 2022-11-18 NOTE — Patient Instructions (Signed)
Follow up in 3-4 months to recheck sugar, blood pressure, and cholesterol We'll notify you of your lab results and make any changes if needed Continue to work on healthy diet and regular exercise- you're doing great! Call [phone] to schedule a GI appt to discuss whether repeat colonoscopy is needed Call with any questions or concerns Stay Safe!  Stay Healthy! Happy Labor Day!!

## 2022-11-18 NOTE — Telephone Encounter (Signed)
Pt seen results Via my chart  

## 2022-12-01 ENCOUNTER — Other Ambulatory Visit: Payer: Self-pay | Admitting: Family Medicine

## 2022-12-13 ENCOUNTER — Other Ambulatory Visit (HOSPITAL_COMMUNITY): Payer: Self-pay | Admitting: Cardiology

## 2023-03-03 ENCOUNTER — Ambulatory Visit: Payer: BC Managed Care – PPO | Admitting: Family Medicine

## 2023-03-03 ENCOUNTER — Encounter: Payer: Self-pay | Admitting: Family Medicine

## 2023-03-03 ENCOUNTER — Telehealth: Payer: Self-pay

## 2023-03-03 VITALS — BP 132/80 | HR 88 | Temp 98.0°F | Ht 68.5 in | Wt 180.0 lb

## 2023-03-03 DIAGNOSIS — Z114 Encounter for screening for human immunodeficiency virus [HIV]: Secondary | ICD-10-CM | POA: Diagnosis not present

## 2023-03-03 DIAGNOSIS — Z7984 Long term (current) use of oral hypoglycemic drugs: Secondary | ICD-10-CM

## 2023-03-03 DIAGNOSIS — E119 Type 2 diabetes mellitus without complications: Secondary | ICD-10-CM | POA: Diagnosis not present

## 2023-03-03 DIAGNOSIS — I10 Essential (primary) hypertension: Secondary | ICD-10-CM | POA: Diagnosis not present

## 2023-03-03 DIAGNOSIS — E781 Pure hyperglyceridemia: Secondary | ICD-10-CM

## 2023-03-03 LAB — BASIC METABOLIC PANEL
BUN: 14 mg/dL (ref 6–23)
CO2: 26 meq/L (ref 19–32)
Calcium: 9.3 mg/dL (ref 8.4–10.5)
Chloride: 97 meq/L (ref 96–112)
Creatinine, Ser: 0.92 mg/dL (ref 0.40–1.50)
GFR: 91.09 mL/min (ref >60.00)
Glucose, Bld: 176 mg/dL — ABNORMAL HIGH (ref 70–99)
Potassium: 4.3 meq/L (ref 3.5–5.1)
Sodium: 133 meq/L — ABNORMAL LOW (ref 135–145)

## 2023-03-03 LAB — CBC WITH DIFFERENTIAL/PLATELET
Basophils Absolute: 0 10*3/uL (ref 0.0–0.1)
Basophils Relative: 0.5 % (ref 0.0–3.0)
Eosinophils Absolute: 0.1 10*3/uL (ref 0.0–0.7)
Eosinophils Relative: 2 % (ref 0.0–5.0)
HCT: 48.5 % (ref 39.0–52.0)
Hemoglobin: 17.1 g/dL — ABNORMAL HIGH (ref 13.0–17.0)
Lymphocytes Relative: 39.2 % (ref 12.0–46.0)
Lymphs Abs: 2.6 10*3/uL (ref 0.7–4.0)
MCHC: 35.3 g/dL (ref 30.0–36.0)
MCV: 99 fL (ref 78.0–100.0)
Monocytes Absolute: 0.4 10*3/uL (ref 0.1–1.0)
Monocytes Relative: 6 % (ref 3.0–12.0)
Neutro Abs: 3.4 10*3/uL (ref 1.4–7.7)
Neutrophils Relative %: 52.3 % (ref 43.0–77.0)
Platelets: 180 10*3/uL (ref 150.0–400.0)
RBC: 4.9 Mil/uL (ref 4.22–5.81)
RDW: 12.8 % (ref 11.5–15.5)
WBC: 6.6 10*3/uL (ref 4.0–10.5)

## 2023-03-03 LAB — HEPATIC FUNCTION PANEL
ALT: 33 U/L (ref 0–53)
AST: 22 U/L (ref 0–37)
Albumin: 4.4 g/dL (ref 3.5–5.2)
Alkaline Phosphatase: 63 U/L (ref 39–117)
Bilirubin, Direct: 0.1 mg/dL (ref 0.0–0.3)
Total Bilirubin: 0.8 mg/dL (ref 0.2–1.2)
Total Protein: 7.3 g/dL (ref 6.0–8.3)

## 2023-03-03 LAB — TSH: TSH: 0.78 u[IU]/mL (ref 0.35–5.50)

## 2023-03-03 LAB — LIPID PANEL
Cholesterol: 176 mg/dL (ref 0–200)
HDL: 27.8 mg/dL — ABNORMAL LOW (ref >39.00)
LDL Cholesterol: 93 mg/dL (ref 0–99)
NonHDL: 148.11
Total CHOL/HDL Ratio: 6
Triglycerides: 276 mg/dL — ABNORMAL HIGH (ref 0.0–149.0)
VLDL: 55.2 mg/dL — ABNORMAL HIGH (ref 0.0–40.0)

## 2023-03-03 LAB — HEMOGLOBIN A1C: Hgb A1c MFr Bld: 7.5 % — ABNORMAL HIGH (ref 4.6–6.5)

## 2023-03-03 NOTE — Patient Instructions (Signed)
Schedule your complete physical in April We'll notify you of your lab results and make any changes if needed Continue to work on healthy diet and regular exercise- you're doing great! Schedule your colonoscopy Call with any questions or concerns Stay Safe!  Stay Healthy! Happy Holidays!!

## 2023-03-03 NOTE — Assessment & Plan Note (Signed)
Chronic problem, on Jardiance w/o difficulty.  UTD on eye exam, foot exam, microalbumin.  Check labs.  Adjust meds prn

## 2023-03-03 NOTE — Telephone Encounter (Signed)
Pt has reviewed via MyChart

## 2023-03-03 NOTE — Assessment & Plan Note (Signed)
Chronic problem.  On Crestor 5mg  daily and Fenofibrate 160mg  daily w/o difficulty.  Check labs.  Adjust meds prn

## 2023-03-03 NOTE — Progress Notes (Signed)
   Subjective:    Patient ID: Matthew Green, male    DOB: 02-23-1966, 57 y.o.   MRN: [id number]  HPI DM- chronic problem, on Jardiance 10mg  daily.  On ACE for renal protection.  UTD on foot exam, eye exam, microalbumin.  Denies symptomatic lows.  No numbness/tingling of hands/feet.  HTN- chronic problem, on Lisinopril 20mg  daily, Spironolactone 25mg  daily, Coreg 12.5mg  BID w/ good control.  No CP, SOB, HA's visual changes, edema.  Hyperlipidemia- chronic problem, on Crestor 5mg  daily, Fenofibrate 160mg  daily.  No abd pain, N/V.   Review of Systems For ROS see HPI     Objective:   Physical Exam Vitals reviewed.  Constitutional:      General: He is not in acute distress.    Appearance: Normal appearance. He is well-developed. He is not ill-appearing.  HENT:     Head: Normocephalic and atraumatic.  Eyes:     Extraocular Movements: Extraocular movements intact.     Conjunctiva/sclera: Conjunctivae normal.     Pupils: Pupils are equal, round, and reactive to light.  Neck:     Thyroid: No thyromegaly.  Cardiovascular:     Rate and Rhythm: Normal rate and regular rhythm.     Pulses: Normal pulses.     Heart sounds: Normal heart sounds. No murmur heard. Pulmonary:     Effort: Pulmonary effort is normal. No respiratory distress.     Breath sounds: Normal breath sounds.  Abdominal:     General: Bowel sounds are normal. There is no distension.     Palpations: Abdomen is soft.  Musculoskeletal:     Cervical back: Normal range of motion and neck supple.     Right lower leg: No edema.     Left lower leg: No edema.  Lymphadenopathy:     Cervical: No cervical adenopathy.  Skin:    General: Skin is warm and dry.  Neurological:     General: No focal deficit present.     Mental Status: He is alert and oriented to person, place, and time.     Cranial Nerves: No cranial nerve deficit.  Psychiatric:        Mood and Affect: Mood normal.        Behavior: Behavior normal.            Assessment & Plan:

## 2023-03-03 NOTE — Telephone Encounter (Signed)
-----   Message from Neena Rhymes sent at 03/03/2023  3:33 PM EST ----- Your A1C has increased from 6.9 --> 7.5%  This is likely due to holiday eating.  Please watch the carb and sugar intake.  Remainder of labs are stable.  No med changes at this time

## 2023-03-03 NOTE — Assessment & Plan Note (Signed)
Chronic problem.  Currently well controlled on Lisinopril, Spironolactone, and Coreg.  Asymptomatic.  Check labs due to ACE and diuretic but no anticipated med changes.

## 2023-03-04 LAB — HIV ANTIBODY (ROUTINE TESTING W REFLEX): HIV 1&2 Ab, 4th Generation: NONREACTIVE

## 2023-03-16 ENCOUNTER — Other Ambulatory Visit (HOSPITAL_COMMUNITY): Payer: Self-pay | Admitting: Cardiology

## 2023-03-28 ENCOUNTER — Other Ambulatory Visit (HOSPITAL_COMMUNITY): Payer: Self-pay | Admitting: Cardiology

## 2023-03-30 ENCOUNTER — Other Ambulatory Visit (HOSPITAL_COMMUNITY): Payer: Self-pay | Admitting: Cardiology

## 2023-03-30 DIAGNOSIS — I5022 Chronic systolic (congestive) heart failure: Secondary | ICD-10-CM

## 2023-05-03 ENCOUNTER — Other Ambulatory Visit: Payer: Self-pay | Admitting: Family Medicine

## 2023-05-06 ENCOUNTER — Other Ambulatory Visit (HOSPITAL_COMMUNITY): Payer: Self-pay | Admitting: Cardiology

## 2023-06-06 ENCOUNTER — Encounter: Payer: Self-pay | Admitting: Family Medicine

## 2023-06-06 NOTE — Telephone Encounter (Signed)
 Patient concerned about Micro letter that went out

## 2023-06-13 ENCOUNTER — Other Ambulatory Visit (HOSPITAL_COMMUNITY): Payer: Self-pay | Admitting: Cardiology

## 2023-06-15 ENCOUNTER — Telehealth (HOSPITAL_COMMUNITY): Payer: Self-pay | Admitting: Cardiology

## 2023-06-15 NOTE — Telephone Encounter (Signed)
 Called patient at [phone] to schedule a overdue follow up appointment with Dr. Shirlee Latch.   Patient did not answer the telephone call - - front office left patient a voice message on his telephone with instructions to call front office back to get scheduled.

## 2023-07-08 ENCOUNTER — Other Ambulatory Visit (HOSPITAL_COMMUNITY): Payer: Self-pay | Admitting: Cardiology

## 2023-07-08 DIAGNOSIS — I5022 Chronic systolic (congestive) heart failure: Secondary | ICD-10-CM

## 2023-07-12 ENCOUNTER — Other Ambulatory Visit (HOSPITAL_COMMUNITY): Payer: Self-pay | Admitting: Cardiology

## 2023-07-21 ENCOUNTER — Encounter (HOSPITAL_COMMUNITY): Payer: Self-pay | Admitting: Cardiology

## 2023-07-21 ENCOUNTER — Ambulatory Visit (HOSPITAL_COMMUNITY)
Admission: RE | Admit: 2023-07-21 | Discharge: 2023-07-21 | Disposition: A | Discharge status: Home / Self Care | Source: Ambulatory Visit | Attending: Cardiology | Admitting: Cardiology

## 2023-07-21 VITALS — BP 142/80 | HR 81 | Resp 98 | Ht 68.5 in | Wt 182.2 lb

## 2023-07-21 DIAGNOSIS — I509 Heart failure, unspecified: Secondary | ICD-10-CM | POA: Diagnosis not present

## 2023-07-21 DIAGNOSIS — R9431 Abnormal electrocardiogram [ECG] [EKG]: Secondary | ICD-10-CM | POA: Diagnosis not present

## 2023-07-21 DIAGNOSIS — Z79899 Other long term (current) drug therapy: Secondary | ICD-10-CM | POA: Insufficient documentation

## 2023-07-21 DIAGNOSIS — I5022 Chronic systolic (congestive) heart failure: Secondary | ICD-10-CM | POA: Insufficient documentation

## 2023-07-21 DIAGNOSIS — Z7984 Long term (current) use of oral hypoglycemic drugs: Secondary | ICD-10-CM | POA: Diagnosis not present

## 2023-07-21 DIAGNOSIS — F1090 Alcohol use, unspecified, uncomplicated: Secondary | ICD-10-CM | POA: Insufficient documentation

## 2023-07-21 DIAGNOSIS — Z148 Genetic carrier of other disease: Secondary | ICD-10-CM | POA: Insufficient documentation

## 2023-07-21 DIAGNOSIS — E785 Hyperlipidemia, unspecified: Secondary | ICD-10-CM | POA: Insufficient documentation

## 2023-07-21 DIAGNOSIS — Z86718 Personal history of other venous thrombosis and embolism: Secondary | ICD-10-CM | POA: Diagnosis not present

## 2023-07-21 DIAGNOSIS — Z87891 Personal history of nicotine dependence: Secondary | ICD-10-CM | POA: Insufficient documentation

## 2023-07-21 DIAGNOSIS — Z7901 Long term (current) use of anticoagulants: Secondary | ICD-10-CM | POA: Insufficient documentation

## 2023-07-21 MED ORDER — LISINOPRIL 40 MG PO TABS: 40.0000 mg | ORAL_TABLET | Freq: Every day | ORAL | 3 refills | Status: DC

## 2023-07-21 MED ORDER — APIXABAN 2.5 MG PO TABS: 2.5000 mg | ORAL_TABLET | Freq: Two times a day (BID) | ORAL | 3 refills | Status: DC

## 2023-07-21 NOTE — Patient Instructions (Signed)
 INCREASE Lisinopril  to 40 mg daily.  Labs done today, your results will be available in MyChart, we will contact you for abnormal readings.  Repeat blood work in 10 days.  Your physician has requested that you have an echocardiogram. Echocardiography is a painless test that uses sound waves to create images of your heart. It provides your doctor with information about the size and shape of your heart and how well your heart's chambers and valves are working. This procedure takes approximately one hour. There are no restrictions for this procedure. Please do NOT wear cologne, perfume, aftershave, or lotions (deodorant is allowed). Please arrive 15 minutes prior to your appointment time.  Please note: We ask at that you not bring children with you during ultrasound (echo/ vascular) testing. Due to room size and safety concerns, children are not allowed in the ultrasound rooms during exams. Our front office staff cannot provide observation of children in our lobby area while testing is being conducted. An adult accompanying a patient to their appointment will only be allowed in the ultrasound room at the discretion of the ultrasound technician under special circumstances. We apologize for any inconvenience.  Dr. Mitzie Anda has ordered a CT coronary calcium  score.   Test locations:  [facility]  [place]  [facility]  This is $99 out of pocket.   Coronary CalciumScan A coronary calcium  scan is an imaging test used to look for deposits of calcium  and other fatty materials (plaques) in the inner lining of the blood vessels of the heart (coronary arteries). These deposits of calcium  and plaques can partly clog and narrow the coronary arteries without producing any symptoms or warning signs. This puts a person at risk for a heart attack. This test can detect these deposits before symptoms develop. Tell a health care  provider about: Any allergies you have. All medicines you are taking, including vitamins, herbs, eye drops, creams, and over-the-counter medicines. Any problems you or family members have had with anesthetic medicines. Any blood disorders you have. Any surgeries you have had. Any medical conditions you have. Whether you are pregnant or may be pregnant. What are the risks? Generally, this is a safe procedure. However, problems may occur, including: Harm to a pregnant woman and her unborn baby. This test involves the use of radiation. Radiation exposure can be dangerous to a pregnant woman and her unborn baby. If you are pregnant, you generally should not have this procedure done. Slight increase in the risk of cancer. This is because of the radiation involved in the test. What happens before the procedure? No preparation is needed for this procedure. What happens during the procedure? You will undress and remove any jewelry around your neck or chest. You will put on a hospital gown. Sticky electrodes will be placed on your chest. The electrodes will be connected to an electrocardiogram (ECG) machine to record a tracing of the electrical activity of your heart. A CT scanner will take pictures of your heart. During this time, you will be asked to lie still and hold your breath for 2-3 seconds while a picture of your heart is being taken. The procedure may vary among health care providers and hospitals. What happens after the procedure? You can get dressed. You can return to your normal activities. It is up to you to get the results of your test. Ask your health care provider, or the department that is doing the test, when your results  will be ready. Summary A coronary calcium  scan is an imaging test used to look for deposits of calcium  and other fatty materials (plaques) in the inner lining of the blood vessels of the heart (coronary arteries). Generally, this is a safe procedure. Tell your  health care provider if you are pregnant or may be pregnant. No preparation is needed for this procedure. A CT scanner will take pictures of your heart. You can return to your normal activities after the scan is done. This information is not intended to replace advice given to you by your health care provider. Make sure you discuss any questions you have with your health care provider. Document Released: 09/10/2007 Document Revised: 02/01/2016 Document Reviewed: 02/01/2016 Elsevier Interactive Patient Education  2017 [employer].  Your physician recommends that you schedule a follow-up appointment in: 1 Year ( April 2026) ** PLEASE CALL THE OFFICE IN FEBRUARY 2026 TO ARRANGE YOUR FOLLOW UP APPOINTMENT.**  If you have any questions or concerns before your next appointment please send us  a message through [place] or call our office at [phone].    TO LEAVE A MESSAGE FOR THE NURSE SELECT OPTION 2, PLEASE LEAVE A MESSAGE INCLUDING: YOUR NAME DATE OF BIRTH CALL BACK NUMBER REASON FOR CALL**this is important as we prioritize the call backs  YOU WILL RECEIVE A CALL BACK THE SAME DAY AS LONG AS YOU CALL BEFORE 4:00 PM  At the [facility], you and your health needs are our priority. As part of our continuing mission to provide you with exceptional heart care, we have created designated Provider Care Teams. These Care Teams include your primary Cardiologist (physician) and Advanced Practice Providers (APPs- Physician Assistants and Nurse Practitioners) who all work together to provide you with the care you need, when you need it.   You may see any of the following providers on your designated Care Team at your next follow up: Dr Jules Oar Dr Peder Bourdon Dr. Alwin Baars Dr. Arta Lark Amy Marijane Shoulders, NP Ruddy Corral, [place] [facility] [place], [place] Dennise Fitz, NP [place] Lee, NP Shawnee Dellen, NP Luster Salters, PharmD Bevely Brush,  PharmD   Please be sure to bring in all your medications bottles to every appointment.    Thank you for choosing [facility]

## 2023-07-23 NOTE — Progress Notes (Signed)
 Date:  07/23/2023   ID:  Matthew Green, DOB 10-Jan-1966, MRN [id number]   Provider location: [address] [place] Type of Visit: Established patient  PCP:  Jess Morita, MD  Cardiologist: Dr. Mitzie Anda  Chief complaint: CHF   History of Present Illness: Matthew Green is a 58 y.o. male who has history of tobacco use and heavy ETOH use developed exertional dyspnea and orthopnea about 3 weeks prior to admission to [facility] in 12/15.  No chest pain.  He was admitted 03/21/14 and found to have a PE.  Lower extremity US  showed no DVT.  He had no known trigger: no surgery or prolonged immobility.  His father had a DVT after a leg injury.  As part of the workup, he had an echo showing EF 20-25% with diffuse hypokinesis.  He was noted to be volume overloaded and was diuresed.  He was started on apixaban .  His brother and multiple family members on his mother's side have a history of cardiomyopathy.  Of note, he has been a heavy drinker, up to 12 beers/day, since his divorce.  He was also a heavy smoker until around the time of his CHF admission, when he quit.  Echo was repeated in 4/16, showing EF 50-55% with trace MR.    Echo in 6/20 showed EF 55%, normal RV.    He returns for followup of cardiomyopathy. He is doing well overall, no exertional dyspnea or chest pain.  Walks his dog a mile a day.  No BRBPR/melena.  Weight down 3 lbs.  BP mildly elevated.    Labs  (03/25/14): K 4.6, creatinine 1.29, HCT 43.5, BNP 987, TnI 0.05, AST/ALT normal, LDL 64, TSH normal, lupus anticoagulant negative, beta-2  glycoprotein negative, anticardiolipin antibody negative. Factor V Leiden homozygote.  Negative for prothrombin gene mutation.  (03/27/14): Dig level 0.7  (1/16): K 4.2, creatinine 0.9, LFTs normal (3/16): K 3.7, creatinine 1.02, BNP 82 (4/16): K 4.2, creatinine 0.92, digoxin  0.3 (8/17): K 5, creatinine 0.87, LDL 63, TGs 484, HDL 35 (8/18): K 4.2, creatinine 1.05, hgb 14.7, TGs  187, LDL 97, HDL 29 (9/19): K 4.2, creatinine 1.05, LDL 85, TGs 342 (3/22): LDL 115, K 4.3, creatinine 1.01 (12/24): hgb 17.1, TSH normal, K 4.3, creatinine 0.92, TGs 276, LDL 93  ECG (personally reviewed): NSR, left axis deviation, inferior Qs   PMH: 1. PE: Diagnosed by CTA in 12/15. Lower extremity US  was negative.  There was no trigger.  Lupus anticoagulant negative, beta-2  glycoprotein negative, anticardiolipin antibody negative, prothrombin gene mutation negative.  Factor V Leiden heterozygote.  2. Cardiomyopathy: Echo (12/15) with EF 20-25%, mildly dilated LV with diffuse hypokinesis, RV mildly dilated with moderately decreased systolic function, PA systolic pressure 44 mmHg. Cardiac MRI (1/16) with LV EF 34%, global hypokinesis; RV moderately hypokinetic, EF 30%, possible subtle mid-wall LGE in the anterior wall (not a coronary pattern).  Echo (4/16) with EF 50-55%, trace MR.  - Echo (12/17): EF 55-60%, normal RV.  - Echo (6/20): EF 55%, normal RV 3. 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  4. Hyperlipidemia: Myalgias with atorvastatin .    Current Outpatient Medications  Medication Sig Dispense Refill   Blood Glucose Monitoring Suppl (CONTOUR NEXT MONITOR) w/Device KIT 1 Units by Does not apply route daily. Check blood sugars once daily Dx:E11.9 1 kit 0   carvedilol  (COREG ) 12.5 MG tablet TAKE 1 TABLET BY MOUTH TWICE A DAY  WITH MEAL CALL OFFICE FOR YEARLY FOLLOW UP APPT 60 tablet 0   CONTOUR NEXT TEST test strip CHECK BLOOD SUGAR ONCE DAILY DX: E11.9 100 each 12   EPINEPHrine  0.3 mg/0.3 mL IJ SOAJ injection USE AS DIRECTED ON PACKAGE AS NEEDED FOR ALLERGIC REACTION 1 each 3   fenofibrate  160 MG tablet TAKE 1 TABLET BY MOUTH EVERY DAY 90 tablet 1   JARDIANCE  10 MG TABS tablet TAKE 1 TABLET BY MOUTH EVERY DAY 90 tablet 1   rosuvastatin  (CRESTOR ) 5 MG tablet TAKE 1 TABLET (5 MG TOTAL) BY MOUTH DAILY. 90 tablet 1   spironolactone   (ALDACTONE ) 25 MG tablet TAKE 1 TABLET (25 MG TOTAL) BY MOUTH DAILY. 90 tablet 3   apixaban  (ELIQUIS ) 2.5 MG TABS tablet Take 1 tablet (2.5 mg total) by mouth 2 (two) times daily. 180 tablet 3   lisinopril  (ZESTRIL ) 40 MG tablet Take 1 tablet (40 mg total) by mouth daily. 90 tablet 3   No current facility-administered medications for this encounter.    Allergies:   Other   Social History:  The patient  reports that he quit smoking about 9 years ago. His smoking use included cigarettes. His smokeless tobacco use includes snuff. He reports current alcohol use. He reports that he does not use drugs.   Family History:  The patient's family history includes Deep vein thrombosis in his father; Heart failure in his brother and mother.   ROS:  Please see the history of present illness.   All other systems are personally reviewed and negative.   Exam:   BP (!) 142/80   Pulse 81   Resp (!) 98   Ht 5' 8.5" (1.74 m)   Wt 82.6 kg (182 lb 3.2 oz)   BMI 27.30 kg/m  General: NAD Neck: No JVD, no thyromegaly or thyroid  nodule.  Lungs: Clear to auscultation bilaterally with normal respiratory effort. CV: Nondisplaced PMI.  Heart regular S1/S2, no S3/S4, no murmur.  No peripheral edema.  No carotid bruit.  Normal pedal pulses.  Abdomen: Soft, nontender, no hepatosplenomegaly, no distention.  Skin: Intact without lesions or rashes.  Neurologic: Alert and oriented x 3.  Psych: Normal affect. Extremities: No clubbing or cyanosis.  HEENT: Normal.   Recent Labs: 03/03/2023: ALT 33; BUN 14; Creatinine, Ser 0.92; Hemoglobin 17.1; Platelets 180.0; Potassium 4.3; Sodium 133; TSH 0.78  Personally reviewed   Wt Readings from Last 3 Encounters:  07/21/23 82.6 kg (182 lb 3.2 oz)  03/03/23 81.6 kg (180 lb)  11/18/22 80.3 kg (177 lb)     ASSESSMENT AND PLAN:  1. PE: Patient developed a PE with no obvious trigger. His father had a DVT but it seems to have happened after a leg injury; brother had DVT. He is  a Factor V Leiden heterozygote. - Given spontaneous DVT with Factor V Leiden heterozygosity, would be reasonable to continue apixaban  2.5 mg bid long-term given no problems with bleeding.    2. Chronic systolic CHF => recovered: Suspect nonischemic cardiomyopathy.  Heavy ETOH use may have contributed to cardiomyopathy.  However, he does have a strong family history of CMP (brother, mother and members of mother's family).  This may be a familial cardiomyopathy. ECHO 03/22/14 EF 20-25%.  Cardiac MRI in 1/16 showed LV EF 34%, RV EF 30%.  There was subtle mid-wall anterior LGE.  This did not appear to be a CAD pattern. Repeat echo in 4/16 showed EF improved considerably to 50-55%, suggesting that heavy ETOH may have played a  large role with improvement after cutting back.  Most recent echo in 6/20 with EF 55%.  NYHA class I, not volume overloaded on exam.  - I will arrange for repeat echo.  - Continue Coreg  12.5 bid. - Continue spironolactone  25 daily.  - Continue Jardiance  10 daily.  - Increase lisinopril  to 40 daily with elevated BP.  BMET today and again in 10 days.  - I will arrange for repeat echo.  3. CAD risk: I will arrange for coronary artery calcium  score.  4. ETOH: I encouraged him to keep ETOH to a minimum.   5. Hyperlipidemia: Continue fenofibrate  and Crestor  (myalgias with atorvastatin ).     Followup in 1 year if echo stable.    I spent 32 minutes reviewing records, interviewing/examining patient, and managing orders.   Signed, Peder Bourdon, MD  07/23/2023  [facility] [address] [facility] [place] [address] [phone] (office) [phone] (fax)

## 2023-07-31 ENCOUNTER — Telehealth (HOSPITAL_COMMUNITY): Payer: Self-pay

## 2023-07-31 ENCOUNTER — Ambulatory Visit (HOSPITAL_COMMUNITY)
Admission: RE | Admit: 2023-07-31 | Discharge: 2023-07-31 | Disposition: A | Discharge status: Home / Self Care | Source: Ambulatory Visit | Attending: Internal Medicine | Admitting: Internal Medicine

## 2023-07-31 DIAGNOSIS — I5022 Chronic systolic (congestive) heart failure: Secondary | ICD-10-CM | POA: Diagnosis not present

## 2023-07-31 LAB — BASIC METABOLIC PANEL WITH GFR
Anion gap: 12 (ref 5–15)
BUN: 13 mg/dL (ref 6–20)
CO2: 22 mmol/L (ref 22–32)
Calcium: 9.2 mg/dL (ref 8.9–10.3)
Chloride: 97 mmol/L — ABNORMAL LOW (ref 98–111)
Creatinine, Ser: 0.82 mg/dL (ref 0.61–1.24)
GFR, Estimated: >60 mL/min (ref >60)
Glucose, Bld: 165 mg/dL — ABNORMAL HIGH (ref 70–99)
Potassium: 5 mmol/L (ref 3.5–5.1)
Sodium: 131 mmol/L — ABNORMAL LOW (ref 135–145)

## 2023-07-31 NOTE — Telephone Encounter (Signed)
-----   Message from Peder Bourdon sent at 07/31/2023  1:37 PM EDT ----- K high normal, follow low K diet.  Na a little low, cut back a bit on po fluid intake, especially if pushing fluid hard.

## 2023-07-31 NOTE — Telephone Encounter (Signed)
Spoke with patient regarding the following results. Patient made aware and patient verbalized understanding.   Advised patient to call back to office with any issues, questions, or concerns. Patient verbalized understanding.

## 2023-08-02 ENCOUNTER — Other Ambulatory Visit: Payer: Self-pay | Admitting: Family Medicine

## 2023-08-02 ENCOUNTER — Other Ambulatory Visit (HOSPITAL_COMMUNITY): Payer: Self-pay | Admitting: Cardiology

## 2023-08-04 ENCOUNTER — Ambulatory Visit (HOSPITAL_COMMUNITY)
Admission: RE | Admit: 2023-08-04 | Discharge: 2023-08-04 | Disposition: A | Discharge status: Home / Self Care | Payer: Self-pay | Source: Ambulatory Visit | Attending: Cardiology | Admitting: Cardiology

## 2023-08-04 DIAGNOSIS — I5022 Chronic systolic (congestive) heart failure: Secondary | ICD-10-CM | POA: Insufficient documentation

## 2023-08-11 ENCOUNTER — Other Ambulatory Visit: Payer: Self-pay | Admitting: Family Medicine

## 2023-08-11 ENCOUNTER — Encounter: Payer: Self-pay | Admitting: Family Medicine

## 2023-08-11 ENCOUNTER — Ambulatory Visit (HOSPITAL_COMMUNITY): Payer: Self-pay | Admitting: Cardiology

## 2023-08-11 ENCOUNTER — Ambulatory Visit (INDEPENDENT_AMBULATORY_CARE_PROVIDER_SITE_OTHER): Payer: BC Managed Care – PPO | Admitting: Family Medicine

## 2023-08-11 VITALS — BP 148/78 | HR 89 | Temp 97.9°F | Ht 68.5 in | Wt 178.2 lb

## 2023-08-11 DIAGNOSIS — Z7984 Long term (current) use of oral hypoglycemic drugs: Secondary | ICD-10-CM

## 2023-08-11 DIAGNOSIS — Z0000 Encounter for general adult medical examination without abnormal findings: Secondary | ICD-10-CM

## 2023-08-11 DIAGNOSIS — Z125 Encounter for screening for malignant neoplasm of prostate: Secondary | ICD-10-CM | POA: Diagnosis not present

## 2023-08-11 DIAGNOSIS — E119 Type 2 diabetes mellitus without complications: Secondary | ICD-10-CM

## 2023-08-11 LAB — BASIC METABOLIC PANEL WITH GFR
BUN: 17 mg/dL (ref 6–23)
CO2: 24 meq/L (ref 19–32)
Calcium: 9.3 mg/dL (ref 8.4–10.5)
Chloride: 99 meq/L (ref 96–112)
Creatinine, Ser: 0.82 mg/dL (ref 0.40–1.50)
GFR: 95.89 mL/min (ref >60.00)
Glucose, Bld: 162 mg/dL — ABNORMAL HIGH (ref 70–99)
Potassium: 4.4 meq/L (ref 3.5–5.1)
Sodium: 134 meq/L — ABNORMAL LOW (ref 135–145)

## 2023-08-11 LAB — HEPATIC FUNCTION PANEL
ALT: 25 U/L (ref 0–53)
AST: 18 U/L (ref 0–37)
Albumin: 4.6 g/dL (ref 3.5–5.2)
Alkaline Phosphatase: 63 U/L (ref 39–117)
Bilirubin, Direct: 0.1 mg/dL (ref 0.0–0.3)
Total Bilirubin: 0.7 mg/dL (ref 0.2–1.2)
Total Protein: 7.1 g/dL (ref 6.0–8.3)

## 2023-08-11 LAB — LIPID PANEL
Cholesterol: 180 mg/dL (ref 0–200)
HDL: 27.8 mg/dL — ABNORMAL LOW (ref >39.00)
LDL Cholesterol: 95 mg/dL (ref 0–99)
NonHDL: 152.45
Total CHOL/HDL Ratio: 6
Triglycerides: 287 mg/dL — ABNORMAL HIGH (ref 0.0–149.0)
VLDL: 57.4 mg/dL — ABNORMAL HIGH (ref 0.0–40.0)

## 2023-08-11 LAB — CBC WITH DIFFERENTIAL/PLATELET
Basophils Absolute: 0 10*3/uL (ref 0.0–0.1)
Basophils Relative: 0.8 % (ref 0.0–3.0)
Eosinophils Absolute: 0.2 10*3/uL (ref 0.0–0.7)
Eosinophils Relative: 3 % (ref 0.0–5.0)
HCT: 47 % (ref 39.0–52.0)
Hemoglobin: 16.2 g/dL (ref 13.0–17.0)
Lymphocytes Relative: 39.1 % (ref 12.0–46.0)
Lymphs Abs: 2.3 10*3/uL (ref 0.7–4.0)
MCHC: 34.4 g/dL (ref 30.0–36.0)
MCV: 98.5 fl (ref 78.0–100.0)
Monocytes Absolute: 0.4 10*3/uL (ref 0.1–1.0)
Monocytes Relative: 6.3 % (ref 3.0–12.0)
Neutro Abs: 3 10*3/uL (ref 1.4–7.7)
Neutrophils Relative %: 50.8 % (ref 43.0–77.0)
Platelets: 176 10*3/uL (ref 150.0–400.0)
RBC: 4.77 Mil/uL (ref 4.22–5.81)
RDW: 12.1 % (ref 11.5–15.5)
WBC: 5.9 10*3/uL (ref 4.0–10.5)

## 2023-08-11 LAB — HEMOGLOBIN A1C: Hgb A1c MFr Bld: 7.3 % — ABNORMAL HIGH (ref 4.6–6.5)

## 2023-08-11 MED ORDER — SPIRONOLACTONE 25 MG PO TABS: ORAL_TABLET | ORAL | 3 refills | Status: AC

## 2023-08-11 MED ORDER — EPINEPHRINE 0.3 MG/0.3ML IJ SOAJ: INTRAMUSCULAR | 3 refills | Status: AC

## 2023-08-11 NOTE — Telephone Encounter (Signed)
 Please call pharmacy and see if there is a preferred option or initiate a prior auth.

## 2023-08-11 NOTE — Patient Instructions (Addendum)
 Follow up in 3-4 months to recheck sugar We'll notify you of your lab results and make any changes if needed Continue to work on healthy diet and regular exercise- you're doing great! Schedule your colonoscopy at your convenience Call with any questions or concerns Stay Safe!  Stay Healthy! Have a great summer!!!

## 2023-08-11 NOTE — Telephone Encounter (Signed)
 This is apparently not covered is there an alternative?

## 2023-08-11 NOTE — Assessment & Plan Note (Signed)
 Pt's PE WNL w/ exception of elevated BP (Cardiology changed medication).  Due for repeat colonoscopy- pt to schedule.  Check labs.  Anticipatory guidance provided.

## 2023-08-11 NOTE — Progress Notes (Signed)
   Subjective:    Patient ID: Matthew Green, male    DOB: [birth year], 58 y.o.   MRN: [id number]  HPI CPE- due for repeat colonoscopy (Dr Leonia Raman).  Due for foot exam.  UTD on microalbumin, eye exam.  Patient Care Team    Relationship Specialty Notifications Start End  Jess Morita, MD PCP - General Family Medicine  04/03/14   Darlis Eisenmenger, MD PCP - [facility] Admissions 04/12/19   Zara Heymann, MD Referring Physician Allergy  11/11/16   Arminda Berth, OD Consulting Physician Optometry  11/11/16     Health Maintenance  Topic Date Due   Zoster Vaccines- Shingrix (1 of 2) Never done   Pneumococcal Vaccine 79-52 Years old (2 of 2 - PCV) 10/28/2016   Colonoscopy  05/07/2022   Fecal DNA (Cologuard)  03/02/2023   HEMOGLOBIN A1C  09/01/2023   INFLUENZA VACCINE  10/27/2023   OPHTHALMOLOGY EXAM  11/03/2023   Diabetic kidney evaluation - Urine ACR  11/18/2023   Diabetic kidney evaluation - eGFR measurement  07/30/2024   FOOT EXAM  08/10/2024   DTaP/Tdap/Td (3 - Td or Tdap) 06/03/2028   Hepatitis C Screening  Completed   HIV Screening  Completed   HPV VACCINES  Aged Out   Meningococcal B Vaccine  Aged Out   COVID-19 Vaccine  Discontinued       Review of Systems Patient reports no vision/hearing changes, anorexia, fever ,adenopathy, persistant/recurrent hoarseness, swallowing issues, chest pain, palpitations, edema, persistant/recurrent cough, hemoptysis, dyspnea (rest,exertional, paroxysmal nocturnal), gastrointestinal  bleeding (melena, rectal bleeding), abdominal pain, excessive heart burn, GU symptoms (dysuria, hematuria, voiding/incontinence issues) syncope, focal weakness, memory loss, numbness & tingling, skin/hair/nail changes, depression, anxiety, abnormal bruising/bleeding, musculoskeletal symptoms/signs.     Objective:   Physical Exam General Appearance:    Alert, cooperative, no distress, appears stated age  Head:    Normocephalic, without  obvious abnormality, atraumatic  Eyes:    PERRL, conjunctiva/corneas clear, EOM's intact both eyes       Ears:    Normal TM's and external ear canals, both ears  Nose:   Nares normal, septum midline, mucosa normal, no drainage   or sinus tenderness  Throat:   Lips, mucosa, and tongue normal; teeth and gums normal  Neck:   Supple, symmetrical, trachea midline, no adenopathy;       thyroid :  No enlargement/tenderness/nodules  Back:     Symmetric, no curvature, ROM normal, no CVA tenderness  Lungs:     Clear to auscultation bilaterally, respirations unlabored  Chest wall:    No tenderness or deformity  Heart:    Regular rate and rhythm, S1 and S2 normal, no murmur, rub   or gallop  Abdomen:     Soft, non-tender, bowel sounds active all four quadrants,    no masses, no organomegaly  Genitalia:    deferred  Rectal:    Extremities:   Extremities normal, atraumatic, no cyanosis or edema  Pulses:   2+ and symmetric all extremities  Skin:   Skin color, texture, turgor normal, no rashes or lesions  Lymph nodes:   Cervical, supraclavicular, and axillary nodes normal  Neurologic:   CNII-XII intact. Normal strength, sensation and reflexes      throughout          Assessment & Plan:

## 2023-08-13 ENCOUNTER — Other Ambulatory Visit (HOSPITAL_COMMUNITY): Payer: Self-pay | Admitting: Cardiology

## 2023-08-15 ENCOUNTER — Ambulatory Visit: Payer: Self-pay | Admitting: Family Medicine

## 2023-08-15 ENCOUNTER — Encounter: Payer: Self-pay | Admitting: Family Medicine

## 2023-08-15 LAB — PSA: PSA: 0.54 ng/mL (ref 0.10–4.00)

## 2023-08-15 LAB — TSH: TSH: 1.09 u[IU]/mL (ref 0.35–5.50)

## 2023-08-16 NOTE — Telephone Encounter (Signed)
 Spoke with patient regarding the following results. Patient made aware and patient verbalized understanding.   Patient reports that he does not like statin medications- would like non statin options to lower LDL- according to last lipid completed on 08/11/23 was 95- please see chart review labs tab.   Patient would also like to know if he needs to start the aspirin 81mg  with this eliquis  2.5mg  bid that he currently takes.   Will forward message to Dr. Mclean for him to review and advise.

## 2023-08-16 NOTE — Telephone Encounter (Signed)
-----   Message from Peder Bourdon sent at 08/11/2023  3:58 PM EDT ----- Calcium  score is elevated, suggests increased risk for coronary disease/heart attack.  Need to make sure he is taking aspirin 81 mg daily.  Need to get LDL < 55, has lipid check today and will decide on dose from the result.

## 2023-08-16 NOTE — Telephone Encounter (Signed)
 Patient notes he is going to be re starting statin and will see you in 3 months for update

## 2023-09-06 ENCOUNTER — Ambulatory Visit (HOSPITAL_COMMUNITY)
Admission: RE | Admit: 2023-09-06 | Discharge: 2023-09-06 | Disposition: A | Discharge status: Home / Self Care | Source: Ambulatory Visit | Attending: Cardiology | Admitting: Cardiology

## 2023-09-06 DIAGNOSIS — F17200 Nicotine dependence, unspecified, uncomplicated: Secondary | ICD-10-CM | POA: Insufficient documentation

## 2023-09-06 DIAGNOSIS — I5022 Chronic systolic (congestive) heart failure: Secondary | ICD-10-CM | POA: Insufficient documentation

## 2023-09-06 DIAGNOSIS — I517 Cardiomegaly: Secondary | ICD-10-CM

## 2023-09-06 DIAGNOSIS — I358 Other nonrheumatic aortic valve disorders: Secondary | ICD-10-CM

## 2023-09-06 DIAGNOSIS — I110 Hypertensive heart disease with heart failure: Secondary | ICD-10-CM | POA: Diagnosis not present

## 2023-09-06 LAB — ECHOCARDIOGRAM COMPLETE
Area-P 1/2: 5.58 cm2
Calc EF: 41.2 %
S' Lateral: 4.7 cm
Single Plane A2C EF: 42.6 %
Single Plane A4C EF: 39.5 %

## 2023-10-11 DIAGNOSIS — Z85828 Personal history of other malignant neoplasm of skin: Secondary | ICD-10-CM | POA: Diagnosis not present

## 2023-10-11 DIAGNOSIS — D225 Melanocytic nevi of trunk: Secondary | ICD-10-CM | POA: Diagnosis not present

## 2023-10-11 DIAGNOSIS — L821 Other seborrheic keratosis: Secondary | ICD-10-CM | POA: Diagnosis not present

## 2023-10-11 DIAGNOSIS — L812 Freckles: Secondary | ICD-10-CM | POA: Diagnosis not present

## 2023-10-11 DIAGNOSIS — L570 Actinic keratosis: Secondary | ICD-10-CM | POA: Diagnosis not present

## 2023-10-11 DIAGNOSIS — C44329 Squamous cell carcinoma of skin of other parts of face: Secondary | ICD-10-CM | POA: Diagnosis not present

## 2023-11-08 ENCOUNTER — Other Ambulatory Visit: Payer: Self-pay | Admitting: Family Medicine

## 2023-11-09 ENCOUNTER — Telehealth (HOSPITAL_COMMUNITY): Payer: Self-pay | Admitting: Cardiology

## 2023-11-09 NOTE — Telephone Encounter (Signed)
 Called to confirm/remind patient of their appointment at the [facility] on 11/09/2023.   Appointment:   [] Confirmed  [x] Left mess   [] No answer/No voice mail  [] VM Full/unable to leave message  [] Phone not in service  Patient reminded to bring all medications and/or complete list.  Confirmed patient has transportation. Gave directions, instructed to utilize valet parking.

## 2023-11-10 ENCOUNTER — Ambulatory Visit: Admitting: Family Medicine

## 2023-11-10 ENCOUNTER — Encounter: Payer: Self-pay | Admitting: Family Medicine

## 2023-11-10 ENCOUNTER — Encounter (HOSPITAL_COMMUNITY): Payer: Self-pay | Admitting: Cardiology

## 2023-11-10 ENCOUNTER — Telehealth (HOSPITAL_COMMUNITY): Payer: Self-pay | Admitting: Pharmacy Technician

## 2023-11-10 ENCOUNTER — Other Ambulatory Visit (HOSPITAL_COMMUNITY): Payer: Self-pay

## 2023-11-10 ENCOUNTER — Other Ambulatory Visit (HOSPITAL_COMMUNITY): Payer: Self-pay | Admitting: *Deleted

## 2023-11-10 ENCOUNTER — Ambulatory Visit (HOSPITAL_COMMUNITY)
Admission: RE | Admit: 2023-11-10 | Discharge: 2023-11-10 | Disposition: A | Discharge status: Home / Self Care | Source: Ambulatory Visit | Attending: Cardiology | Admitting: Cardiology

## 2023-11-10 VITALS — BP 136/72 | HR 77 | Temp 97.7°F | Resp 16 | Ht 68.5 in | Wt 176.4 lb

## 2023-11-10 VITALS — BP 130/80 | HR 77 | Wt 174.6 lb

## 2023-11-10 DIAGNOSIS — Z7984 Long term (current) use of oral hypoglycemic drugs: Secondary | ICD-10-CM | POA: Diagnosis not present

## 2023-11-10 DIAGNOSIS — E785 Hyperlipidemia, unspecified: Secondary | ICD-10-CM | POA: Diagnosis not present

## 2023-11-10 DIAGNOSIS — E781 Pure hyperglyceridemia: Secondary | ICD-10-CM

## 2023-11-10 DIAGNOSIS — R9431 Abnormal electrocardiogram [ECG] [EKG]: Secondary | ICD-10-CM | POA: Diagnosis not present

## 2023-11-10 DIAGNOSIS — E119 Type 2 diabetes mellitus without complications: Secondary | ICD-10-CM

## 2023-11-10 DIAGNOSIS — I2510 Atherosclerotic heart disease of native coronary artery without angina pectoris: Secondary | ICD-10-CM | POA: Insufficient documentation

## 2023-11-10 DIAGNOSIS — Z86711 Personal history of pulmonary embolism: Secondary | ICD-10-CM | POA: Insufficient documentation

## 2023-11-10 DIAGNOSIS — Z79899 Other long term (current) drug therapy: Secondary | ICD-10-CM | POA: Insufficient documentation

## 2023-11-10 DIAGNOSIS — I5022 Chronic systolic (congestive) heart failure: Secondary | ICD-10-CM | POA: Insufficient documentation

## 2023-11-10 DIAGNOSIS — D6851 Activated protein C resistance: Secondary | ICD-10-CM | POA: Insufficient documentation

## 2023-11-10 DIAGNOSIS — Z8249 Family history of ischemic heart disease and other diseases of the circulatory system: Secondary | ICD-10-CM | POA: Insufficient documentation

## 2023-11-10 DIAGNOSIS — Z87891 Personal history of nicotine dependence: Secondary | ICD-10-CM | POA: Insufficient documentation

## 2023-11-10 DIAGNOSIS — Z7901 Long term (current) use of anticoagulants: Secondary | ICD-10-CM | POA: Diagnosis not present

## 2023-11-10 DIAGNOSIS — I429 Cardiomyopathy, unspecified: Secondary | ICD-10-CM | POA: Insufficient documentation

## 2023-11-10 DIAGNOSIS — F1090 Alcohol use, unspecified, uncomplicated: Secondary | ICD-10-CM | POA: Insufficient documentation

## 2023-11-10 LAB — BASIC METABOLIC PANEL WITH GFR
BUN: 15 mg/dL (ref 6–23)
CO2: 25 meq/L (ref 19–32)
Calcium: 9.4 mg/dL (ref 8.4–10.5)
Chloride: 97 meq/L (ref 96–112)
Creatinine, Ser: 0.82 mg/dL (ref 0.40–1.50)
GFR: 95.73 mL/min (ref >60.00)
Glucose, Bld: 146 mg/dL — ABNORMAL HIGH (ref 70–99)
Potassium: 4.4 meq/L (ref 3.5–5.1)
Sodium: 132 meq/L — ABNORMAL LOW (ref 135–145)

## 2023-11-10 LAB — MICROALBUMIN / CREATININE URINE RATIO
Creatinine,U: 34.3 mg/dL
Microalb Creat Ratio: UNDETERMINED mg/g (ref 0.0–30.0)
Microalb, Ur: <0.7 mg/dL

## 2023-11-10 LAB — HEMOGLOBIN A1C: Hgb A1c MFr Bld: 6.6 % — ABNORMAL HIGH (ref 4.6–6.5)

## 2023-11-10 MED ORDER — SACUBITRIL-VALSARTAN 49-51 MG PO TABS: 1.0000 | ORAL_TABLET | Freq: Two times a day (BID) | ORAL | 3 refills | Status: DC

## 2023-11-10 NOTE — Patient Instructions (Signed)
 Medication Changes:  STOP Lisinopril   START Entresto  49/51 mg Twice daily *STARTING MONDAY 8/18  Lab Work:  Your physician recommends that you return for lab work in: 2 weeks  Testing/Procedures:  Your physician has requested that you have a cardiac catheterization. Cardiac catheterization is used to diagnose and/or treat various heart conditions. Doctors may recommend this procedure for a number of different reasons. The most common reason is to evaluate chest pain. Chest pain can be a symptom of coronary artery disease (CAD), and cardiac catheterization can show whether plaque is narrowing or blocking your heart's arteries. This procedure is also used to evaluate the valves, as well as measure the blood flow and oxygen levels in different parts of your heart. For further information please visit [email or web address] Please follow instructions below.  Referrals:  You have been referred to [facility], they will call you for an appointment   Special Instructions // Education:  Do the following things EVERYDAY: Weigh yourself in the morning before breakfast. Write it down and keep it in a log. Take your medicines as prescribed Eat low salt foods--Limit salt (sodium) to 2000 mg per day.  Stay as active as you can everyday Limit all fluids for the day to less than 2 liters   CATHETERIZATION INSTRUCTIONS:  You are scheduled for a Cardiac Catheterization on Thursday, September 4 with Dr. Ezra Shuck.  1. Please arrive at the [facility] (Main Entrance A) at [facility]: [address] [place], [place] [address] at 8:30 AM (This time is 2 hour(s) before your procedure to ensure your preparation).   Free valet parking service is available. You will check in at ADMITTING. The support person will be asked to wait in the waiting room.  It is OK to have someone drop you off and come back when you are ready to be discharged.    Special note: Every effort is made to have your  procedure done on time. Please understand that emergencies sometimes delay scheduled procedures.  2. Diet: No solid foods after midnight. You may have clear liquids until you arrive at the hospital.  List of approved liquids water, clear juice, clear tea, black coffee, fruit juices, non-citric and without pulp, carbonated beverages, Gatorade, Kool -Aid, plain Jello-O and plain ice popsicles.   3. Hydration: You may drink approved liquids (see below) until you arrive at the hospital. On the way to the hospital, please drink 8 oz (1/2 plastic bottle) of water.    List of approved liquids water, clear juice, clear tea, black coffee, fruit juices, non-citric and without pulp, carbonated beverages, Gatorade, Kool -Aid, plain Jello-O and plain ice popsicles.    4. Labs: FRIDAY 11/24/23 AT 8:30  5. Medication instructions in preparation for your procedure:   DO NOT TAKE ELIQUIS  WEDNESDAY 9/3 OR THURSDAY 9/4  THURSDAY 9/4 AM DO NOT TAKE: Jardiance , Spironolactone , or Eliquis    On the morning of your procedure, take any morning medicines NOT listed above.  You may use sips of water.  6. Plan to go home the same day, you will only stay overnight if medically necessary. 7. Bring a current list of your medications and current insurance cards. 8. You MUST have a responsible person to drive you home. 9. Someone MUST be with you the first 24 hours after you arrive home or your discharge will be delayed. 10. Please wear clothes that are easy to get on and off and wear slip-on shoes.  Thank you for allowing us  to care  for you!   -- [facility]   Follow-Up in: 2-3 weeks after your catheterization    At the [facility], you and your health needs are our priority. We have a designated team specialized in the treatment of Heart Failure. This Care Team includes your primary Heart Failure Specialized Cardiologist (physician), Advanced Practice Providers  (APPs- Physician Assistants and Nurse Practitioners), and Pharmacist who all work together to provide you with the care you need, when you need it.   You may see any of the following providers on your designated Care Team at your next follow up:  Dr. Toribio Fuel Dr. Ezra Shuck Dr. Ria Commander Dr. Odis Brownie Greig Mosses, NP Caffie Shed, [place] [facility] [place], [place] Beckey Coe, NP [place] Lee, NP Tinnie Redman, PharmD   Please be sure to bring in all your medications bottles to every appointment.   Need to Contact Us :  If you have any questions or concerns before your next appointment please send us  a message through [place] or call our office at [phone].    TO LEAVE A MESSAGE FOR THE NURSE SELECT OPTION 2, PLEASE LEAVE A MESSAGE INCLUDING: YOUR NAME DATE OF BIRTH CALL BACK NUMBER REASON FOR CALL**this is important as we prioritize the call backs  YOU WILL RECEIVE A CALL BACK THE SAME DAY AS LONG AS YOU CALL BEFORE 4:00 PM

## 2023-11-10 NOTE — Assessment & Plan Note (Signed)
 Ongoing issue for pt.  Tolerating Jardiance  w/o difficulty.  UTD on foot exam.  Pt to schedule eye exam.  Will get A1C and microalbumin today and adjust meds accordingly.  Pt expressed understanding and is in agreement w/ plan.

## 2023-11-10 NOTE — Progress Notes (Signed)
   Subjective:    Patient ID: Matthew Green, male    DOB: 06-08-1965, 58 y.o.   MRN: [id number]  HPI DM- chronic problem, on Jardiance  10mg  daily.  On Lisinopril  for renal protection.  Last A1C 7.3%  Due for repeat eye exam, UTD on foot exam.  Due for microalbumin.  Pt reports feeling good.  Continues to get 10-12,000 steps daily.  No CP, SOB, HA's, visual changes, edema.  No abd pain, N/V.  No numbness/tingling of hands/feet.   Review of Systems For ROS see HPI     Objective:   Physical Exam Vitals reviewed.  Constitutional:      General: He is not in acute distress.    Appearance: Normal appearance. He is well-developed. He is not ill-appearing.  HENT:     Head: Normocephalic and atraumatic.  Eyes:     Extraocular Movements: Extraocular movements intact.     Conjunctiva/sclera: Conjunctivae normal.     Pupils: Pupils are equal, round, and reactive to light.  Neck:     Thyroid : No thyromegaly.  Cardiovascular:     Rate and Rhythm: Normal rate and regular rhythm.     Pulses: Normal pulses.     Heart sounds: Normal heart sounds. No murmur heard. Pulmonary:     Effort: Pulmonary effort is normal. No respiratory distress.     Breath sounds: Normal breath sounds.  Abdominal:     General: Bowel sounds are normal. There is no distension.     Palpations: Abdomen is soft.  Musculoskeletal:     Cervical back: Normal range of motion and neck supple.     Right lower leg: No edema.     Left lower leg: No edema.  Lymphadenopathy:     Cervical: No cervical adenopathy.  Skin:    General: Skin is warm and dry.  Neurological:     General: No focal deficit present.     Mental Status: He is alert and oriented to person, place, and time.     Cranial Nerves: No cranial nerve deficit.  Psychiatric:        Mood and Affect: Mood normal.        Behavior: Behavior normal.           Assessment & Plan:

## 2023-11-10 NOTE — Patient Instructions (Signed)
 Follow up in 3-4 months to recheck BP, cholesterol, diabetes We'll notify you of your lab results and make any changes if needed Keep up the good work on healthy diet and regular exercise- you look great! Call with any questions or concerns Stay Safe!  Stay Healthy! Enjoy the rest of your summer!!!

## 2023-11-10 NOTE — Telephone Encounter (Signed)
 Patient Product/process development scientist completed.    The patient is insured through [facility]. Patient has [employer], may use a copay card, and/or apply for patient assistance if available.    Ran test claim for Entresto  24-26 mg and the current 30 day co-pay is $100.00.  Ran test claim for sacubitril -valsartan  (Entresto ) 24-26 mg and the current 30 day co-pay is $100.00.  This test claim was processed through [facility]- copay amounts may vary at other pharmacies due to pharmacy/plan contracts, or as the patient moves through the different stages of their insurance plan.     Reyes Sharps, CPHT Pharmacy Technician III Certified Patient Advocate [facility] Patient Advocate Team Direct Number: [phone]  Fax: [phone]

## 2023-11-10 NOTE — Progress Notes (Signed)
 Medication Samples have been provided to the patient.  Drug name: Entresto        Strength: 49/51 mg        Qty: 2  LOT: [id number]  Exp.Date: 10/2024  Dosing instructions: take 1 tab Twice daily   The patient has been instructed regarding the correct time, dose, and frequency of taking this medication, including desired effects and most common side effects.   Ladine Kiper 1:19 PM 11/10/2023

## 2023-11-11 NOTE — Progress Notes (Signed)
 Date:  11/11/2023   ID:  [name], DOB 1965/06/25, MRN [id number]   Provider location: [address] [place] Type of Visit: Established patient  PCP:  Mahlon Comer BRAVO, MD  Cardiologist: Dr. Rolan  Chief complaint: CHF   History of Present Illness: RHET RORKE is a 58 y.o. male who has history of tobacco use and heavy ETOH use developed exertional dyspnea and orthopnea about 3 weeks prior to admission to [facility] in 12/15.  No chest pain.  He was admitted 03/21/14 and found to have a PE.  Lower extremity US  showed no DVT.  He had no known trigger: no surgery or prolonged immobility.  His father had a DVT after a leg injury.  As part of the workup, he had an echo showing EF 20-25% with diffuse hypokinesis.  He was noted to be volume overloaded and was diuresed.  He was started on apixaban .  His brother and multiple family members on his mother's side have a history of cardiomyopathy.  Of note, he has been a heavy drinker, up to 12 beers/day, since his divorce.  He was also a heavy smoker until around the time of his CHF admission, when he quit.  Echo was repeated in 4/16, showing EF 50-55% with trace MR.    Echo in 6/20 showed EF 55%, normal RV.   Coronary calcium  score was done in 5/25, score was 1366 Agatston units (94th percentile).  Echo in 6/25 showed EF down to 40-45% with moderate LV dilation and normal RV.     He returns for followup of cardiomyopathy. Weight is down 12 lbs.  With markedly abnormal calcium  score and low EF on echo, he changed his diet and worked on increased exercise.  He is not drinking ETOH.  He was not taking Crestor  due to myalgias but restarted 5 mg daily in 5/25. He does not think that he can increase the dose any further due to myalgias.  No chest pain.  He walks 3-4 miles/day with his dog, no exertional dyspnea.  No palpitations/lightheadedness.    ECG (personally reviewed): NSR, septal Qs   Labs  (03/25/14): K 4.6,  creatinine 1.29, HCT 43.5, BNP 987, TnI 0.05, AST/ALT normal, LDL 64, TSH normal, lupus anticoagulant negative, beta-2  glycoprotein negative, anticardiolipin antibody negative. Factor V Leiden homozygote.  Negative for prothrombin gene mutation.  (12/24): hgb 17.1, TSH normal, K 4.3, creatinine 0.92, TGs 276, LDL 93 (5/25): K 4.4, creatinine 0.82, LDL 95, TGs 251   PMH: 1. PE: Diagnosed by CTA in 12/15. Lower extremity US  was negative.  There was no trigger.  Lupus anticoagulant negative, beta-2  glycoprotein negative, anticardiolipin antibody negative, prothrombin gene mutation negative.  Factor V Leiden heterozygote.  2. Cardiomyopathy: Echo (12/15) with EF 20-25%, mildly dilated LV with diffuse hypokinesis, RV mildly dilated with moderately decreased systolic function, PA systolic pressure 44 mmHg. Cardiac MRI (1/16) with LV EF 34%, global hypokinesis; RV moderately hypokinetic, EF 30%, possible subtle mid-wall LGE in the anterior wall (not a coronary pattern).  Echo (4/16) with EF 50-55%, trace MR.  - Echo (12/17): EF 55-60%, normal RV.  - Echo (6/20): EF 55%, normal RV - Echo (6/25): EF down to 40-45% with moderate LV dilation and normal RV.   3. 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  4. Hyperlipidemia: Myalgias with atorvastatin .   5. CAD: Coronary calcium  score 1366 Agatston units, 99th percentile for age and  gender.   Current Outpatient Medications  Medication Sig Dispense Refill   apixaban  (ELIQUIS ) 2.5 MG TABS tablet Take 1 tablet (2.5 mg total) by mouth 2 (two) times daily. 180 tablet 3   Blood Glucose Monitoring Suppl (CONTOUR NEXT MONITOR) w/Device KIT 1 Units by Does not apply route daily. Check blood sugars once daily Dx:E11.9 1 kit 0   carvedilol  (COREG ) 12.5 MG tablet TAKE 1 TABLET BY MOUTH TWICE A DAY WITH MEAL CALL OFFICE FOR YEARLY FOLLOW UP APPT 180 tablet 1   EPINEPHrine  0.3 mg/0.3 mL IJ SOAJ injection USE AS DIRECTED  ON PACKAGE AS NEEDED FOR ALLERGIC REACTION 1 each 3   JARDIANCE  10 MG TABS tablet TAKE 1 TABLET BY MOUTH EVERY DAY 90 tablet 1   rosuvastatin  (CRESTOR ) 5 MG tablet TAKE 1 TABLET (5 MG TOTAL) BY MOUTH DAILY. 90 tablet 1   [START ON 11/13/2023] sacubitril -valsartan  (ENTRESTO ) 49-51 MG Take 1 tablet by mouth 2 (two) times daily. 60 tablet 3   spironolactone  (ALDACTONE ) 25 MG tablet TAKE 1 TABLET (25 MG TOTAL) BY MOUTH DAILY. 90 tablet 3   fenofibrate  160 MG tablet TAKE 1 TABLET BY MOUTH EVERY DAY (Patient not taking: Reported on 11/10/2023) 90 tablet 1   No current facility-administered medications for this encounter.    Allergies:   Other   Social History:  The patient  reports that he quit smoking about 9 years ago. His smoking use included cigarettes. His smokeless tobacco use includes snuff. He reports current alcohol use. He reports that he does not use drugs.   Family History:  The patient's family history includes Deep vein thrombosis in his father; Heart failure in his brother and mother.   ROS:  Please see the history of present illness.   All other systems are personally reviewed and negative.   Exam:   BP 130/80   Pulse 77   Wt 79.2 kg (174 lb 9.6 oz)   SpO2 98%   BMI 26.16 kg/m  General: NAD Neck: No JVD, no thyromegaly or thyroid  nodule.  Lungs: Clear to auscultation bilaterally with normal respiratory effort. CV: Nondisplaced PMI.  Heart regular S1/S2, no S3/S4, no murmur.  No peripheral edema.  No carotid bruit.  Normal pedal pulses.  Abdomen: Soft, nontender, no hepatosplenomegaly, no distention.  Skin: Intact without lesions or rashes.  Neurologic: Alert and oriented x 3.  Psych: Normal affect. Extremities: No clubbing or cyanosis.  HEENT: Normal.   Wt Readings from Last 3 Encounters:  11/10/23 79.2 kg (174 lb 9.6 oz)  11/10/23 80 kg (176 lb 6.4 oz)  08/11/23 80.9 kg (178 lb 4 oz)     ASSESSMENT AND PLAN:  1. PE: Patient developed a PE with no obvious trigger.  His father had a DVT but it seems to have happened after a leg injury; brother had DVT. He is a Factor V Leiden heterozygote. - Given spontaneous DVT with Factor V Leiden heterozygosity, would be reasonable to continue apixaban  2.5 mg bid long-term given no problems with bleeding.    2. Chronic systolic CHF => recovered: Suspect nonischemic cardiomyopathy.  Heavy ETOH use may have contributed to cardiomyopathy.  However, he does have a strong family history of CMP (brother, mother and members of mother's family).  This may be a familial cardiomyopathy. ECHO 12/15 EF 20-25%.  Cardiac MRI in 1/16 showed LV EF 34%, RV EF 30%, subtle mid-wall anterior LGE.  This did not appear to be a CAD pattern. Repeat echo in 4/16 showed EF  improved considerably to 50-55%, suggesting that heavy ETOH may have played a large role with improvement after cutting back.  Echo in 6/20 with EF 55%.  Most recent echo in 6/25 showed EF back down again to 40-45%.  With very high calcium  score and septal Qs on ECG, I worry that CAD could be the cause for the new drop in EF.  However, symptoms are NYHA class I, not volume overloaded on exam.  - Continue Coreg  12.5 bid. - Continue spironolactone  25 daily.  - Continue Jardiance  10 daily.  - Stop lisinopril , after 36 hrs, will start Entresto  49/51 bid. BMET/BNP today and BMET in 10 days.  3. CAD: Calcium  score 1366 in 5/25, this places the patient in the 99th percentile for age and gender and suggests high risk for future cardiac events.  He is not symptomatic, but EF is lower at 40-45% and he has septal Qs on ECG.   - Given fall in EF and evidence for coronary disease by calcium  score, I am going to arrange for RHC/LHC . With the markedly high calcium  score, I do worry that he is at risk for 3 vessel disease that may benefit from CABG.  We discussed risks/benefits and he agrees to procedure. Hold Eliquis  the day before and day of procedure.  - He has restarted Crestor  but cannot take more  than 5 mg daily without debilitating myalgias.  I will refer him to lipid clinic to start Repatha.  4. ETOH: I encouraged him to keep ETOH to a minimum, he rarely drinks now. .   5. Hyperlipidemia: Myalgias with atorvastatin  and Crestor .  - Refer to lipid clinic to start Repatha.  Goal LDL < 55.   Followup with APP after cath.    I spent 42 minutes reviewing records, interviewing/examining patient, and managing orders.   Signed, Ezra Shuck, MD  11/11/2023  [facility] [address] [facility] [place] [address] [phone] (office) [phone] (fax)

## 2023-11-13 ENCOUNTER — Ambulatory Visit: Payer: Self-pay | Admitting: Family Medicine

## 2023-11-13 ENCOUNTER — Other Ambulatory Visit (HOSPITAL_COMMUNITY): Payer: Self-pay

## 2023-11-13 DIAGNOSIS — C44329 Squamous cell carcinoma of skin of other parts of face: Secondary | ICD-10-CM | POA: Diagnosis not present

## 2023-11-13 NOTE — Progress Notes (Signed)
 Pt has reviewed labs via MyChart

## 2023-11-14 ENCOUNTER — Other Ambulatory Visit (HOSPITAL_COMMUNITY): Payer: Self-pay

## 2023-11-20 ENCOUNTER — Other Ambulatory Visit (HOSPITAL_COMMUNITY): Payer: Self-pay

## 2023-11-20 ENCOUNTER — Telehealth (HOSPITAL_COMMUNITY): Payer: Self-pay

## 2023-11-20 NOTE — Telephone Encounter (Signed)
 [facility] Patient Advocate Encounter  Review of patient chart regarding cost of Entresto . Test billing indicates $100 copay per month for brand or generic. This patient may be eligible to use copay savings card which would reduce cost of brand name medication. This patient may also prefer to transfer to [facility] which currently has pricing of $33.75 for 30 day supply of sacubitril -valsartan .  Left message for patient.  Rachel DEL, CPhT Rx Patient Advocate Phone: [phone]

## 2023-11-24 ENCOUNTER — Ambulatory Visit (HOSPITAL_COMMUNITY)
Admission: RE | Admit: 2023-11-24 | Discharge: 2023-11-24 | Disposition: A | Discharge status: Home / Self Care | Source: Ambulatory Visit | Attending: Cardiology | Admitting: Cardiology

## 2023-11-24 ENCOUNTER — Ambulatory Visit (HOSPITAL_COMMUNITY): Payer: Self-pay | Admitting: Cardiology

## 2023-11-24 DIAGNOSIS — I5022 Chronic systolic (congestive) heart failure: Secondary | ICD-10-CM | POA: Diagnosis not present

## 2023-11-24 LAB — BASIC METABOLIC PANEL WITH GFR
Anion gap: 13 (ref 5–15)
BUN: 11 mg/dL (ref 6–20)
CO2: 24 mmol/L (ref 22–32)
Calcium: 9.8 mg/dL (ref 8.9–10.3)
Chloride: 100 mmol/L (ref 98–111)
Creatinine, Ser: 0.82 mg/dL (ref 0.61–1.24)
GFR, Estimated: >60 mL/min (ref >60)
Glucose, Bld: 144 mg/dL — ABNORMAL HIGH (ref 70–99)
Potassium: 5.4 mmol/L — ABNORMAL HIGH (ref 3.5–5.1)
Sodium: 137 mmol/L (ref 135–145)

## 2023-11-24 LAB — CBC
HCT: 45 % (ref 39.0–52.0)
Hemoglobin: 15.9 g/dL (ref 13.0–17.0)
MCH: 34 pg (ref 26.0–34.0)
MCHC: 35.3 g/dL (ref 30.0–36.0)
MCV: 96.2 fL (ref 80.0–100.0)
Platelets: 173 K/uL (ref 150–400)
RBC: 4.68 MIL/uL (ref 4.22–5.81)
RDW: 11.5 % (ref 11.5–15.5)
WBC: 8.2 K/uL (ref 4.0–10.5)
nRBC: 0 % (ref 0.0–0.2)

## 2023-11-24 NOTE — Addendum Note (Signed)
 Addended by: MICAEL PORTO A on: 11/24/2023 02:23 PM   Modules accepted: Orders

## 2023-11-28 ENCOUNTER — Ambulatory Visit (HOSPITAL_COMMUNITY): Payer: Self-pay | Admitting: Cardiology

## 2023-11-28 ENCOUNTER — Ambulatory Visit (HOSPITAL_COMMUNITY)
Admission: RE | Admit: 2023-11-28 | Discharge: 2023-11-28 | Disposition: A | Discharge status: Home / Self Care | Source: Ambulatory Visit | Attending: Cardiology | Admitting: Cardiology

## 2023-11-28 DIAGNOSIS — I5022 Chronic systolic (congestive) heart failure: Secondary | ICD-10-CM | POA: Diagnosis not present

## 2023-11-28 LAB — BASIC METABOLIC PANEL WITH GFR
Anion gap: 11 (ref 5–15)
BUN: 18 mg/dL (ref 6–20)
CO2: 24 mmol/L (ref 22–32)
Calcium: 9.3 mg/dL (ref 8.9–10.3)
Chloride: 102 mmol/L (ref 98–111)
Creatinine, Ser: 0.84 mg/dL (ref 0.61–1.24)
GFR, Estimated: >60 mL/min (ref >60)
Glucose, Bld: 112 mg/dL — ABNORMAL HIGH (ref 70–99)
Potassium: 4.9 mmol/L (ref 3.5–5.1)
Sodium: 137 mmol/L (ref 135–145)

## 2023-11-30 ENCOUNTER — Encounter (HOSPITAL_COMMUNITY): Admission: RE | Payer: Self-pay | Source: Home / Self Care

## 2023-11-30 ENCOUNTER — Ambulatory Visit (HOSPITAL_COMMUNITY): Admission: RE | Admit: 2023-11-30 | Source: Home / Self Care | Admitting: Cardiology

## 2023-11-30 SURGERY — RIGHT/LEFT HEART CATH AND CORONARY ANGIOGRAPHY
Anesthesia: LOCAL

## 2023-12-15 ENCOUNTER — Encounter (HOSPITAL_COMMUNITY)

## 2023-12-18 DIAGNOSIS — H52223 Regular astigmatism, bilateral: Secondary | ICD-10-CM | POA: Diagnosis not present

## 2023-12-18 DIAGNOSIS — E119 Type 2 diabetes mellitus without complications: Secondary | ICD-10-CM | POA: Diagnosis not present

## 2023-12-18 LAB — HM DIABETES EYE EXAM

## 2023-12-27 ENCOUNTER — Other Ambulatory Visit (HOSPITAL_COMMUNITY): Payer: Self-pay

## 2023-12-27 ENCOUNTER — Encounter (HOSPITAL_COMMUNITY)
Admission: RE | Disposition: A | Discharge status: Home / Self Care | Payer: Self-pay | Source: Home / Self Care | Attending: Cardiology

## 2023-12-27 ENCOUNTER — Other Ambulatory Visit: Payer: Self-pay

## 2023-12-27 ENCOUNTER — Ambulatory Visit (HOSPITAL_COMMUNITY)
Admission: RE | Admit: 2023-12-27 | Discharge: 2023-12-27 | Disposition: A | Discharge status: Home / Self Care | Attending: Cardiology | Admitting: Cardiology

## 2023-12-27 DIAGNOSIS — I110 Hypertensive heart disease with heart failure: Secondary | ICD-10-CM | POA: Diagnosis not present

## 2023-12-27 DIAGNOSIS — Z79899 Other long term (current) drug therapy: Secondary | ICD-10-CM | POA: Diagnosis not present

## 2023-12-27 DIAGNOSIS — I5022 Chronic systolic (congestive) heart failure: Secondary | ICD-10-CM

## 2023-12-27 DIAGNOSIS — I429 Cardiomyopathy, unspecified: Secondary | ICD-10-CM | POA: Insufficient documentation

## 2023-12-27 DIAGNOSIS — Z7901 Long term (current) use of anticoagulants: Secondary | ICD-10-CM | POA: Insufficient documentation

## 2023-12-27 DIAGNOSIS — Z8249 Family history of ischemic heart disease and other diseases of the circulatory system: Secondary | ICD-10-CM | POA: Insufficient documentation

## 2023-12-27 DIAGNOSIS — I5020 Unspecified systolic (congestive) heart failure: Secondary | ICD-10-CM | POA: Insufficient documentation

## 2023-12-27 DIAGNOSIS — F17220 Nicotine dependence, chewing tobacco, uncomplicated: Secondary | ICD-10-CM | POA: Diagnosis not present

## 2023-12-27 DIAGNOSIS — I2510 Atherosclerotic heart disease of native coronary artery without angina pectoris: Secondary | ICD-10-CM | POA: Diagnosis not present

## 2023-12-27 DIAGNOSIS — Z7902 Long term (current) use of antithrombotics/antiplatelets: Secondary | ICD-10-CM | POA: Insufficient documentation

## 2023-12-27 DIAGNOSIS — Z955 Presence of coronary angioplasty implant and graft: Secondary | ICD-10-CM | POA: Insufficient documentation

## 2023-12-27 DIAGNOSIS — E785 Hyperlipidemia, unspecified: Secondary | ICD-10-CM

## 2023-12-27 DIAGNOSIS — I50810 Right heart failure, unspecified: Secondary | ICD-10-CM

## 2023-12-27 DIAGNOSIS — E781 Pure hyperglyceridemia: Secondary | ICD-10-CM

## 2023-12-27 HISTORY — PX: RIGHT/LEFT HEART CATH AND CORONARY ANGIOGRAPHY: CATH118266

## 2023-12-27 HISTORY — PX: CORONARY STENT INTERVENTION: CATH118234

## 2023-12-27 LAB — POCT I-STAT EG7
Acid-base deficit: 1 mmol/L (ref 0.0–2.0)
Acid-base deficit: 5 mmol/L — ABNORMAL HIGH (ref 0.0–2.0)
Bicarbonate: 19.2 mmol/L — ABNORMAL LOW (ref 20.0–28.0)
Bicarbonate: 23.9 mmol/L (ref 20.0–28.0)
Calcium, Ion: 1.15 mmol/L (ref 1.15–1.40)
Calcium, Ion: 1.25 mmol/L (ref 1.15–1.40)
HCT: 42 % (ref 39.0–52.0)
HCT: 45 % (ref 39.0–52.0)
Hemoglobin: 14.3 g/dL (ref 13.0–17.0)
Hemoglobin: 15.3 g/dL (ref 13.0–17.0)
O2 Saturation: 66 %
O2 Saturation: 68 %
Potassium: 3.6 mmol/L (ref 3.5–5.1)
Potassium: 4.2 mmol/L (ref 3.5–5.1)
Sodium: 137 mmol/L (ref 135–145)
Sodium: 141 mmol/L (ref 135–145)
TCO2: 20 mmol/L — ABNORMAL LOW (ref 22–32)
TCO2: 25 mmol/L (ref 22–32)
pCO2, Ven: 33.3 mmHg — ABNORMAL LOW (ref 44–60)
pCO2, Ven: 40.3 mmHg — ABNORMAL LOW (ref 44–60)
pH, Ven: 7.368 (ref 7.25–7.43)
pH, Ven: 7.38 (ref 7.25–7.43)
pO2, Ven: 35 mmHg (ref 32–45)
pO2, Ven: 36 mmHg (ref 32–45)

## 2023-12-27 LAB — CBC
HCT: 47.4 % (ref 39.0–52.0)
Hemoglobin: 16.4 g/dL (ref 13.0–17.0)
MCH: 34 pg (ref 26.0–34.0)
MCHC: 34.6 g/dL (ref 30.0–36.0)
MCV: 98.1 fL (ref 80.0–100.0)
Platelets: 171 K/uL (ref 150–400)
RBC: 4.83 MIL/uL (ref 4.22–5.81)
RDW: 11.8 % (ref 11.5–15.5)
WBC: 6 K/uL (ref 4.0–10.5)
nRBC: 0 % (ref 0.0–0.2)

## 2023-12-27 LAB — GLUCOSE, CAPILLARY: Glucose-Capillary: 171 mg/dL — ABNORMAL HIGH (ref 70–99)

## 2023-12-27 LAB — POCT ACTIVATED CLOTTING TIME: Activated Clotting Time: 406 s

## 2023-12-27 SURGERY — RIGHT/LEFT HEART CATH AND CORONARY ANGIOGRAPHY
Anesthesia: LOCAL

## 2023-12-27 MED ORDER — ASPIRIN 81 MG PO CHEW: 81.0000 mg | CHEWABLE_TABLET | Freq: Every day | ORAL | Status: DC

## 2023-12-27 MED ORDER — HYDRALAZINE HCL 20 MG/ML IJ SOLN: 10.0000 mg | INTRAMUSCULAR | Status: DC | PRN

## 2023-12-27 MED ORDER — FENTANYL CITRATE (PF) 100 MCG/2ML IJ SOLN
INTRAMUSCULAR | Status: DC | PRN
  Administered 2023-12-27 (×2): 25 ug via INTRAVENOUS

## 2023-12-27 MED ORDER — LIDOCAINE HCL (PF) 1 % IJ SOLN
INTRAMUSCULAR | Status: AC
  Filled 2023-12-27: qty 30

## 2023-12-27 MED ORDER — SODIUM CHLORIDE 0.9 % IV SOLN
INTRAVENOUS | Status: DC | PRN
Start: 2023-12-27 — End: 2023-12-27
  Administered 2023-12-27: 1000 mL via INTRAVENOUS

## 2023-12-27 MED ORDER — MIDAZOLAM HCL 2 MG/2ML IJ SOLN
INTRAMUSCULAR | Status: AC
  Filled 2023-12-27: qty 2

## 2023-12-27 MED ORDER — ACETAMINOPHEN 325 MG PO TABS: 650.0000 mg | ORAL_TABLET | ORAL | Status: DC | PRN

## 2023-12-27 MED ORDER — VERAPAMIL HCL 2.5 MG/ML IV SOLN
INTRAVENOUS | Status: AC
  Filled 2023-12-27: qty 2

## 2023-12-27 MED ORDER — VERAPAMIL HCL 2.5 MG/ML IV SOLN
INTRAVENOUS | Status: DC | PRN
  Administered 2023-12-27: 10 mL via INTRA_ARTERIAL

## 2023-12-27 MED ORDER — IOHEXOL 350 MG/ML SOLN
INTRAVENOUS | Status: DC | PRN
  Administered 2023-12-27: 120 mL

## 2023-12-27 MED ORDER — LIDOCAINE HCL (PF) 1 % IJ SOLN
INTRAMUSCULAR | Status: DC | PRN
  Administered 2023-12-27: 10 mL via INTRADERMAL

## 2023-12-27 MED ORDER — CLOPIDOGREL BISULFATE 75 MG PO TABS: 75.0000 mg | ORAL_TABLET | Freq: Every day | ORAL | 2 refills | Status: DC

## 2023-12-27 MED ORDER — SODIUM CHLORIDE 0.9% FLUSH: 3.0000 mL | INTRAVENOUS | Status: DC | PRN

## 2023-12-27 MED ORDER — FENTANYL CITRATE (PF) 100 MCG/2ML IJ SOLN
INTRAMUSCULAR | Status: AC
  Filled 2023-12-27: qty 2

## 2023-12-27 MED ORDER — SODIUM CHLORIDE 0.9 % IV SOLN
INTRAVENOUS | Status: DC | PRN
  Administered 2023-12-27: 1.75 mg/kg/h via INTRAVENOUS

## 2023-12-27 MED ORDER — FREE WATER: 250.0000 mL | Freq: Once | Status: DC

## 2023-12-27 MED ORDER — CLOPIDOGREL BISULFATE 75 MG PO TABS
75.0000 mg | ORAL_TABLET | Freq: Every day | ORAL | 2 refills | Status: AC
  Filled 2023-12-27: qty 90, 90d supply, fill #0

## 2023-12-27 MED ORDER — ASPIRIN 81 MG PO CHEW
81.0000 mg | CHEWABLE_TABLET | Freq: Every day | ORAL | 0 refills | Status: AC
  Filled 2023-12-27: qty 7, 7d supply, fill #0

## 2023-12-27 MED ORDER — NITROGLYCERIN 0.4 MG SL SUBL
0.4000 mg | SUBLINGUAL_TABLET | SUBLINGUAL | 3 refills | Status: AC | PRN
  Filled 2023-12-27: qty 25, 8d supply, fill #0

## 2023-12-27 MED ORDER — NITROGLYCERIN 1 MG/10 ML FOR IR/CATH LAB
INTRA_ARTERIAL | Status: AC
  Filled 2023-12-27: qty 10

## 2023-12-27 MED ORDER — CLOPIDOGREL BISULFATE 300 MG PO TABS
ORAL_TABLET | ORAL | Status: DC | PRN
  Administered 2023-12-27: 600 mg via ORAL

## 2023-12-27 MED ORDER — ONDANSETRON HCL 4 MG/2ML IJ SOLN: 4.0000 mg | Freq: Four times a day (QID) | INTRAMUSCULAR | Status: DC | PRN

## 2023-12-27 MED ORDER — CLOPIDOGREL BISULFATE 300 MG PO TABS
ORAL_TABLET | ORAL | Status: AC
  Filled 2023-12-27: qty 1

## 2023-12-27 MED ORDER — MIDAZOLAM HCL 2 MG/2ML IJ SOLN
INTRAMUSCULAR | Status: DC | PRN
  Administered 2023-12-27: .5 mg via INTRAVENOUS
  Administered 2023-12-27 (×2): 1 mg via INTRAVENOUS

## 2023-12-27 MED ORDER — BIVALIRUDIN BOLUS VIA INFUSION - CUPID
INTRAVENOUS | Status: DC | PRN
  Administered 2023-12-27: 59.175 mg via INTRAVENOUS

## 2023-12-27 MED ORDER — CLOPIDOGREL BISULFATE 75 MG PO TABS: 75.0000 mg | ORAL_TABLET | Freq: Every day | ORAL | Status: DC

## 2023-12-27 MED ORDER — ASPIRIN 81 MG PO CHEW: 81.0000 mg | CHEWABLE_TABLET | Freq: Every day | ORAL | 0 refills | Status: DC

## 2023-12-27 MED ORDER — APIXABAN 2.5 MG PO TABS
2.5000 mg | ORAL_TABLET | Freq: Two times a day (BID) | ORAL | 3 refills | Status: AC
Start: 2023-12-27 — End: ?

## 2023-12-27 MED ORDER — SODIUM CHLORIDE 0.9% FLUSH: 3.0000 mL | Freq: Two times a day (BID) | INTRAVENOUS | Status: DC

## 2023-12-27 MED ORDER — BIVALIRUDIN TRIFLUOROACETATE 250 MG IV SOLR
INTRAVENOUS | Status: AC
  Filled 2023-12-27: qty 250

## 2023-12-27 MED ORDER — SODIUM CHLORIDE 0.9 % IV SOLN: 250.0000 mL | INTRAVENOUS | Status: DC | PRN

## 2023-12-27 MED ORDER — LABETALOL HCL 5 MG/ML IV SOLN: 10.0000 mg | INTRAVENOUS | Status: DC | PRN

## 2023-12-27 MED ORDER — ASPIRIN 81 MG PO CHEW
81.0000 mg | CHEWABLE_TABLET | ORAL | Status: AC
  Administered 2023-12-27: 81 mg via ORAL
  Filled 2023-12-27: qty 1

## 2023-12-27 MED ORDER — FREE WATER: 500.0000 mL | Freq: Once | Status: DC

## 2023-12-27 MED ORDER — NITROGLYCERIN 1 MG/10 ML FOR IR/CATH LAB
INTRA_ARTERIAL | Status: DC | PRN
  Administered 2023-12-27: 200 ug via INTRACORONARY

## 2023-12-27 SURGICAL SUPPLY — 16 items
BALLOON EMERGE MR 3.5X12 (BALLOONS) IMPLANT
BALLOON SAPPHIRE NC24 4.0X12 (BALLOONS) IMPLANT
CATH 5FR JL3.5 JR4 ANG PIG MP (CATHETERS) IMPLANT
CATH BALLN WEDGE 5F 110CM (CATHETERS) IMPLANT
CATH LAUNCHER 6FR EBU3.5 (CATHETERS) IMPLANT
DEVICE RAD COMP TR BAND LRG (VASCULAR PRODUCTS) IMPLANT
GLIDESHEATH SLEND SS 6F .021 (SHEATH) IMPLANT
GUIDEWIRE INQWIRE 1.5J.035X260 (WIRE) IMPLANT
KIT ENCORE 26 ADVANTAGE (KITS) IMPLANT
KIT HEMO VALVE WATCHDOG (MISCELLANEOUS) IMPLANT
PACK CARDIAC CATHETERIZATION (CUSTOM PROCEDURE TRAY) ×1 IMPLANT
SHEATH GLIDE SLENDER 4/5FR (SHEATH) IMPLANT
SHEATH PINNACLE 6F 10CM (SHEATH) IMPLANT
STENT SYNERGY XD 3.50X16 (Permanent Stent) IMPLANT
WIRE ASAHI PROWATER 180CM (WIRE) IMPLANT
WIRE RUNTHROUGH .014X180CM (WIRE) IMPLANT

## 2023-12-27 NOTE — Discharge Instructions (Signed)

## 2023-12-27 NOTE — H&P (Signed)
 Advanced Heart Failure Team History and Physical Note   PCP:  Mahlon Comer BRAVO, MD  PCP-Cardiology: None     Reason for Admission: Cardiomyopathy   HPI:    Fall in EF with calcium  score in 99th percentile, worry for significant CAD.    Home Medications Prior to Admission medications   Medication Sig Start Date End Date Taking? Authorizing Provider  Acetylcysteine (NAC PO) Take 1 tablet by mouth in the morning.   Yes [provider]  apixaban  (ELIQUIS ) 2.5 MG TABS tablet Take 1 tablet (2.5 mg total) by mouth 2 (two) times daily. 07/21/23  Yes Rolan Ezra RAMAN, MD  carvedilol  (COREG ) 12.5 MG tablet TAKE 1 TABLET BY MOUTH TWICE A DAY WITH MEAL CALL OFFICE FOR YEARLY FOLLOW UP APPT 08/14/23  Yes Rolan Ezra RAMAN, MD  Cholecalciferol (VITAMIN D-3 PO) Take 1 tablet by mouth in the morning.   Yes [provider]  Cyanocobalamin (VITAMIN B-12 PO) Take 1 tablet by mouth in the morning.   Yes [provider]  EPINEPHrine  0.3 mg/0.3 mL IJ SOAJ injection USE AS DIRECTED ON PACKAGE AS NEEDED FOR ALLERGIC REACTION 08/11/23  Yes Tabori, Katherine E, MD  JARDIANCE  10 MG TABS tablet TAKE 1 TABLET BY MOUTH EVERY DAY 08/02/23  Yes Tabori, Katherine E, MD  Omega-3 Fatty Acids (FISH OIL PO) Take 1 capsule by mouth in the morning and at bedtime.   Yes [provider]  rosuvastatin  (CRESTOR ) 5 MG tablet TAKE 1 TABLET (5 MG TOTAL) BY MOUTH DAILY. Patient taking differently: Take 5 mg by mouth every evening. 11/08/23  Yes Mahlon Comer BRAVO, MD  sacubitril -valsartan  (ENTRESTO ) 49-51 MG Take 1 tablet by mouth 2 (two) times daily. 11/13/23  Yes Rolan Ezra RAMAN, MD  spironolactone  (ALDACTONE ) 25 MG tablet TAKE 1 TABLET (25 MG TOTAL) BY MOUTH DAILY. 08/11/23  Yes Tabori, Katherine E, MD  Blood Glucose Monitoring Suppl (CONTOUR NEXT MONITOR) w/Device KIT 1 Units by Does not apply route daily. Check blood sugars once daily Dx:E11.9 09/04/17   Mahlon Comer BRAVO, MD    Past  Medical History: Past Medical History:  Diagnosis Date   CHF (congestive heart failure) (HCC)    Diabetes mellitus without complication (HCC)    Hyperlipidemia    Hypertension     Past Surgical History: Past Surgical History:  Procedure Laterality Date   NO PAST SURGERIES      Family History:  Family History  Problem Relation Age of Onset   Heart failure Mother    Heart failure Brother    Deep vein thrombosis Father    Esophageal cancer Neg Hx    Colon cancer Neg Hx    Stomach cancer Neg Hx    Pancreatic cancer Neg Hx     Social History: Social History   Socioeconomic History   Marital status: Married    Spouse name: Not on file   Number of children: Not on file   Years of education: Not on file   Highest education level: Not on file  Occupational History   Not on file  Tobacco Use   Smoking status: Former    Current packs/day: 0.00    Types: Cigarettes    Quit date: 02/25/2014    Years since quitting: 9.8   Smokeless tobacco: Current    Types: Snuff  Vaping Use   Vaping status: Never Used  Substance and Sexual Activity   Alcohol use: Yes    Comment: occ   Drug use: No  Sexual activity: Not on file  Other Topics Concern   Not on file  Social History Narrative   Not on file   Social Drivers of Health   Financial Resource Strain: Low Risk  (03/03/2023)   Overall Financial Resource Strain (CARDIA)    Difficulty of Paying Living Expenses: Not hard at all  Food Insecurity: No Food Insecurity (03/03/2023)   Hunger Vital Sign    Worried About Running Out of Food in the Last Year: Never true    Ran Out of Food in the Last Year: Never true  Transportation Needs: No Transportation Needs (03/03/2023)   PRAPARE - Administrator, Civil Service (Medical): No    Lack of Transportation (Non-Medical): No  Physical Activity: Inactive (03/03/2023)   Exercise Vital Sign    Days of Exercise per Week: 0 days    Minutes of Exercise per Session: 0 min   Stress: No Stress Concern Present (03/03/2023)   [employer] of Occupational Health - Occupational Stress Questionnaire    Feeling of Stress : Not at all  Social Connections: Moderately Isolated (03/03/2023)   Social Connection and Isolation Panel    Frequency of Communication with Friends and Family: Three times a week    Frequency of Social Gatherings with Friends and Family: Once a week    Attends Religious Services: Never    Database administrator or Organizations: No    Attends Banker Meetings: Never    Marital Status: Married    Allergies:  Allergies  Allergen Reactions   Alpha-Gal Hives, Itching and Nausea And Vomiting   Beef-Derived Drug Products Hives, Itching and Nausea And Vomiting   Pork-Derived Products Hives, Itching and Nausea And Vomiting    Objective:    Vital Signs:   Temp:  [98.3 F (36.8 C)] 98.3 F (36.8 C) (10/01 0707) Pulse Rate:  [82] 82 (10/01 0707) Resp:  [17] 17 (10/01 0707) BP: (151)/(87) 151/87 (10/01 0707) SpO2:  [95 %] 95 % (10/01 0707) Weight:  [78.9 kg] 78.9 kg (10/01 0707)   Filed Weights   12/27/23 0707  Weight: 78.9 kg     Physical Exam     General:  Well appearing. No respiratory difficulty HEENT: Normal Neck: Supple. no JVD. Carotids 2+ bilat; no bruits. No lymphadenopathy or thyromegaly appreciated. Cor: PMI nondisplaced. Regular rate & rhythm. No rubs, gallops or murmurs. Lungs: Clear Abdomen: Soft, nontender, nondistended. No hepatosplenomegaly. No bruits or masses. Good bowel sounds. Extremities: No cyanosis, clubbing, rash, edema Neuro: Alert & oriented x 3, cranial nerves grossly intact. moves all 4 extremities w/o difficulty. Affect pleasant.    Labs     Basic Metabolic Panel: No results for input(s): NA, K, CL, CO2, GLUCOSE, BUN, CREATININE, CALCIUM , MG, PHOS in the last 168 hours.  Liver Function Tests: No results for input(s): AST, ALT, ALKPHOS, BILITOT, PROT,  ALBUMIN in the last 168 hours. No results for input(s): LIPASE, AMYLASE in the last 168 hours. No results for input(s): AMMONIA in the last 168 hours.  CBC: Recent Labs  Lab 12/27/23 0632  WBC 6.0  HGB 16.4  HCT 47.4  MCV 98.1  PLT 171    Cardiac Enzymes: No results for input(s): CKTOTAL, CKMB, CKMBINDEX, TROPONINI in the last 168 hours.  BNP: BNP (last 3 results) No results for input(s): BNP in the last 8760 hours.  ProBNP (last 3 results) No results for input(s): PROBNP in the last 8760 hours.   CBG: Recent Labs  Lab 12/27/23  0706  GLUCAP 171*    Coagulation Studies: No results for input(s): LABPROT, INR in the last 72 hours.  Imaging: No results found.    Assessment/Plan   RHC/LHC today to investigate cardiomyopathy.    Ezra Shuck, MD 12/27/2023, 8:43 AM Total Time Spent  Advanced Heart Failure Team Pager [phone] (M-F; 7a - 5p)  Please contact [facility] for night-coverage after hours (4p -7a ) and weekends on [email or web address]

## 2023-12-27 NOTE — Discharge Summary (Signed)
 Discharge Summary for Same Day PCI   Patient ID: Matthew Green MRN: [id number]; DOB: 12-21-1965  Admit date: 12/27/2023 Discharge date: 12/27/2023  Primary Care Provider: Mahlon Comer BRAVO, MD  Primary Cardiologist: Dr. Rolan Primary Electrophysiologist:  None   Discharge Diagnoses    Active Problems:   CAD (coronary artery disease)  Diagnostic Studies/Procedures    Cardiac Catheterization 12/27/2023:    Dist RCA lesion is 80% stenosed.   Prox LAD to Mid LAD lesion is 95% stenosed.   1st Diag lesion is 70% stenosed.   A drug-eluting stent was successfully placed using a STENT SYNERGY XD 3.50X16.   Post intervention, there is a 0% residual stenosis.   Recommend aspirin for 1 week, then stop. Recommend Plavix 75 mg daily. Resume Eliquis  2.5 mg twice daily this evening. Manish JINNY Lawrence, MD   1. Discrete 95% proximal LAD stenosis just after D1. 80% distal RCA stenosis.  2. Normal filling pressures on RHC.    Patient has newly decreased LV systolic function, looks like LAD territory hypokinesis.  We discussed CABG versus PCI.  He is not a diabetic. With discrete severe proximal LAD stenosis, we elected for PCI to LAD.  Dr. Lawrence will do the procedure now.  He is on Eliquis  2.5 mg bid at home, will plan Plavix + Eliquis  2.5 mg bid with ASA 81 x 1 week.    Ezra Rolan, MD -------------------------------------------------------------------------------   1. Successful percutaneous coronary intervention prox LAD        PTCA and stent placement 3.5 X 16 mm Synergy drug-eluting stent, deployed at 16 atm        Post dilatation using 4.0 XS 12 mm [place] balloon up to 18 atm        Jailed diagonal branch with Mynx ostium, but TIMI-3 flow and no dissection.  Therefore, no further intervention deemed necessary.      Newman JINNY Lawrence, MD   _____________   History of Present Illness     Matthew Green is a 58 y.o. male with a history of tobacco use and heavy ETOH use  developed exertional dyspnea and orthopnea about 3 weeks prior to admission to [facility] in 12/15.  No chest pain.  He was admitted 03/21/14 and found to have a PE.  Lower extremity US  showed no DVT.  He had no known trigger: no surgery or prolonged immobility.  His father had a DVT after a leg injury.  As part of the workup, he had an echo showing EF 20-25% with diffuse hypokinesis.  He was noted to be volume overloaded and was diuresed.  He was started on apixaban .  His brother and multiple family members on his mother's side have a history of cardiomyopathy.  Of note, he has been a heavy drinker, up to 12 beers/day, since his divorce.  He was also a heavy smoker until around the time of his CHF admission, when he quit.  Echo was repeated in 4/16, showing EF 50-55% with trace MR.     Echo in 6/20 showed EF 55%, normal RV.    Coronary calcium  score was done in 5/25, score was 1366 Agatston units (94th percentile).  Echo in 6/25 showed EF down to 40-45% with moderate LV dilation and normal RV.     Last seen in the clinic on 8/15 by Dr. Rolan. Weight is down 12 lbs.  With markedly abnormal calcium  score and low EF on echo, he changed his diet and worked on increased exercise.  He was not drinking ETOH.  He was not taking Crestor  due to myalgias but restarted 5 mg daily in 5/25. He did not feel that he could increase the dose any further due to myalgias.  No chest pain.  He reported walking 3-4 miles/day with his dog, no exertional dyspnea.  No palpitations/lightheadedness.     Given the decline in his EF and elevated coronary Ca+ score,he was set up for outpatient cardiac catheterization.   Hospital Course     The patient underwent cardiac cath as noted above with 95% pLAD stenosis just after D1, along with 80% dRCA. Options were discussed with patient and decision made to proceed with PCI/DES x1 of the pLAD with jailed diagonal branch but TIMI-3 flow noted. Plan for ASA/plavix/eliquis  for one week, the  stop ASA, continue plavix/eliquis . The patient was seen by cardiac rehab while in short stay. There were no observed complications post cath. Radial cath site was re-evaluated prior to discharge and found to be stable without any complications. Instructions/precautions regarding cath site care were given prior to discharge.  Matthew Green was seen by Dr. Elmira and determined stable for discharge home. Follow up with our office has been arranged. Medications are listed below. Pertinent changes include addition of ASA, plavix, PRN SL NTG. Referral placed to lipid clinic given myalgias with higher doses of statin in the past.    _____________  Cath/PCI Registry Performance & Quality Measures: Aspirin prescribed? - Yes ADP Receptor Inhibitor (Plavix/Clopidogrel, Brilinta/Ticagrelor or Effient/Prasugrel) prescribed (includes medically managed patients)? - Yes High Intensity Statin (Lipitor 40-80mg  or Crestor  20-40mg ) prescribed? - No - intolerant, referral to lipid clinic For EF <40%, was ACEI/ARB prescribed? - Yes For EF <40%, Aldosterone Antagonist (Spironolactone  or Eplerenone) prescribed? - Yes Cardiac Rehab Phase II ordered (Included Medically managed Patients)? - Yes  _____________   Discharge Vitals Blood pressure 135/85, pulse 87, temperature 98.3 F (36.8 C), temperature source Oral, resp. rate 14, height 5' 9 (1.753 m), weight 78.9 kg, SpO2 95%.  Filed Weights   12/27/23 0707  Weight: 78.9 kg    Last Labs & Radiologic Studies    CBC Recent Labs    12/27/23 0632  WBC 6.0  HGB 16.4  HCT 47.4  MCV 98.1  PLT 171   Basic Metabolic Panel No results for input(s): NA, K, CL, CO2, GLUCOSE, BUN, CREATININE, CALCIUM , MG, PHOS in the last 72 hours. Liver Function Tests No results for input(s): AST, ALT, ALKPHOS, BILITOT, PROT, ALBUMIN in the last 72 hours. No results for input(s): LIPASE, AMYLASE in the last 72 hours. High Sensitivity  Troponin:   No results for input(s): TROPONINIHS in the last 720 hours.  BNP Invalid input(s): POCBNP D-Dimer No results for input(s): DDIMER in the last 72 hours. Hemoglobin A1C No results for input(s): HGBA1C in the last 72 hours. Fasting Lipid Panel No results for input(s): CHOL, HDL, LDLCALC, TRIG, CHOLHDL, LDLDIRECT in the last 72 hours. Thyroid  Function Tests No results for input(s): TSH, T4TOTAL, T3FREE, THYROIDAB in the last 72 hours.  Invalid input(s): FREET3 _____________  CARDIAC CATHETERIZATION Result Date: 12/27/2023 Images from the original result were not included.   Dist RCA lesion is 80% stenosed.   Prox LAD to Mid LAD lesion is 95% stenosed.   1st Diag lesion is 70% stenosed.   A drug-eluting stent was successfully placed using a STENT SYNERGY XD 3.50X16.   Post intervention, there is a 0% residual stenosis.   Recommend aspirin for 1 week, then stop. Recommend  Plavix 75 mg daily. Resume Eliquis  2.5 mg twice daily this evening. Manish JINNY Lawrence, MD 1. Discrete 95% proximal LAD stenosis just after D1. 80% distal RCA stenosis. 2. Normal filling pressures on RHC. Patient has newly decreased LV systolic function, looks like LAD territory hypokinesis.  We discussed CABG versus PCI.  He is not a diabetic. With discrete severe proximal LAD stenosis, we elected for PCI to LAD.  Dr. Lawrence will do the procedure now.  He is on Eliquis  2.5 mg bid at home, will plan Plavix + Eliquis  2.5 mg bid with ASA 81 x 1 week. Ezra Shuck, MD --------------------------------------------------------------------------- ---- 1. Successful percutaneous coronary intervention prox LAD        PTCA and stent placement 3.5 X 16 mm Synergy drug-eluting stent, deployed at 16 atm        Post dilatation using 4.0 XS 12 mm [place] balloon up to 18 atm        Jailed diagonal branch with Mynx ostium, but TIMI-3 flow and no dissection.  Therefore, no further intervention deemed necessary.  Manish JINNY Lawrence, MD   Disposition   Pt is being discharged home today in good condition.  Follow-up Plans & Appointments     Discharge Instructions     AMB Referral to [facility]   Complete by: As directed    Reason For Referral: Lipids   Amb Referral to Cardiac Rehabilitation   Complete by: As directed    Diagnosis: Coronary Stents   After initial evaluation and assessments completed: Virtual Based Care may be provided alone or in conjunction with Phase 2 Cardiac Rehab based on patient barriers.: Yes   Intensive Cardiac Rehabilitation (ICR) [facility] location only OR Traditional Cardiac Rehabilitation (TCR) *If criteria for ICR are not met will enroll in TCR [facility] only): Yes        Discharge Medications   Allergies as of 12/27/2023       Reactions   Alpha-gal Hives, Itching, Nausea And Vomiting   Beef-derived Drug Products Hives, Itching, Nausea And Vomiting   Pork-derived Products Hives, Itching, Nausea And Vomiting   Rosuvastatin     Myalgias (tolerates low doses)        Medication List     TAKE these medications    apixaban  2.5 MG Tabs tablet Commonly known as: Eliquis  Take 1 tablet (2.5 mg total) by mouth 2 (two) times daily. Resume on 12/27/2023 evening What changed: additional instructions   Aspirin Low Dose 81 MG chewable tablet Generic drug: aspirin Chew 1 tablet (81 mg total) by mouth daily for 7 days. Start taking on: December 28, 2023   carvedilol  12.5 MG tablet Commonly known as: COREG  TAKE 1 TABLET BY MOUTH TWICE A DAY WITH MEAL CALL OFFICE FOR YEARLY FOLLOW UP APPT   clopidogrel 75 MG tablet Commonly known as: PLAVIX Take 1 tablet (75 mg total) by mouth daily with breakfast. Start taking on: December 28, 2023   Contour Next Monitor w/Device Kit 1 Units by Does not apply route daily. Check blood sugars once daily Dx:E11.9   EPINEPHrine  0.3 mg/0.3 mL Soaj injection Commonly known as: EPI-PEN USE AS DIRECTED ON PACKAGE AS NEEDED FOR  ALLERGIC REACTION   FISH OIL PO Take 1 capsule by mouth in the morning and at bedtime.   Jardiance  10 MG Tabs tablet Generic drug: empagliflozin  TAKE 1 TABLET BY MOUTH EVERY DAY   NAC PO Take 1 tablet by mouth in the morning.   nitroGLYCERIN 0.4 MG SL tablet Commonly known as:  Nitrostat Place 1 tablet (0.4 mg total) under the tongue every 5 (five) minutes as needed.   rosuvastatin  5 MG tablet Commonly known as: CRESTOR  TAKE 1 TABLET (5 MG TOTAL) BY MOUTH DAILY. What changed: when to take this   sacubitril -valsartan  49-51 MG Commonly known as: Entresto  Take 1 tablet by mouth 2 (two) times daily.   spironolactone  25 MG tablet Commonly known as: ALDACTONE  TAKE 1 TABLET (25 MG TOTAL) BY MOUTH DAILY.   VITAMIN B-12 PO Take 1 tablet by mouth in the morning.   VITAMIN D-3 PO Take 1 tablet by mouth in the morning.           Allergies Allergies  Allergen Reactions   Alpha-Gal Hives, Itching and Nausea And Vomiting   Beef-Derived Drug Products Hives, Itching and Nausea And Vomiting   Pork-Derived Products Hives, Itching and Nausea And Vomiting   Rosuvastatin      Myalgias (tolerates low doses)    Outstanding Labs/Studies   N/a   Duration of Discharge Encounter   Greater than 30 minutes including physician time.  Signed, Manuelita Rummer, NP 12/27/2023, 3:26 PM

## 2023-12-27 NOTE — Progress Notes (Signed)
 Pt was educated on stent card, stent location, Antiplatelet and ASA use, wt restrictions, no baths/daily wash-ups, s/s of infection, ex guidelines, s/s to stop exercising, NTG use and calling 911, heart healthy diet, risk factors and CRPII. Pt received materials on exercise, diet, and CRPII. Will refer to [facility].   Pt is not interested in CR as he completed program years ago.   Garen FORBES Candy MS, ACSM-CEP  12/27/2023 2:07 PM

## 2023-12-28 ENCOUNTER — Encounter (HOSPITAL_COMMUNITY): Payer: Self-pay | Admitting: Cardiology

## 2023-12-29 ENCOUNTER — Telehealth (HOSPITAL_COMMUNITY): Payer: Self-pay

## 2023-12-29 NOTE — Telephone Encounter (Signed)
 Patient returned call regarding cardiac rehab, confirmed interest in program. Is aware he needs to attend his f/u on 10/24.

## 2023-12-29 NOTE — Telephone Encounter (Signed)
 Phase II referral. Attempted to call patient regarding interest in cardiac rehab- no answer, left message. Sent MyChart message.  F/u 10/24.

## 2024-01-17 NOTE — Progress Notes (Signed)
 Date:  01/17/2024   ID:  Matthew Green, DOB 26-Oct-1965, MRN [id number]   Provider location: [address] [place] Type of Visit: Established patient  PCP:  Mahlon Comer BRAVO, MD  Cardiologist: Dr. Rolan  Chief complaint: CHF   History of Present Illness: Matthew Green is a 58 y.o. male who has history of tobacco use and heavy ETOH use developed exertional dyspnea and orthopnea about 3 weeks prior to admission to [facility] in 12/15.  No chest pain.  He was admitted 03/21/14 and found to have a PE.  Lower extremity US  showed no DVT.  He had no known trigger: no surgery or prolonged immobility.  His father had a DVT after a leg injury.  As part of the workup, he had an echo showing EF 20-25% with diffuse hypokinesis.  He was noted to be volume overloaded and was diuresed.  He was started on apixaban .  His brother and multiple family members on his mother's side have a history of cardiomyopathy.  Of note, he has been a heavy drinker, up to 12 beers/day, since his divorce.  He was also a heavy smoker until around the time of his CHF admission, when he quit.  Echo was repeated in 4/16, showing EF 50-55% with trace MR.    Echo in 6/20 showed EF 55%, normal RV.   Coronary calcium  score was done in 5/25, score was 1366 Agatston units (94th percentile).  Echo in 6/25 showed EF down to 40-45% with moderate LV dilation and normal RV.     He returns for followup of cardiomyopathy. Weight is down 12 lbs.  With markedly abnormal calcium  score and low EF on echo, he changed his diet and worked on increased exercise.  He is not drinking ETOH.  He was not taking Crestor  due to myalgias but restarted 5 mg daily in 5/25. He does not think that he can increase the dose any further due to myalgias.  No chest pain.  He walks 3-4 miles/day with his dog, no exertional dyspnea.  No palpitations/lightheadedness.    ECG (personally reviewed): NSR, septal Qs   Labs  (03/25/14): K 4.6,  creatinine 1.29, HCT 43.5, BNP 987, TnI 0.05, AST/ALT normal, LDL 64, TSH normal, lupus anticoagulant negative, beta-2  glycoprotein negative, anticardiolipin antibody negative. Factor V Leiden homozygote.  Negative for prothrombin gene mutation.  (12/24): hgb 17.1, TSH normal, K 4.3, creatinine 0.92, TGs 276, LDL 93 (5/25): K 4.4, creatinine 0.82, LDL 95, TGs 251   PMH: 1. PE: Diagnosed by CTA in 12/15. Lower extremity US  was negative.  There was no trigger.  Lupus anticoagulant negative, beta-2  glycoprotein negative, anticardiolipin antibody negative, prothrombin gene mutation negative.  Factor V Leiden heterozygote.  2. Cardiomyopathy: Echo (12/15) with EF 20-25%, mildly dilated LV with diffuse hypokinesis, RV mildly dilated with moderately decreased systolic function, PA systolic pressure 44 mmHg. Cardiac MRI (1/16) with LV EF 34%, global hypokinesis; RV moderately hypokinetic, EF 30%, possible subtle mid-wall LGE in the anterior wall (not a coronary pattern).  Echo (4/16) with EF 50-55%, trace MR.  - Echo (12/17): EF 55-60%, normal RV.  - Echo (6/20): EF 55%, normal RV - Echo (6/25): EF down to 40-45% with moderate LV dilation and normal RV.   3. 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  4. Hyperlipidemia: Myalgias with atorvastatin .   5. CAD: Coronary calcium  score 1366 Agatston units, 99th percentile for age and  gender.   Current Outpatient Medications  Medication Sig Dispense Refill   Acetylcysteine (NAC PO) Take 1 tablet by mouth in the morning.     apixaban  (ELIQUIS ) 2.5 MG TABS tablet Take 1 tablet (2.5 mg total) by mouth 2 (two) times daily. Resume on 12/27/2023 evening 180 tablet 3   Blood Glucose Monitoring Suppl (CONTOUR NEXT MONITOR) w/Device KIT 1 Units by Does not apply route daily. Check blood sugars once daily Dx:E11.9 1 kit 0   carvedilol  (COREG ) 12.5 MG tablet TAKE 1 TABLET BY MOUTH TWICE A DAY WITH MEAL CALL OFFICE FOR  YEARLY FOLLOW UP APPT 180 tablet 1   Cholecalciferol (VITAMIN D-3 PO) Take 1 tablet by mouth in the morning.     clopidogrel (PLAVIX) 75 MG tablet Take 1 tablet (75 mg total) by mouth daily with breakfast. 90 tablet 2   Cyanocobalamin (VITAMIN B-12 PO) Take 1 tablet by mouth in the morning.     EPINEPHrine  0.3 mg/0.3 mL IJ SOAJ injection USE AS DIRECTED ON PACKAGE AS NEEDED FOR ALLERGIC REACTION 1 each 3   JARDIANCE  10 MG TABS tablet TAKE 1 TABLET BY MOUTH EVERY DAY 90 tablet 1   nitroGLYCERIN (NITROSTAT) 0.4 MG SL tablet Place 1 tablet (0.4 mg total) under the tongue every 5 (five) minutes as needed. 25 tablet 3   Omega-3 Fatty Acids (FISH OIL PO) Take 1 capsule by mouth in the morning and at bedtime.     rosuvastatin  (CRESTOR ) 5 MG tablet TAKE 1 TABLET (5 MG TOTAL) BY MOUTH DAILY. (Patient taking differently: Take 5 mg by mouth every evening.) 90 tablet 1   sacubitril -valsartan  (ENTRESTO ) 49-51 MG Take 1 tablet by mouth 2 (two) times daily. 60 tablet 3   spironolactone  (ALDACTONE ) 25 MG tablet TAKE 1 TABLET (25 MG TOTAL) BY MOUTH DAILY. 90 tablet 3   No current facility-administered medications for this visit.    Allergies:   Alpha-gal, Bovine (beef) protein-containing drug products, Porcine (pork) protein-containing drug products, and Rosuvastatin    Social History:  The patient  reports that he quit smoking about 9 years ago. His smoking use included cigarettes. His smokeless tobacco use includes snuff. He reports current alcohol use. He reports that he does not use drugs.   Family History:  The patient's family history includes Deep vein thrombosis in his father; Heart failure in his brother and mother.   ROS:  Please see the history of present illness.   All other systems are personally reviewed and negative.   Exam:   There were no vitals taken for this visit. General: NAD Neck: No JVD, no thyromegaly or thyroid  nodule.  Lungs: Clear to auscultation bilaterally with normal  respiratory effort. CV: Nondisplaced PMI.  Heart regular S1/S2, no S3/S4, no murmur.  No peripheral edema.  No carotid bruit.  Normal pedal pulses.  Abdomen: Soft, nontender, no hepatosplenomegaly, no distention.  Skin: Intact without lesions or rashes.  Neurologic: Alert and oriented x 3.  Psych: Normal affect. Extremities: No clubbing or cyanosis.  HEENT: Normal.   Wt Readings from Last 3 Encounters:  12/27/23 78.9 kg (174 lb)  11/10/23 79.2 kg (174 lb 9.6 oz)  11/10/23 80 kg (176 lb 6.4 oz)     ASSESSMENT AND PLAN:  1. PE: Patient developed a PE with no obvious trigger. His father had a DVT but it seems to have happened after a leg injury; brother had DVT. He is a Factor V Leiden heterozygote. - Given spontaneous DVT with Factor V Leiden heterozygosity,  would be reasonable to continue apixaban  2.5 mg bid long-term given no problems with bleeding.    2. Chronic systolic CHF => recovered: Suspect nonischemic cardiomyopathy.  Heavy ETOH use may have contributed to cardiomyopathy.  However, he does have a strong family history of CMP (brother, mother and members of mother's family).  This may be a familial cardiomyopathy. ECHO 12/15 EF 20-25%.  Cardiac MRI in 1/16 showed LV EF 34%, RV EF 30%, subtle mid-wall anterior LGE.  This did not appear to be a CAD pattern. Repeat echo in 4/16 showed EF improved considerably to 50-55%, suggesting that heavy ETOH may have played a large role with improvement after cutting back.  Echo in 6/20 with EF 55%.  Most recent echo in 6/25 showed EF back down again to 40-45%.  With very high calcium  score and septal Qs on ECG, I worry that CAD could be the cause for the new drop in EF.  However, symptoms are NYHA class I, not volume overloaded on exam.  - Continue Coreg  12.5 bid. - Continue spironolactone  25 daily.  - Continue Jardiance  10 daily.  - Stop lisinopril , after 36 hrs, will start Entresto  49/51 bid. BMET/BNP today and BMET in 10 days.  3. CAD: Calcium   score 1366 in 5/25, this places the patient in the 99th percentile for age and gender and suggests high risk for future cardiac events.  He is not symptomatic, but EF is lower at 40-45% and he has septal Qs on ECG.   - Given fall in EF and evidence for coronary disease by calcium  score, I am going to arrange for RHC/LHC . With the markedly high calcium  score, I do worry that he is at risk for 3 vessel disease that may benefit from CABG.  We discussed risks/benefits and he agrees to procedure. Hold Eliquis  the day before and day of procedure.  - He has restarted Crestor  but cannot take more than 5 mg daily without debilitating myalgias.  I will refer him to lipid clinic to start Repatha.  4. ETOH: I encouraged him to keep ETOH to a minimum, he rarely drinks now. .   5. Hyperlipidemia: Myalgias with atorvastatin  and Crestor .  - Refer to lipid clinic to start Repatha.  Goal LDL < 55.   Followup with APP after cath.    I spent 42 minutes reviewing records, interviewing/examining patient, and managing orders.   Signed, Harlene CHRISTELLA Gainer, FNP  01/17/2024  [facility] [address] [facility] [place] [address] [phone] (office) [phone] (fax)

## 2024-01-18 ENCOUNTER — Telehealth (HOSPITAL_COMMUNITY): Payer: Self-pay

## 2024-01-18 NOTE — Telephone Encounter (Signed)
 Called to confirm/remind patient of their appointment at the [facility] on 01/19/24.   Appointment:   [x] Confirmed  [] Left mess   [] No answer/No voice mail  [] VM Full/unable to leave message  [] Phone not in service  Patient reminded to bring all medications and/or complete list.  Confirmed patient has transportation. Gave directions, instructed to utilize valet parking.

## 2024-01-19 ENCOUNTER — Ambulatory Visit (HOSPITAL_COMMUNITY)
Admission: RE | Admit: 2024-01-19 | Discharge: 2024-01-19 | Disposition: A | Discharge status: Home / Self Care | Source: Ambulatory Visit | Attending: Family Medicine | Admitting: Family Medicine

## 2024-01-19 ENCOUNTER — Encounter (HOSPITAL_COMMUNITY): Payer: Self-pay

## 2024-01-19 ENCOUNTER — Ambulatory Visit (HOSPITAL_COMMUNITY): Payer: Self-pay | Admitting: Family Medicine

## 2024-01-19 ENCOUNTER — Telehealth (HOSPITAL_COMMUNITY): Payer: Self-pay | Admitting: *Deleted

## 2024-01-19 VITALS — BP 132/86 | HR 86 | Ht 68.5 in | Wt 186.4 lb

## 2024-01-19 DIAGNOSIS — E785 Hyperlipidemia, unspecified: Secondary | ICD-10-CM | POA: Diagnosis not present

## 2024-01-19 DIAGNOSIS — Z86711 Personal history of pulmonary embolism: Secondary | ICD-10-CM | POA: Diagnosis not present

## 2024-01-19 DIAGNOSIS — I2510 Atherosclerotic heart disease of native coronary artery without angina pectoris: Secondary | ICD-10-CM | POA: Insufficient documentation

## 2024-01-19 DIAGNOSIS — Z79899 Other long term (current) drug therapy: Secondary | ICD-10-CM | POA: Insufficient documentation

## 2024-01-19 DIAGNOSIS — F1010 Alcohol abuse, uncomplicated: Secondary | ICD-10-CM | POA: Diagnosis not present

## 2024-01-19 DIAGNOSIS — I429 Cardiomyopathy, unspecified: Secondary | ICD-10-CM | POA: Insufficient documentation

## 2024-01-19 DIAGNOSIS — I5022 Chronic systolic (congestive) heart failure: Secondary | ICD-10-CM | POA: Insufficient documentation

## 2024-01-19 DIAGNOSIS — Z7984 Long term (current) use of oral hypoglycemic drugs: Secondary | ICD-10-CM | POA: Insufficient documentation

## 2024-01-19 DIAGNOSIS — Z87891 Personal history of nicotine dependence: Secondary | ICD-10-CM | POA: Diagnosis not present

## 2024-01-19 DIAGNOSIS — Z955 Presence of coronary angioplasty implant and graft: Secondary | ICD-10-CM | POA: Insufficient documentation

## 2024-01-19 LAB — BASIC METABOLIC PANEL WITH GFR
Anion gap: 10 (ref 5–15)
BUN: 15 mg/dL (ref 6–20)
CO2: 25 mmol/L (ref 22–32)
Calcium: 9.5 mg/dL (ref 8.9–10.3)
Chloride: 99 mmol/L (ref 98–111)
Creatinine, Ser: 0.9 mg/dL (ref 0.61–1.24)
GFR, Estimated: >60 mL/min (ref >60)
Glucose, Bld: 167 mg/dL — ABNORMAL HIGH (ref 70–99)
Potassium: 5 mmol/L (ref 3.5–5.1)
Sodium: 134 mmol/L — ABNORMAL LOW (ref 135–145)

## 2024-01-19 NOTE — Addendum Note (Signed)
 Encounter addended by: Lucky Trotta M, RN on: 01/19/2024 8:57 AM  Actions taken: Charge Capture section accepted

## 2024-01-19 NOTE — Telephone Encounter (Signed)
 Chart reviewed. Patient appropriate for scheduling at cardiac rehab.Hadassah Elpidio Quan RN BSN

## 2024-01-19 NOTE — Patient Instructions (Addendum)
 Good to see you today!  You have been referred to  lipid clinic they will call you  Labs done today, your results will be available in MyChart, we will contact you for abnormal readings.  Your physician has requested that you have an echocardiogram. Echocardiography is a painless test that uses sound waves to create images of your heart. It provides your doctor with information about the size and shape of your heart and how well your heart's chambers and valves are working. This procedure takes approximately one hour. There are no restrictions for this procedure. Please do NOT wear cologne, perfume, aftershave, or lotions (deodorant is allowed). Please arrive 15 minutes prior to your appointment time.  Please note: We ask at that you not bring children with you during ultrasound (echo/ vascular) testing. Due to room size and safety concerns, children are not allowed in the ultrasound rooms during exams. Our front office staff cannot provide observation of children in our lobby area while testing is being conducted. An adult accompanying a patient to their appointment will only be allowed in the ultrasound room at the discretion of the ultrasound technician under special circumstances. We apologize for any inconvenience.  Your physician recommends that you schedule a follow-up appointment 4 months with echocardiogram  If you have any questions or concerns before your next appointment please send us  a message through [place] or call our office at [phone].    TO LEAVE A MESSAGE FOR THE NURSE SELECT OPTION 2, PLEASE LEAVE A MESSAGE INCLUDING: YOUR NAME DATE OF BIRTH CALL BACK NUMBER REASON FOR CALL**this is important as we prioritize the call backs  YOU WILL RECEIVE A CALL BACK THE SAME DAY AS LONG AS YOU CALL BEFORE 4:00 PM At the [facility], you and your health needs are our priority. As part of our continuing mission to provide you with exceptional heart care, we have  created designated Provider Care Teams. These Care Teams include your primary Cardiologist (physician) and Advanced Practice Providers (APPs- Physician Assistants and Nurse Practitioners) who all work together to provide you with the care you need, when you need it.   You may see any of the following providers on your designated Care Team at your next follow up: Dr Toribio Fuel Dr Ezra Shuck Dr. Ria Commander Dr. Morene Brownie Amy Lenetta, NP Caffie Shed, [place] [facility], [place] Beckey Coe, NP [place] Lee, NP Ellouise Class, NP Tinnie Redman, PharmD Jaun Bash, PharmD   Please be sure to bring in all your medications bottles to every appointment.    Thank you for choosing [facility]

## 2024-01-21 NOTE — Progress Notes (Unsigned)
 Patient ID: Matthew Green                 DOB: 1965-09-19                    MRN: [id number]      HPI: Matthew Green is a 58 y.o. male patient referred to lipid clinic by Matthew Green. PMH is significant for CAD, CHF, EtOH abuse, HLD, DM, HTN   Coronary calcium  score was done in 5/25, score was 1366 Agatston units (94th percentile)   Patient has tried atorvastatin  10 mg in the past. Was unable to tolerate it due to severe muscle pain and arthralgia. It was affecting his daily living activity and exercise capacity. So it was switched to  Crestor  5 mg he is able to tolerate somewhat but his resistance  exercise capacity is still limited on rosuvastatin  5 mg.  He was off of all the meds before May Lipid lab, we will get updated labs; will be going to lab tomorrow.  Reviewed options for lowering LDL cholesterol, including ezetimibe, PCSK-9 inhibitors, bempedoic acid and inclisiran.  Discussed mechanisms of action, dosing, side effects and potential decreases in LDL cholesterol.  Also reviewed cost information and potential options for patient assistance.   Current Medications: Crestor  5 mg daily fish oil 1 cap twice daily  Intolerances: Crestor  dose higher than 5 mg daily, atorvastatin  10 mg - myalgia and arthralgia  Risk Factors: CAD, CHF, EtOH abuse, HLD, DM, HTN, family hx of CAD and high cholesterol   LDL goal: <55 mg/dl and TG <849 mg /dl  Last lab RC 819, TG 712, HDL 27.80, LDL 95 while not on any therapy   Diet: no red meat  Chicken, fish, turkey- once a week fried but rest of the week  Couple salads per day with low fat low sugar dressing    Exercise: walks at work and takes dogs for run and walk - gets 10 K steps  Resistance exercise - none   Family History:  Both parents has high cholesterol problem and  Mother -HF died from MI at age 58  Brother- HF   Social History:  Alcohol: 3-4 per day  Smoking: none quit 10 years ago  Labs:  Lipid Panel     Component Value  Date/Time   CHOL 180 08/11/2023 0814   TRIG 287.0 (H) 08/11/2023 0814   HDL 27.80 (L) 08/11/2023 0814   CHOLHDL 6 08/11/2023 0814   VLDL 57.4 (H) 08/11/2023 0814   LDLCALC 95 08/11/2023 0814   LDLDIRECT 78.0 07/08/2022 0802    Past Medical History:  Diagnosis Date   CHF (congestive heart failure) (HCC)    Diabetes mellitus without complication (HCC)    Hyperlipidemia    Hypertension     Current Outpatient Medications on File Prior to Visit  Medication Sig Dispense Refill   Acetylcysteine (NAC PO) Take 1 tablet by mouth in the morning.     apixaban  (ELIQUIS ) 2.5 MG TABS tablet Take 1 tablet (2.5 mg total) by mouth 2 (two) times daily. Resume on 12/27/2023 evening 180 tablet 3   Blood Glucose Monitoring Suppl (CONTOUR NEXT MONITOR) w/Device KIT 1 Units by Does not apply route daily. Check blood sugars once daily Dx:E11.9 1 kit 0   carvedilol  (COREG ) 12.5 MG tablet TAKE 1 TABLET BY MOUTH TWICE A DAY WITH MEAL CALL OFFICE FOR YEARLY FOLLOW UP APPT 180 tablet 1   Cholecalciferol (VITAMIN D-3 PO) Take 1 tablet by mouth  in the morning.     clopidogrel (PLAVIX) 75 MG tablet Take 1 tablet (75 mg total) by mouth daily with breakfast. 90 tablet 2   Cyanocobalamin (VITAMIN B-12 PO) Take 1 tablet by mouth in the morning.     EPINEPHrine  0.3 mg/0.3 mL IJ SOAJ injection USE AS DIRECTED ON PACKAGE AS NEEDED FOR ALLERGIC REACTION 1 each 3   JARDIANCE  10 MG TABS tablet TAKE 1 TABLET BY MOUTH EVERY DAY 90 tablet 1   nitroGLYCERIN (NITROSTAT) 0.4 MG SL tablet Place 1 tablet (0.4 mg total) under the tongue every 5 (five) minutes as needed. 25 tablet 3   Omega-3 Fatty Acids (FISH OIL PO) Take 1 capsule by mouth in the morning and at bedtime.     rosuvastatin  (CRESTOR ) 5 MG tablet TAKE 1 TABLET (5 MG TOTAL) BY MOUTH DAILY. (Patient taking differently: Take 5 mg by mouth every evening.) 90 tablet 1   sacubitril -valsartan  (ENTRESTO ) 49-51 MG Take 1 tablet by mouth 2 (two) times daily. 60 tablet 3    spironolactone  (ALDACTONE ) 25 MG tablet TAKE 1 TABLET (25 MG TOTAL) BY MOUTH DAILY. 90 tablet 3   No current facility-administered medications on file prior to visit.    Allergies  Allergen Reactions   Alpha-Gal Hives, Itching and Nausea And Vomiting   Bovine (Beef) Protein-Containing Drug Products Hives, Itching and Nausea And Vomiting   Porcine (Pork) Protein-Containing Drug Products Hives, Itching and Nausea And Vomiting   Rosuvastatin      Myalgias (tolerates low doses)    Assessment/Plan:  1. Hyperlipidemia -  Problem  Hyperlipidemia   Hyperlipidemia Assessment:  LDL goal: < 55 mg/dl TG <849 mg/dl  last LDLc 95 mg/dl TG 712 mg/dl  (94/7973) Able to tolerates Crestor  5 mg somewhat as his exercise capacity is still limited due to muscle aches   Intolerance to high intensity statins  Discussed next potential options (Zetia, PCSK-9 inhibitors, bempedoic acid and inclisiran); cost, dosing efficacy, side effects  Reiterated importance of healthy lifestyle including  lowering alcohol consumption   Plan: Continue taking current medications (Crestor  5 mg daily) Will apply for PA for PCSK9i; will inform patient upon approval (prefers MyChart message) Lipid lab due in 2-3 months after starting PCSK9i    Thank you,  Matthew Green, Pharm.D [place] Matthew Green. [facility] [address], [place], [place] [address] Phone: [phone]; Fax: [phone]

## 2024-01-22 ENCOUNTER — Telehealth: Payer: Self-pay | Admitting: Pharmacy Technician

## 2024-01-22 ENCOUNTER — Telehealth (HOSPITAL_COMMUNITY): Payer: Self-pay

## 2024-01-22 ENCOUNTER — Telehealth: Payer: Self-pay | Admitting: Pharmacist

## 2024-01-22 ENCOUNTER — Encounter: Payer: Self-pay | Admitting: Pharmacist

## 2024-01-22 ENCOUNTER — Other Ambulatory Visit (HOSPITAL_COMMUNITY): Payer: Self-pay

## 2024-01-22 ENCOUNTER — Ambulatory Visit: Attending: Cardiology | Admitting: Pharmacist

## 2024-01-22 DIAGNOSIS — E7849 Other hyperlipidemia: Secondary | ICD-10-CM

## 2024-01-22 DIAGNOSIS — E781 Pure hyperglyceridemia: Secondary | ICD-10-CM | POA: Diagnosis not present

## 2024-01-22 NOTE — Telephone Encounter (Signed)
 Pharmacy Patient Advocate Encounter  Received notification from [facility] that Prior Authorization for Repatha has been APPROVED from 01/22/24 to 01/21/27. Ran test claim, Copay is $0.00- one month. This test claim was processed through [facility]- copay amounts may vary at other pharmacies due to pharmacy/plan contracts, or as the patient moves through the different stages of their insurance plan.   PA #/Case ID/Reference #: [id number]

## 2024-01-22 NOTE — Telephone Encounter (Signed)
 Pharmacy Patient Advocate Encounter   Received notification from Pt Calls Messages that prior authorization for Repatha is required/requested.   Insurance verification completed.   The patient is insured through [facility].   Per test claim: PA required; PA submitted to above mentioned insurance via Latent Key/confirmation #/EOC [id number] Status is pending

## 2024-01-22 NOTE — Telephone Encounter (Signed)
 Will assess coverage for [facility]

## 2024-01-22 NOTE — Assessment & Plan Note (Signed)
 Assessment:  LDL goal: < 55 mg/dl TG <849 mg/dl  last LDLc 95 mg/dl TG 712 mg/dl  (94/7973) Able to tolerates Crestor  5 mg somewhat as his exercise capacity is still limited due to muscle aches   Intolerance to high intensity statins  Discussed next potential options (Zetia, PCSK-9 inhibitors, bempedoic acid and inclisiran); cost, dosing efficacy, side effects  Reiterated importance of healthy lifestyle including  lowering alcohol consumption   Plan: Continue taking current medications (Crestor  5 mg daily) Will apply for PA for PCSK9i; will inform patient upon approval (prefers MyChart message) Lipid lab due in 2-3 months after starting PCSK9i

## 2024-01-22 NOTE — Telephone Encounter (Signed)
 Pt insurance is active and benefits verified through [employer]. Co-pay $0, DED $5,000/$5,000 met, out of pocket $9,200/$7,119.62 met, co-insurance 30%. No pre-authorization required. 01/22/2024 @ 10:04am, spoke with Ray, REF# [id number].  TCR/ICR? ICR Visit(date of service)limitation? No Can multiple codes be used on the same date of service/visit?(IF ITS A LIMIT) N/A  Is this a lifetime maximum or an annual maximum? Annual Has the member used any of these services to date? No Is there a time limit (weeks/months) on start of program and/or program completion? No

## 2024-01-22 NOTE — Patient Instructions (Signed)
 Medication changes:   Continue taking Crestor  5 mg daily and we will start the process to get PCSK9i ( Repatha or Praluent)  covered by your insurance.  Once the prior authorization is complete, we will call you to let you know and confirm pharmacy information.      Praluent is a cholesterol medication that improved your body's ability to get rid of bad cholesterol known as LDL. It can lower your LDL up to 60%. It is an injection that is given under the skin every 2 weeks. The most common side effects of Praluent include runny nose, symptoms of the common cold, rarely flu or flu-like symptoms, back/muscle pain in about 3-4% of the patients, and redness, pain, or bruising at the injection site.    Repatha is a cholesterol medication that improved your body's ability to get rid of bad cholesterol known as LDL. It can lower your LDL up to 60%! It is an injection that is given under the skin every 2 weeks. The medication often requires a prior authorization from your insurance company.  The most common side effects of Repatha include runny nose, symptoms of the common cold, rarely flu or flu-like symptoms, back/muscle pain in about 3-4% of the patients, and redness, pain, or bruising at the injection site.   Lab orders: We want to repeat labs after 2-3 months.  We will send you a lab order to remind you once we get closer to that time.

## 2024-01-22 NOTE — Addendum Note (Signed)
 Addended by: Landra Howze K on: 01/22/2024 11:10 AM   Modules accepted: Orders

## 2024-01-22 NOTE — Telephone Encounter (Signed)
 Attempted to call patient to schedule cardiac rehab- no answer, left message. Sent MyChart message.

## 2024-01-23 DIAGNOSIS — E781 Pure hyperglyceridemia: Secondary | ICD-10-CM | POA: Diagnosis not present

## 2024-01-23 DIAGNOSIS — E7849 Other hyperlipidemia: Secondary | ICD-10-CM | POA: Diagnosis not present

## 2024-01-23 LAB — LIPID PANEL
Chol/HDL Ratio: 5.2 ratio — ABNORMAL HIGH (ref 0.0–5.0)
Cholesterol, Total: 178 mg/dL (ref 100–199)
HDL: 34 mg/dL — ABNORMAL LOW (ref >39)
LDL Chol Calc (NIH): 106 mg/dL — ABNORMAL HIGH (ref 0–99)
Triglycerides: 220 mg/dL — ABNORMAL HIGH (ref 0–149)
VLDL Cholesterol Cal: 38 mg/dL (ref 5–40)

## 2024-01-24 ENCOUNTER — Ambulatory Visit: Payer: Self-pay | Admitting: Pharmacist

## 2024-01-24 LAB — LIPOPROTEIN A (LPA): Lipoprotein (a): 174.1 nmol/L — ABNORMAL HIGH (ref <75.0)

## 2024-01-24 MED ORDER — REPATHA SURECLICK 140 MG/ML ~~LOC~~ SOAJ: 140.0000 mg | SUBCUTANEOUS | 3 refills | Status: AC

## 2024-01-24 NOTE — Addendum Note (Signed)
 Addended by: Mercades Bajaj K on: 01/24/2024 01:04 PM   Modules accepted: Orders

## 2024-01-24 NOTE — Telephone Encounter (Signed)
 Result discussed over the phone. Repatha prescription sent to [facility]. Will repeat lab end of the Jan 2026.

## 2024-02-08 ENCOUNTER — Telehealth (HOSPITAL_COMMUNITY): Payer: Self-pay

## 2024-02-08 ENCOUNTER — Other Ambulatory Visit (HOSPITAL_COMMUNITY): Payer: Self-pay | Admitting: Cardiology

## 2024-02-08 ENCOUNTER — Other Ambulatory Visit: Payer: Self-pay | Admitting: Family Medicine

## 2024-02-08 NOTE — Telephone Encounter (Signed)
No response from pt in regards to Cardiac Rehab. Closed referral               

## 2024-03-08 ENCOUNTER — Encounter: Payer: Self-pay | Admitting: Family Medicine

## 2024-03-08 ENCOUNTER — Ambulatory Visit: Admitting: Family Medicine

## 2024-03-08 VITALS — BP 124/78 | HR 87 | Temp 97.7°F | Ht 68.5 in | Wt 178.2 lb

## 2024-03-08 DIAGNOSIS — E119 Type 2 diabetes mellitus without complications: Secondary | ICD-10-CM

## 2024-03-08 DIAGNOSIS — Z7984 Long term (current) use of oral hypoglycemic drugs: Secondary | ICD-10-CM | POA: Diagnosis not present

## 2024-03-08 LAB — BASIC METABOLIC PANEL WITH GFR
BUN: 15 mg/dL (ref 6–23)
CO2: 24 meq/L (ref 19–32)
Calcium: 9.4 mg/dL (ref 8.4–10.5)
Chloride: 102 meq/L (ref 96–112)
Creatinine, Ser: 0.88 mg/dL (ref 0.40–1.50)
GFR: 93.49 mL/min (ref >60.00)
Glucose, Bld: 168 mg/dL — ABNORMAL HIGH (ref 70–99)
Potassium: 4.2 meq/L (ref 3.5–5.1)
Sodium: 136 meq/L (ref 135–145)

## 2024-03-08 LAB — HEMOGLOBIN A1C: Hgb A1c MFr Bld: 6.5 % (ref 4.6–6.5)

## 2024-03-08 NOTE — Assessment & Plan Note (Signed)
 Chronic problem.  On Jardiance  10mg  daily w/o difficulty.  UTD on foot exam, eye exam, microalbumin.  Currently asymptomatic.  Check labs.  Adjust meds prn

## 2024-03-08 NOTE — Progress Notes (Signed)
° °  Subjective:    Patient ID: Matthew Green, male    DOB: 01/26/1966, 58 y.o.   MRN: [id number]  HPI DM- ongoing issue, currently on Jardiance  10mg  daily.  UTD on foot exam, eye exam, microalbumin.  Last A1C 6.6%  Pt had recent PCI and is doing well.  Reports feeling great.  No CP, SOB, HA's, visual changes, abd pain, N/V.  No numbness/tingling of hands/feet.   Review of Systems For ROS see HPI     Objective:   Physical Exam Vitals reviewed.  Constitutional:      General: He is not in acute distress.    Appearance: Normal appearance. He is well-developed. He is not ill-appearing.  HENT:     Head: Normocephalic and atraumatic.  Eyes:     Extraocular Movements: Extraocular movements intact.     Conjunctiva/sclera: Conjunctivae normal.     Pupils: Pupils are equal, round, and reactive to light.  Neck:     Thyroid : No thyromegaly.  Cardiovascular:     Rate and Rhythm: Normal rate and regular rhythm.     Pulses: Normal pulses.     Heart sounds: Normal heart sounds. No murmur heard. Pulmonary:     Effort: Pulmonary effort is normal. No respiratory distress.     Breath sounds: Normal breath sounds.  Abdominal:     General: Bowel sounds are normal. There is no distension.     Palpations: Abdomen is soft.  Musculoskeletal:     Cervical back: Normal range of motion and neck supple.     Right lower leg: No edema.     Left lower leg: No edema.  Lymphadenopathy:     Cervical: No cervical adenopathy.  Skin:    General: Skin is warm and dry.  Neurological:     General: No focal deficit present.     Mental Status: He is alert and oriented to person, place, and time.     Cranial Nerves: No cranial nerve deficit.  Psychiatric:        Mood and Affect: Mood normal.        Behavior: Behavior normal.           Assessment & Plan:

## 2024-03-08 NOTE — Patient Instructions (Signed)
Follow up in 3-4 months to recheck sugar We'll notify you of your lab results and make any changes if needed Continue to work on healthy diet and regular exercise- you're doing great!!! Call with any questions or concerns Stay Safe!  Stay Healthy! Happy Holidays!!

## 2024-03-11 ENCOUNTER — Ambulatory Visit: Payer: Self-pay | Admitting: Family Medicine

## 2024-03-18 ENCOUNTER — Other Ambulatory Visit (HOSPITAL_COMMUNITY): Payer: Self-pay | Admitting: Cardiology

## 2024-05-09 ENCOUNTER — Ambulatory Visit (HOSPITAL_COMMUNITY)

## 2024-06-21 ENCOUNTER — Ambulatory Visit: Admitting: Family Medicine
